# Patient Record
Sex: Female | Born: 1935 | Hispanic: No | Smoking: Never smoker
Health system: Southern US, Community
[De-identification: ages and names within clinical notes are randomized; demographics above are authoritative.]

## PROBLEM LIST (undated history)

## (undated) DIAGNOSIS — E785 Hyperlipidemia, unspecified: Secondary | ICD-10-CM

## (undated) DIAGNOSIS — E119 Type 2 diabetes mellitus without complications: Secondary | ICD-10-CM

## (undated) DIAGNOSIS — I63512 Cerebral infarction due to unspecified occlusion or stenosis of left middle cerebral artery: Secondary | ICD-10-CM

## (undated) DIAGNOSIS — I10 Essential (primary) hypertension: Secondary | ICD-10-CM

## (undated) DIAGNOSIS — M1990 Unspecified osteoarthritis, unspecified site: Secondary | ICD-10-CM

## (undated) HISTORY — PX: LIVER SURGERY: SHX698

## (undated) HISTORY — DX: Hyperlipidemia, unspecified: E78.5

## (undated) HISTORY — DX: Essential (primary) hypertension: I10

## (undated) HISTORY — DX: Type 2 diabetes mellitus without complications: E11.9

## (undated) HISTORY — DX: Unspecified osteoarthritis, unspecified site: M19.90

## (undated) HISTORY — DX: Cerebral infarction due to unspecified occlusion or stenosis of left middle cerebral artery: I63.512

---

## 2011-12-24 DIAGNOSIS — M159 Polyosteoarthritis, unspecified: Secondary | ICD-10-CM | POA: Diagnosis not present

## 2011-12-24 DIAGNOSIS — E119 Type 2 diabetes mellitus without complications: Secondary | ICD-10-CM | POA: Diagnosis not present

## 2012-01-30 DIAGNOSIS — H2510 Age-related nuclear cataract, unspecified eye: Secondary | ICD-10-CM | POA: Diagnosis not present

## 2012-01-30 DIAGNOSIS — E11329 Type 2 diabetes mellitus with mild nonproliferative diabetic retinopathy without macular edema: Secondary | ICD-10-CM | POA: Diagnosis not present

## 2012-01-30 DIAGNOSIS — E1139 Type 2 diabetes mellitus with other diabetic ophthalmic complication: Secondary | ICD-10-CM | POA: Diagnosis not present

## 2012-03-04 DIAGNOSIS — H40039 Anatomical narrow angle, unspecified eye: Secondary | ICD-10-CM | POA: Diagnosis not present

## 2012-03-04 DIAGNOSIS — H11159 Pinguecula, unspecified eye: Secondary | ICD-10-CM | POA: Diagnosis not present

## 2012-03-04 DIAGNOSIS — H25019 Cortical age-related cataract, unspecified eye: Secondary | ICD-10-CM | POA: Diagnosis not present

## 2012-03-04 DIAGNOSIS — H526 Other disorders of refraction: Secondary | ICD-10-CM | POA: Diagnosis not present

## 2012-03-04 DIAGNOSIS — H2510 Age-related nuclear cataract, unspecified eye: Secondary | ICD-10-CM | POA: Diagnosis not present

## 2012-03-13 DIAGNOSIS — I10 Essential (primary) hypertension: Secondary | ICD-10-CM | POA: Diagnosis not present

## 2012-03-13 DIAGNOSIS — I4510 Unspecified right bundle-branch block: Secondary | ICD-10-CM | POA: Diagnosis not present

## 2012-03-30 DIAGNOSIS — H2510 Age-related nuclear cataract, unspecified eye: Secondary | ICD-10-CM | POA: Diagnosis not present

## 2012-03-30 DIAGNOSIS — H25019 Cortical age-related cataract, unspecified eye: Secondary | ICD-10-CM | POA: Diagnosis not present

## 2012-03-30 DIAGNOSIS — H269 Unspecified cataract: Secondary | ICD-10-CM | POA: Diagnosis not present

## 2012-04-13 DIAGNOSIS — H52209 Unspecified astigmatism, unspecified eye: Secondary | ICD-10-CM | POA: Diagnosis not present

## 2012-04-13 DIAGNOSIS — H269 Unspecified cataract: Secondary | ICD-10-CM | POA: Diagnosis not present

## 2012-04-13 DIAGNOSIS — H25019 Cortical age-related cataract, unspecified eye: Secondary | ICD-10-CM | POA: Diagnosis not present

## 2012-04-13 DIAGNOSIS — H2510 Age-related nuclear cataract, unspecified eye: Secondary | ICD-10-CM | POA: Diagnosis not present

## 2012-04-13 DIAGNOSIS — K219 Gastro-esophageal reflux disease without esophagitis: Secondary | ICD-10-CM | POA: Diagnosis not present

## 2012-06-25 DIAGNOSIS — Z0000 Encounter for general adult medical examination without abnormal findings: Secondary | ICD-10-CM | POA: Diagnosis not present

## 2012-06-25 DIAGNOSIS — E119 Type 2 diabetes mellitus without complications: Secondary | ICD-10-CM | POA: Diagnosis not present

## 2012-06-25 DIAGNOSIS — M129 Arthropathy, unspecified: Secondary | ICD-10-CM | POA: Diagnosis not present

## 2012-08-10 DIAGNOSIS — E119 Type 2 diabetes mellitus without complications: Secondary | ICD-10-CM | POA: Diagnosis not present

## 2012-08-21 DIAGNOSIS — E119 Type 2 diabetes mellitus without complications: Secondary | ICD-10-CM | POA: Diagnosis not present

## 2012-08-21 DIAGNOSIS — R002 Palpitations: Secondary | ICD-10-CM | POA: Diagnosis not present

## 2012-08-25 DIAGNOSIS — R002 Palpitations: Secondary | ICD-10-CM | POA: Diagnosis not present

## 2012-08-27 DIAGNOSIS — R42 Dizziness and giddiness: Secondary | ICD-10-CM | POA: Diagnosis not present

## 2012-08-27 DIAGNOSIS — R002 Palpitations: Secondary | ICD-10-CM | POA: Diagnosis not present

## 2012-08-27 DIAGNOSIS — E119 Type 2 diabetes mellitus without complications: Secondary | ICD-10-CM | POA: Diagnosis not present

## 2012-08-27 DIAGNOSIS — I10 Essential (primary) hypertension: Secondary | ICD-10-CM | POA: Diagnosis not present

## 2012-08-27 DIAGNOSIS — R079 Chest pain, unspecified: Secondary | ICD-10-CM | POA: Diagnosis not present

## 2012-09-07 DIAGNOSIS — R0789 Other chest pain: Secondary | ICD-10-CM | POA: Diagnosis not present

## 2012-09-07 DIAGNOSIS — R002 Palpitations: Secondary | ICD-10-CM | POA: Diagnosis not present

## 2012-09-08 DIAGNOSIS — R0602 Shortness of breath: Secondary | ICD-10-CM | POA: Diagnosis not present

## 2012-09-08 DIAGNOSIS — R002 Palpitations: Secondary | ICD-10-CM | POA: Diagnosis not present

## 2012-09-08 DIAGNOSIS — R0789 Other chest pain: Secondary | ICD-10-CM | POA: Diagnosis not present

## 2012-09-11 ENCOUNTER — Ambulatory Visit: Payer: Self-pay | Admitting: Cardiovascular Disease

## 2012-09-26 DIAGNOSIS — Z23 Encounter for immunization: Secondary | ICD-10-CM | POA: Diagnosis not present

## 2012-09-29 DIAGNOSIS — R42 Dizziness and giddiness: Secondary | ICD-10-CM | POA: Diagnosis not present

## 2012-10-26 DIAGNOSIS — E119 Type 2 diabetes mellitus without complications: Secondary | ICD-10-CM | POA: Diagnosis not present

## 2012-12-25 DIAGNOSIS — E119 Type 2 diabetes mellitus without complications: Secondary | ICD-10-CM | POA: Diagnosis not present

## 2012-12-25 DIAGNOSIS — M79609 Pain in unspecified limb: Secondary | ICD-10-CM | POA: Diagnosis not present

## 2012-12-25 DIAGNOSIS — R634 Abnormal weight loss: Secondary | ICD-10-CM | POA: Diagnosis not present

## 2012-12-29 DIAGNOSIS — R634 Abnormal weight loss: Secondary | ICD-10-CM | POA: Diagnosis not present

## 2012-12-29 DIAGNOSIS — R718 Other abnormality of red blood cells: Secondary | ICD-10-CM | POA: Diagnosis not present

## 2012-12-29 DIAGNOSIS — R6889 Other general symptoms and signs: Secondary | ICD-10-CM | POA: Diagnosis not present

## 2013-01-21 DIAGNOSIS — M79609 Pain in unspecified limb: Secondary | ICD-10-CM | POA: Diagnosis not present

## 2013-01-21 DIAGNOSIS — E119 Type 2 diabetes mellitus without complications: Secondary | ICD-10-CM | POA: Diagnosis not present

## 2013-07-07 DIAGNOSIS — E785 Hyperlipidemia, unspecified: Secondary | ICD-10-CM | POA: Diagnosis not present

## 2013-07-07 DIAGNOSIS — E119 Type 2 diabetes mellitus without complications: Secondary | ICD-10-CM | POA: Diagnosis not present

## 2013-07-07 DIAGNOSIS — Z23 Encounter for immunization: Secondary | ICD-10-CM | POA: Diagnosis not present

## 2013-07-07 DIAGNOSIS — D649 Anemia, unspecified: Secondary | ICD-10-CM | POA: Diagnosis not present

## 2013-07-12 DIAGNOSIS — R634 Abnormal weight loss: Secondary | ICD-10-CM | POA: Diagnosis not present

## 2013-07-12 DIAGNOSIS — R718 Other abnormality of red blood cells: Secondary | ICD-10-CM | POA: Diagnosis not present

## 2013-07-12 DIAGNOSIS — D649 Anemia, unspecified: Secondary | ICD-10-CM | POA: Diagnosis not present

## 2013-07-12 DIAGNOSIS — E785 Hyperlipidemia, unspecified: Secondary | ICD-10-CM | POA: Diagnosis not present

## 2013-07-12 DIAGNOSIS — E119 Type 2 diabetes mellitus without complications: Secondary | ICD-10-CM | POA: Diagnosis not present

## 2013-07-12 DIAGNOSIS — R6889 Other general symptoms and signs: Secondary | ICD-10-CM | POA: Diagnosis not present

## 2013-09-09 DIAGNOSIS — M129 Arthropathy, unspecified: Secondary | ICD-10-CM | POA: Diagnosis not present

## 2013-09-09 DIAGNOSIS — E119 Type 2 diabetes mellitus without complications: Secondary | ICD-10-CM | POA: Diagnosis not present

## 2013-10-25 DIAGNOSIS — E119 Type 2 diabetes mellitus without complications: Secondary | ICD-10-CM | POA: Diagnosis not present

## 2014-03-08 DIAGNOSIS — M79609 Pain in unspecified limb: Secondary | ICD-10-CM | POA: Diagnosis not present

## 2014-03-18 DIAGNOSIS — B0089 Other herpesviral infection: Secondary | ICD-10-CM | POA: Diagnosis not present

## 2014-03-18 DIAGNOSIS — L02619 Cutaneous abscess of unspecified foot: Secondary | ICD-10-CM | POA: Diagnosis not present

## 2014-03-18 DIAGNOSIS — M25579 Pain in unspecified ankle and joints of unspecified foot: Secondary | ICD-10-CM | POA: Diagnosis not present

## 2014-03-18 DIAGNOSIS — L03119 Cellulitis of unspecified part of limb: Secondary | ICD-10-CM | POA: Diagnosis not present

## 2014-03-18 DIAGNOSIS — E119 Type 2 diabetes mellitus without complications: Secondary | ICD-10-CM | POA: Diagnosis not present

## 2014-03-18 DIAGNOSIS — M79609 Pain in unspecified limb: Secondary | ICD-10-CM | POA: Diagnosis not present

## 2014-05-10 DIAGNOSIS — Z9181 History of falling: Secondary | ICD-10-CM | POA: Diagnosis not present

## 2014-05-10 DIAGNOSIS — M129 Arthropathy, unspecified: Secondary | ICD-10-CM | POA: Diagnosis not present

## 2014-05-10 DIAGNOSIS — L97509 Non-pressure chronic ulcer of other part of unspecified foot with unspecified severity: Secondary | ICD-10-CM | POA: Diagnosis not present

## 2014-05-10 DIAGNOSIS — E119 Type 2 diabetes mellitus without complications: Secondary | ICD-10-CM | POA: Diagnosis not present

## 2014-05-10 LAB — HEMOGLOBIN A1C: Hgb A1c MFr Bld: 6.5 % — AB (ref 4.0–6.0)

## 2014-07-11 DIAGNOSIS — L02619 Cutaneous abscess of unspecified foot: Secondary | ICD-10-CM | POA: Diagnosis not present

## 2014-07-11 DIAGNOSIS — M25569 Pain in unspecified knee: Secondary | ICD-10-CM | POA: Diagnosis not present

## 2014-07-12 DIAGNOSIS — M2550 Pain in unspecified joint: Secondary | ICD-10-CM | POA: Diagnosis not present

## 2014-07-12 DIAGNOSIS — IMO0001 Reserved for inherently not codable concepts without codable children: Secondary | ICD-10-CM | POA: Diagnosis not present

## 2014-07-12 DIAGNOSIS — E559 Vitamin D deficiency, unspecified: Secondary | ICD-10-CM | POA: Diagnosis not present

## 2014-07-12 DIAGNOSIS — Z0000 Encounter for general adult medical examination without abnormal findings: Secondary | ICD-10-CM | POA: Diagnosis not present

## 2014-11-11 DIAGNOSIS — E118 Type 2 diabetes mellitus with unspecified complications: Secondary | ICD-10-CM | POA: Diagnosis not present

## 2014-11-11 DIAGNOSIS — I10 Essential (primary) hypertension: Secondary | ICD-10-CM | POA: Diagnosis not present

## 2015-04-15 DIAGNOSIS — M1990 Unspecified osteoarthritis, unspecified site: Secondary | ICD-10-CM | POA: Insufficient documentation

## 2015-04-15 DIAGNOSIS — R634 Abnormal weight loss: Secondary | ICD-10-CM | POA: Insufficient documentation

## 2015-04-15 DIAGNOSIS — E119 Type 2 diabetes mellitus without complications: Secondary | ICD-10-CM

## 2015-04-17 ENCOUNTER — Ambulatory Visit (INDEPENDENT_AMBULATORY_CARE_PROVIDER_SITE_OTHER): Payer: Medicare Other | Admitting: Family Medicine

## 2015-04-17 ENCOUNTER — Encounter: Payer: Self-pay | Admitting: Family Medicine

## 2015-04-17 ENCOUNTER — Ambulatory Visit: Payer: Self-pay | Admitting: Family Medicine

## 2015-04-17 VITALS — BP 155/60 | HR 60 | Temp 98.0°F | Resp 16 | Ht <= 58 in | Wt 109.6 lb

## 2015-04-17 DIAGNOSIS — M79609 Pain in unspecified limb: Secondary | ICD-10-CM | POA: Insufficient documentation

## 2015-04-17 DIAGNOSIS — E119 Type 2 diabetes mellitus without complications: Secondary | ICD-10-CM

## 2015-04-17 DIAGNOSIS — R718 Other abnormality of red blood cells: Secondary | ICD-10-CM | POA: Insufficient documentation

## 2015-04-17 DIAGNOSIS — L97509 Non-pressure chronic ulcer of other part of unspecified foot with unspecified severity: Secondary | ICD-10-CM | POA: Insufficient documentation

## 2015-04-17 DIAGNOSIS — I10 Essential (primary) hypertension: Secondary | ICD-10-CM

## 2015-04-17 LAB — CBC WITH DIFFERENTIAL/PLATELET
Basophils Absolute: 0 10*3/uL (ref 0.0–0.2)
Basos: 1 %
EOS (ABSOLUTE): 0 10*3/uL (ref 0.0–0.4)
Eos: 1 %
Hematocrit: 31.9 % — ABNORMAL LOW (ref 34.0–46.6)
Hemoglobin: 10.3 g/dL — ABNORMAL LOW (ref 11.1–15.9)
Immature Grans (Abs): 0 10*3/uL (ref 0.0–0.1)
Immature Granulocytes: 0 %
Lymphocytes Absolute: 1.7 10*3/uL (ref 0.7–3.1)
Lymphs: 43 %
MCH: 20.2 pg — ABNORMAL LOW (ref 26.6–33.0)
MCHC: 32.3 g/dL (ref 31.5–35.7)
MCV: 63 fL — ABNORMAL LOW (ref 79–97)
Monocytes Absolute: 0.3 10*3/uL (ref 0.1–0.9)
Monocytes: 6 %
Neutrophils Absolute: 2 10*3/uL (ref 1.4–7.0)
Neutrophils: 49 %
Platelets: 237 10*3/uL (ref 150–379)
RBC: 5.09 x10E6/uL (ref 3.77–5.28)
RDW: 17.2 % — ABNORMAL HIGH (ref 12.3–15.4)
WBC: 4.1 10*3/uL (ref 3.4–10.8)

## 2015-04-17 LAB — POCT GLYCOSYLATED HEMOGLOBIN (HGB A1C): Hemoglobin A1C: 6.1

## 2015-04-17 MED ORDER — LISINOPRIL 2.5 MG PO TABS: 2.5000 mg | ORAL_TABLET | Freq: Every day | ORAL | Status: DC

## 2015-04-17 NOTE — Progress Notes (Signed)
This encounter was created in error - please disregard.

## 2015-04-17 NOTE — Progress Notes (Signed)
Name: Gabriela Robles   MRN: [id number]    DOB: Jan 22, 1929   Date:04/17/2015       Progress Note  Subjective  Chief Complaint  Chief Complaint  Patient presents with  . Diabetes    Does not check at home.   . Leg Pain    Both knees    HPI  Here for f/u of T2DM.   She has no c/o.  She is [name].  Here with daughter.  Takes Fish Oil only.   Past Medical History  Diagnosis Date  . Arthritis   . Diabetes mellitus without complication     History reviewed. No pertinent past surgical history.  Family History  Problem Relation Age of Onset  . Family history unknown: Yes    History   Social History  . Marital Status: Unknown    Spouse Name: N/A  . Number of Children: N/A  . Years of Education: N/A   Occupational History  . Not on file.   Social History Main Topics  . Smoking status: Never Smoker   . Smokeless tobacco: Not on file  . Alcohol Use: No  . Drug Use: No  . Sexual Activity: Not on file   Other Topics Concern  . Not on file   Social History Narrative     Current outpatient prescriptions:  .  Omega-3 Fatty Acids (FISH OIL) 1000 MG CPDR, Take by mouth., Disp: , Rfl:  .  lisinopril (ZESTRIL) 2.5 MG tablet, Take 1 tablet (2.5 mg total) by mouth daily., Disp: 30 tablet, Rfl: 12 .  meloxicam (MOBIC) 7.5 MG tablet, Take by mouth., Disp: , Rfl:  .  MULTIPLE VITAMINS-MINERALS ER PO, Take by mouth., Disp: , Rfl:   No Known Allergies   Review of Systems  Constitutional: Negative.  Negative for fever, chills and malaise/fatigue.  HENT: Negative.   Eyes: Negative.  Negative for blurred vision and double vision.  Respiratory: Negative.  Negative for cough, shortness of breath and wheezing.   Cardiovascular: Negative.  Negative for chest pain, palpitations, orthopnea and leg swelling.  Gastrointestinal: Negative.  Negative for abdominal pain and blood in stool.  Genitourinary: Negative.  Negative for dysuria, urgency and frequency.  Musculoskeletal:  Negative.  Negative for myalgias and joint pain.  Skin: Negative.  Negative for rash.  Neurological: Negative for weakness and headaches.      Objective  Filed Vitals:   04/17/15 0928 04/17/15 0953  BP: 154/62 155/60  Pulse: 48 60  Temp: 98 F (36.7 C)   Resp: 16   Height: 4' 9.5" (1.461 m)   Weight: 109 lb 9.6 oz (49.714 kg)     Physical Exam  Constitutional: She is oriented to person, place, and time and well-developed, well-nourished, and in no distress. No distress.  HENT:  Head: Normocephalic and atraumatic.  Eyes: Conjunctivae and EOM are normal. Pupils are equal, round, and reactive to light. No scleral icterus.  Neck: Normal range of motion. Neck supple. No thyromegaly present.  Cardiovascular: Normal rate, regular rhythm, normal heart sounds and intact distal pulses.  Exam reveals no gallop and no friction rub.   No murmur heard. Pulmonary/Chest: Effort normal and breath sounds normal. No respiratory distress. She has no wheezes. She exhibits no tenderness.  Abdominal: Soft. Bowel sounds are normal. She exhibits no mass. There is no tenderness.  Musculoskeletal: She exhibits no edema.  Lymphadenopathy:    She has no cervical adenopathy.  Neurological: She is alert and oriented to person, place, and time.  Skin: Skin is warm and dry.  Vitals reviewed.        Assessment & Plan  Problem List Items Addressed This Visit      Cardiovascular and Mediastinum   Hypertension   Relevant Medications   lisinopril (ZESTRIL) 2.5 MG tablet   Other Relevant Orders   CBC with Differential     Endocrine   Diabetes - Primary   Relevant Medications   lisinopril (ZESTRIL) 2.5 MG tablet   Other Relevant Orders   POCT A1C (Completed)   Comprehensive Metabolic Panel (CMET)   Lipid Profile      Meds ordered this encounter  Medications         .       Marland Kitchen Omega-3 Fatty Acids (FISH OIL) 1000 MG CPDR    Sig: Take by mouth.  Marland Kitchen lisinopril (ZESTRIL) 2.5 MG tablet     Sig: Take 1 tablet (2.5 mg total) by mouth daily.    Dispense:  30 tablet    Refill:  12   1. Type 2 diabetes mellitus without complication  - POCT A1C - Comprehensive Metabolic Panel (CMET) - Lipid Profile  2. Essential hypertension  - lisinopril (ZESTRIL) 2.5 MG tablet; Take 1 tablet (2.5 mg total) by mouth daily.  Dispense: 30 tablet; Refill: 12 - CBC with Differential

## 2015-04-18 LAB — COMPREHENSIVE METABOLIC PANEL
ALT: 9 IU/L (ref 0–32)
AST: 15 IU/L (ref 0–40)
Albumin/Globulin Ratio: 1.6 (ref 1.1–2.5)
Albumin: 4 g/dL (ref 3.5–4.7)
Alkaline Phosphatase: 47 IU/L (ref 39–117)
BUN/Creatinine Ratio: 34 — ABNORMAL HIGH (ref 11–26)
BUN: 25 mg/dL (ref 8–27)
Bilirubin Total: 0.5 mg/dL (ref 0.0–1.2)
CO2: 25 mmol/L (ref 18–29)
Calcium: 9.2 mg/dL (ref 8.7–10.3)
Chloride: 103 mmol/L (ref 97–108)
Creatinine, Ser: 0.74 mg/dL (ref 0.57–1.00)
GFR calc Af Amer: 87 mL/min/{1.73_m2} (ref >59)
GFR calc non Af Amer: 75 mL/min/{1.73_m2} (ref >59)
Globulin, Total: 2.5 g/dL (ref 1.5–4.5)
Glucose: 96 mg/dL (ref 65–99)
Potassium: 4.3 mmol/L (ref 3.5–5.2)
Sodium: 143 mmol/L (ref 134–144)
Total Protein: 6.5 g/dL (ref 6.0–8.5)

## 2015-04-18 LAB — LIPID PANEL
Chol/HDL Ratio: 3.8 ratio units (ref 0.0–4.4)
Cholesterol, Total: 209 mg/dL — ABNORMAL HIGH (ref 100–199)
HDL: 55 mg/dL (ref >39)
LDL Calculated: 127 mg/dL — ABNORMAL HIGH (ref 0–99)
Triglycerides: 137 mg/dL (ref 0–149)
VLDL Cholesterol Cal: 27 mg/dL (ref 5–40)

## 2015-04-18 NOTE — Progress Notes (Signed)
Advised.JH  

## 2015-07-24 ENCOUNTER — Ambulatory Visit (INDEPENDENT_AMBULATORY_CARE_PROVIDER_SITE_OTHER): Payer: Medicare Other | Admitting: Family Medicine

## 2015-07-24 ENCOUNTER — Encounter: Payer: Self-pay | Admitting: Family Medicine

## 2015-07-24 VITALS — BP 145/65 | HR 51 | Resp 16 | Ht 60.0 in | Wt 112.0 lb

## 2015-07-24 DIAGNOSIS — D508 Other iron deficiency anemias: Secondary | ICD-10-CM | POA: Diagnosis not present

## 2015-07-24 DIAGNOSIS — D509 Iron deficiency anemia, unspecified: Secondary | ICD-10-CM | POA: Insufficient documentation

## 2015-07-24 DIAGNOSIS — E119 Type 2 diabetes mellitus without complications: Secondary | ICD-10-CM

## 2015-07-24 DIAGNOSIS — I10 Essential (primary) hypertension: Secondary | ICD-10-CM | POA: Diagnosis not present

## 2015-07-24 DIAGNOSIS — Z23 Encounter for immunization: Secondary | ICD-10-CM

## 2015-07-24 DIAGNOSIS — D649 Anemia, unspecified: Secondary | ICD-10-CM | POA: Insufficient documentation

## 2015-07-24 MED ORDER — FERROUS SULFATE 325 (65 FE) MG PO TABS: 325.0000 mg | ORAL_TABLET | Freq: Every day | ORAL | Status: DC

## 2015-07-24 MED ORDER — LISINOPRIL 2.5 MG PO TABS: 2.5000 mg | ORAL_TABLET | Freq: Every day | ORAL | Status: DC

## 2015-07-24 NOTE — Patient Instructions (Signed)
Take OTC Ferrous sulfate, 325 mg., 1 daily.  She has had flu shot today.

## 2015-07-24 NOTE — Progress Notes (Signed)
Name: Gabriela Robles   MRN: [id number]    DOB: 08/27/1929   Date:07/24/2015       Progress Note  Subjective  Chief Complaint  Chief Complaint  Patient presents with  . Hypertension  . Toe Pain    spot on 4th Metatarsal    HPI Here for f/u of HBP.  Has DM also, controlled with diet.  Has run out or her BP med x 2 months.  Was found to be anemic on labwork. Has a sore toe on R foot.  She is Gabriela Robles.  Here with son today as interpretor.  Declines outside interprator.  No problem-specific assessment & plan notes found for this encounter.   Past Medical History  Diagnosis Date  . Arthritis   . Diabetes mellitus without complication     Social History  Substance Use Topics  . Smoking status: Never Smoker   . Smokeless tobacco: Never Used  . Alcohol Use: No     Current outpatient prescriptions:  .  lisinopril (ZESTRIL) 2.5 MG tablet, Take 1 tablet (2.5 mg total) by mouth daily., Disp: 30 tablet, Rfl: 12 .  meloxicam (MOBIC) 7.5 MG tablet, Take by mouth., Disp: , Rfl:  .  MULTIPLE VITAMINS-MINERALS ER PO, Take by mouth., Disp: , Rfl:  .  Omega-3 Fatty Acids (FISH OIL) 1000 MG CPDR, Take by mouth., Disp: , Rfl:   No Known Allergies  Review of Systems  Constitutional: Negative for fever and chills.  Respiratory: Negative for cough, shortness of breath and wheezing.   Cardiovascular: Negative for chest pain, palpitations and leg swelling.  Gastrointestinal: Negative for heartburn, abdominal pain and blood in stool.  Genitourinary: Negative for dysuria, urgency and frequency.  Neurological: Negative for dizziness, sensory change and focal weakness.      Objective  Filed Vitals:   07/24/15 1056  BP: 150/71  Pulse: 51  Resp: 16  Height: 5' (1.524 m)  Weight: 112 lb (50.803 kg)     Physical Exam  Constitutional: She is well-developed, well-nourished, and in no distress. No distress.  HENT:  Head: Normocephalic and atraumatic.  Neck: Normal range of motion. Neck  supple. Carotid bruit is not present. No thyromegaly present.  Cardiovascular: Normal rate, regular rhythm, normal heart sounds and intact distal pulses.  Exam reveals no gallop and no friction rub.   No murmur heard. Pulmonary/Chest: Effort normal and breath sounds normal. No respiratory distress. She has no wheezes. She has no rales.  Musculoskeletal: She exhibits no edema.  Lymphadenopathy:    She has no cervical adenopathy.  Skin:  She has a corn on her R 4th toe.  No cellulitis.  Minimal tenderness.  Vitals reviewed.     No results found for this or any previous visit (from the past 2160 hour(s)).   Assessment & Plan  1. Essential hypertension  - lisinopril (ZESTRIL) 2.5 MG tablet; Take 1 tablet (2.5 mg total) by mouth daily.  Dispense: 30 tablet; Refill: 12  2. Need for influenza vaccination  - Flu vaccine HIGH DOSE PF (Fluzone High dose)  3. Other iron deficiency anemias -OTC Fe SO4, 325 mg., 1 daily  4. Type 2 diabetes mellitus without complication Cont. Diet control

## 2015-09-12 ENCOUNTER — Telehealth: Payer: Self-pay | Admitting: *Deleted

## 2015-09-12 DIAGNOSIS — R2990 Unspecified symptoms and signs involving the nervous system: Secondary | ICD-10-CM | POA: Diagnosis not present

## 2015-09-12 DIAGNOSIS — R531 Weakness: Secondary | ICD-10-CM | POA: Diagnosis not present

## 2015-09-12 DIAGNOSIS — E785 Hyperlipidemia, unspecified: Secondary | ICD-10-CM | POA: Diagnosis present

## 2015-09-12 DIAGNOSIS — R4701 Aphasia: Secondary | ICD-10-CM | POA: Diagnosis not present

## 2015-09-12 DIAGNOSIS — I69320 Aphasia following cerebral infarction: Secondary | ICD-10-CM | POA: Diagnosis not present

## 2015-09-12 DIAGNOSIS — K5900 Constipation, unspecified: Secondary | ICD-10-CM | POA: Diagnosis present

## 2015-09-12 DIAGNOSIS — D509 Iron deficiency anemia, unspecified: Secondary | ICD-10-CM | POA: Diagnosis not present

## 2015-09-12 DIAGNOSIS — R29705 NIHSS score 5: Secondary | ICD-10-CM | POA: Diagnosis present

## 2015-09-12 DIAGNOSIS — R4781 Slurred speech: Secondary | ICD-10-CM | POA: Diagnosis not present

## 2015-09-12 DIAGNOSIS — G8191 Hemiplegia, unspecified affecting right dominant side: Secondary | ICD-10-CM | POA: Diagnosis not present

## 2015-09-12 DIAGNOSIS — Z8673 Personal history of transient ischemic attack (TIA), and cerebral infarction without residual deficits: Secondary | ICD-10-CM | POA: Insufficient documentation

## 2015-09-12 DIAGNOSIS — R471 Dysarthria and anarthria: Secondary | ICD-10-CM | POA: Diagnosis present

## 2015-09-12 DIAGNOSIS — I63512 Cerebral infarction due to unspecified occlusion or stenosis of left middle cerebral artery: Secondary | ICD-10-CM | POA: Diagnosis not present

## 2015-09-12 DIAGNOSIS — I639 Cerebral infarction, unspecified: Secondary | ICD-10-CM | POA: Diagnosis not present

## 2015-09-12 DIAGNOSIS — I10 Essential (primary) hypertension: Secondary | ICD-10-CM | POA: Diagnosis not present

## 2015-09-12 DIAGNOSIS — I4510 Unspecified right bundle-branch block: Secondary | ICD-10-CM | POA: Diagnosis present

## 2015-09-12 DIAGNOSIS — M6281 Muscle weakness (generalized): Secondary | ICD-10-CM | POA: Diagnosis not present

## 2015-09-12 DIAGNOSIS — I63412 Cerebral infarction due to embolism of left middle cerebral artery: Secondary | ICD-10-CM | POA: Diagnosis not present

## 2015-09-12 DIAGNOSIS — E119 Type 2 diabetes mellitus without complications: Secondary | ICD-10-CM | POA: Diagnosis not present

## 2015-09-12 DIAGNOSIS — R29810 Facial weakness: Secondary | ICD-10-CM | POA: Diagnosis not present

## 2015-09-12 DIAGNOSIS — I517 Cardiomegaly: Secondary | ICD-10-CM | POA: Diagnosis not present

## 2015-09-12 DIAGNOSIS — E782 Mixed hyperlipidemia: Secondary | ICD-10-CM | POA: Diagnosis not present

## 2015-09-12 NOTE — Telephone Encounter (Signed)
Patient was brought in and EMS called. B186/[id number]  P 66 Temp 98.1 02-99 %

## 2015-09-14 DIAGNOSIS — IMO0002 Reserved for concepts with insufficient information to code with codable children: Secondary | ICD-10-CM | POA: Insufficient documentation

## 2015-09-14 DIAGNOSIS — E1140 Type 2 diabetes mellitus with diabetic neuropathy, unspecified: Secondary | ICD-10-CM | POA: Insufficient documentation

## 2015-09-14 DIAGNOSIS — E785 Hyperlipidemia, unspecified: Secondary | ICD-10-CM | POA: Insufficient documentation

## 2015-09-14 DIAGNOSIS — I10 Essential (primary) hypertension: Secondary | ICD-10-CM | POA: Insufficient documentation

## 2015-09-15 DIAGNOSIS — I639 Cerebral infarction, unspecified: Secondary | ICD-10-CM | POA: Diagnosis not present

## 2015-09-16 DIAGNOSIS — I69320 Aphasia following cerebral infarction: Secondary | ICD-10-CM | POA: Diagnosis not present

## 2015-09-16 DIAGNOSIS — I69351 Hemiplegia and hemiparesis following cerebral infarction affecting right dominant side: Secondary | ICD-10-CM | POA: Diagnosis not present

## 2015-09-19 ENCOUNTER — Ambulatory Visit (INDEPENDENT_AMBULATORY_CARE_PROVIDER_SITE_OTHER): Payer: Medicare Other | Admitting: Family Medicine

## 2015-09-19 ENCOUNTER — Encounter: Payer: Self-pay | Admitting: Family Medicine

## 2015-09-19 VITALS — BP 151/64 | HR 58 | Temp 97.4°F | Resp 16

## 2015-09-19 DIAGNOSIS — L02619 Cutaneous abscess of unspecified foot: Secondary | ICD-10-CM | POA: Insufficient documentation

## 2015-09-19 DIAGNOSIS — L03031 Cellulitis of right toe: Secondary | ICD-10-CM | POA: Diagnosis not present

## 2015-09-19 DIAGNOSIS — L0291 Cutaneous abscess, unspecified: Secondary | ICD-10-CM

## 2015-09-19 DIAGNOSIS — L02611 Cutaneous abscess of right foot: Secondary | ICD-10-CM | POA: Diagnosis not present

## 2015-09-19 DIAGNOSIS — I69351 Hemiplegia and hemiparesis following cerebral infarction affecting right dominant side: Secondary | ICD-10-CM | POA: Diagnosis not present

## 2015-09-19 DIAGNOSIS — L0390 Cellulitis, unspecified: Secondary | ICD-10-CM | POA: Diagnosis not present

## 2015-09-19 DIAGNOSIS — I69320 Aphasia following cerebral infarction: Secondary | ICD-10-CM | POA: Diagnosis not present

## 2015-09-19 DIAGNOSIS — L03039 Cellulitis of unspecified toe: Secondary | ICD-10-CM

## 2015-09-19 MED ORDER — DOXYCYCLINE HYCLATE 100 MG PO TABS: 100.0000 mg | ORAL_TABLET | Freq: Two times a day (BID) | ORAL | Status: DC

## 2015-09-19 NOTE — Progress Notes (Signed)
Name: Gabriela Robles   MRN: [id number]    DOB: 12/04/1928   Date:09/19/2015       Progress Note  Subjective  Chief Complaint  Chief Complaint  Patient presents with  . Foot Swelling    toe infection 4 th digit right foot.    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  Found at home to have a swollen,. Red R foot with infected R 4th toe.  Has had infection in this tow in past (>1 yr ago).  Is having Home health PT and OT and social work consults. No problem-specific assessment & plan notes found for this encounter.   Past Medical History  Diagnosis Date  . Arthritis   . Diabetes mellitus without complication [facility](HCC)     Social History  Substance Use Topics  . Smoking status: Never Smoker   . Smokeless tobacco: Never Used  . Alcohol Use: No     Current outpatient prescriptions:  .  aspirin 81 MG chewable tablet, Chew 81 mg by mouth daily., Disp: , Rfl:  .  atorvastatin (LIPITOR) 80 MG tablet, Take 80 mg by mouth at bedtime., Disp: , Rfl: 0 .  ferrous sulfate 325 (65 FE) MG tablet, Take 1 tablet (325 mg total) by mouth daily with breakfast., Disp: 30 tablet, Rfl: 12 .  lisinopril (ZESTRIL) 2.5 MG tablet, Take 1 tablet (2.5 mg total) by mouth daily., Disp: 30 tablet, Rfl: 12 .  meloxicam (MOBIC) 7.5 MG tablet, Take by mouth., Disp: , Rfl:  .  MULTIPLE VITAMINS-MINERALS ER PO, Take by mouth., Disp: , Rfl:  .  Omega-3 Fatty Acids (FISH OIL) 1000 MG CPDR, Take by mouth., Disp: , Rfl:  .  [facility] ASPIRIN ADULT LOW DOSE 81 MG chewable tablet, Chew 81 mg by mouth daily., Disp: , Rfl: 0  No Known Allergies  Review of Systems  Constitutional: Negative for fever and chills.  Respiratory: Negative for cough, shortness of breath and wheezing.   Cardiovascular: Negative for chest pain, palpitations and leg swelling.  Gastrointestinal: Negative for heartburn, nausea, abdominal pain and constipation.  Genitourinary: Negative for dysuria, urgency and  frequency.  Skin:       abscess and cellulitis R 4th toe.  Neurological: Negative for dizziness.      Objective  Filed Vitals:   09/19/15 1111  BP: 151/64  Pulse: 58  Temp: 97.4 F (36.3 C)  TempSrc: Oral  Resp: 16     Physical Exam  Constitutional: She is oriented to person, place, and time.  Thin, weak looking elderl;y female. In a wheelchair.  Neurological: She is alert and oriented to person, place, and time.  Skin:  Small abscess or R 4th toe with swelling and redness of toe and R lateral and superior foot.      No results found for this or any previous visit (from the past 2160 hour(s)).   Assessment & Plan  1. Pyogenic abscess  - doxycycline (VIBRA-TABS) 100 MG tablet; Take 1 tablet (100 mg total) by mouth 2 (two) times daily.  Dispense: 20 tablet; Refill: 0  2. Cellulitis and abscess of toe, right -soak foot/toe in warm water with Epsom salts 3-4 times a day .  - doxycycline (VIBRA-TABS) 100 MG tablet; Take 1 tablet (100 mg total) by mouth 2 (two) times daily.  Dispense: 20 tablet; Refill: 0

## 2015-09-19 NOTE — Patient Instructions (Signed)
Have PT monitor foot ant home.  Return if worsening

## 2015-09-20 ENCOUNTER — Other Ambulatory Visit: Payer: Self-pay | Admitting: Family Medicine

## 2015-09-20 ENCOUNTER — Telehealth: Payer: Self-pay | Admitting: Family Medicine

## 2015-09-20 DIAGNOSIS — I69351 Hemiplegia and hemiparesis following cerebral infarction affecting right dominant side: Secondary | ICD-10-CM | POA: Diagnosis not present

## 2015-09-20 DIAGNOSIS — I69354 Hemiplegia and hemiparesis following cerebral infarction affecting left non-dominant side: Secondary | ICD-10-CM

## 2015-09-20 DIAGNOSIS — I69320 Aphasia following cerebral infarction: Secondary | ICD-10-CM | POA: Diagnosis not present

## 2015-09-20 NOTE — Telephone Encounter (Signed)
Called Terrie and gave verbal for nurse also ordered hospital bed, walker with platform thru [facility] per Dr. Juanetta GoslingHawkins. He will sign off on 485 plan of care forms.

## 2015-09-20 NOTE — Telephone Encounter (Signed)
Agree-jh 

## 2015-09-20 NOTE — Telephone Encounter (Signed)
Aurther Lofterry, physical therapist from [facility] is recommending a rolling walker with platform for upper extremity and said if Dr. Juanetta GoslingHawkins agrees, he can fax a written order to [facility].  [facility] has not received a request from [facility]'s [facility] bed.  She asked if he would be willing to fax an order for the bed too.  She hasn't been able to contact the dr at [facility] about the 485 forms.  Would he consider filling out the forms?  She would like to have a nurse to come to home to help assist pt.  If he agrees to that, she would like to get a verbal for nurse to start next week.  Her call back number is [phone]

## 2015-09-22 DIAGNOSIS — I69351 Hemiplegia and hemiparesis following cerebral infarction affecting right dominant side: Secondary | ICD-10-CM | POA: Diagnosis not present

## 2015-09-22 DIAGNOSIS — I69320 Aphasia following cerebral infarction: Secondary | ICD-10-CM | POA: Diagnosis not present

## 2015-09-25 ENCOUNTER — Telehealth: Payer: Self-pay | Admitting: Family Medicine

## 2015-09-25 DIAGNOSIS — I69320 Aphasia following cerebral infarction: Secondary | ICD-10-CM | POA: Diagnosis not present

## 2015-09-25 DIAGNOSIS — I69351 Hemiplegia and hemiparesis following cerebral infarction affecting right dominant side: Secondary | ICD-10-CM | POA: Diagnosis not present

## 2015-09-25 NOTE — Telephone Encounter (Signed)
Donnita FallsKim Pollock, speech therapist with [facility] went for an evaluation with pt.  She needs a verbal for 4 visits.  Her call back number is [phone]

## 2015-09-25 NOTE — Telephone Encounter (Signed)
Ok for four visits.

## 2015-09-26 NOTE — Telephone Encounter (Signed)
Done.[place] 

## 2015-09-28 ENCOUNTER — Telehealth: Payer: Self-pay | Admitting: *Deleted

## 2015-09-28 NOTE — Telephone Encounter (Signed)
-----   Message from Theador HawthorneAnne Sweetser sent at 09/22/2015 12:01 PM EST ----- Regarding: Hospital Bed [facility] Hope you had a Happy Thanksgiving!! Can you get the MD to add the narrative part of the hospital bed to his office visit note.  Insurance now requires that it be separate form there order.  If you all would rather fill out a form and fax it in, let me know and I will fax to you.  Thanks [employer]

## 2015-09-28 NOTE — Telephone Encounter (Signed)
Done

## 2015-09-29 DIAGNOSIS — I69351 Hemiplegia and hemiparesis following cerebral infarction affecting right dominant side: Secondary | ICD-10-CM | POA: Diagnosis not present

## 2015-09-29 DIAGNOSIS — I69320 Aphasia following cerebral infarction: Secondary | ICD-10-CM | POA: Diagnosis not present

## 2015-10-02 ENCOUNTER — Encounter: Payer: Self-pay | Admitting: Family Medicine

## 2015-10-02 ENCOUNTER — Ambulatory Visit (INDEPENDENT_AMBULATORY_CARE_PROVIDER_SITE_OTHER): Payer: Medicare Other | Admitting: Family Medicine

## 2015-10-02 VITALS — BP 156/64 | HR 51 | Temp 97.7°F | Resp 16 | Ht 60.0 in | Wt 108.8 lb

## 2015-10-02 DIAGNOSIS — E785 Hyperlipidemia, unspecified: Secondary | ICD-10-CM | POA: Diagnosis not present

## 2015-10-02 DIAGNOSIS — I639 Cerebral infarction, unspecified: Secondary | ICD-10-CM | POA: Diagnosis not present

## 2015-10-02 DIAGNOSIS — E119 Type 2 diabetes mellitus without complications: Secondary | ICD-10-CM | POA: Diagnosis not present

## 2015-10-02 DIAGNOSIS — I10 Essential (primary) hypertension: Secondary | ICD-10-CM

## 2015-10-02 DIAGNOSIS — I69351 Hemiplegia and hemiparesis following cerebral infarction affecting right dominant side: Secondary | ICD-10-CM | POA: Diagnosis not present

## 2015-10-02 DIAGNOSIS — I69320 Aphasia following cerebral infarction: Secondary | ICD-10-CM | POA: Diagnosis not present

## 2015-10-02 LAB — POCT GLYCOSYLATED HEMOGLOBIN (HGB A1C): Hemoglobin A1C: 6.8

## 2015-10-02 MED ORDER — LISINOPRIL 5 MG PO TABS: 5.0000 mg | ORAL_TABLET | Freq: Every day | ORAL | Status: DC

## 2015-10-02 NOTE — Progress Notes (Signed)
Name: Gabriela Robles   MRN: [id number]    DOB: Nov 25, 1928   Date:10/02/2015       Progress Note  Subjective  Chief Complaint  Chief Complaint  Patient presents with  . Hospitalization Follow-up    stroke    HPI Here for f/u of hospitalization for L sided CVA with R hemiparesis (arm/hand/leg).  Gait is s;lowly improving.  Her R hand is still  Weak and with flexion contracture.  She is not using the specialized walker that was ordered.  Her daughter did not want her to become dependent on it.  Uses a cane.  Still c.o some pain in R 4th toe, but cellulitis has resolved. No problem-specific assessment & plan notes found for this encounter.   Past Medical History  Diagnosis Date  . Arthritis   . Diabetes mellitus without complication (HCC)     History reviewed. No pertinent past surgical history.  Family History  Problem Relation Age of Onset  . Family history unknown: Yes    Social History   Social History  . Marital Status: Unknown    Spouse Name: N/A  . Number of Children: N/A  . Years of Education: N/A   Occupational History  . Not on file.   Social History Main Topics  . Smoking status: Never Smoker   . Smokeless tobacco: Never Used  . Alcohol Use: No  . Drug Use: No  . Sexual Activity: Not on file   Other Topics Concern  . Not on file   Social History Narrative     Current outpatient prescriptions:  .  aspirin 81 MG chewable tablet, Chew 81 mg by mouth daily., Disp: , Rfl:  .  atorvastatin (LIPITOR) 80 MG tablet, Take 80 mg by mouth at bedtime., Disp: , Rfl: 0 .  [facility] ASPIRIN ADULT LOW DOSE 81 MG chewable tablet, Chew 81 mg by mouth daily., Disp: , Rfl: 0 .  doxycycline (VIBRA-TABS) 100 MG tablet, Take 1 tablet (100 mg total) by mouth 2 (two) times daily., Disp: 20 tablet, Rfl: 0 .  ferrous sulfate 325 (65 FE) MG tablet, Take 1 tablet (325 mg total) by mouth daily with breakfast., Disp: 30 tablet, Rfl: 12 .  meloxicam (MOBIC) 7.5 MG tablet, Take by  mouth., Disp: , Rfl:  .  MULTIPLE VITAMINS-MINERALS ER PO, Take by mouth., Disp: , Rfl:  .  Omega-3 Fatty Acids (FISH OIL) 1000 MG CPDR, Take by mouth., Disp: , Rfl:  .  lisinopril (PRINIVIL,ZESTRIL) 5 MG tablet, Take 1 tablet (5 mg total) by mouth daily., Disp: 90 tablet, Rfl: 3  No Known Allergies   Review of Systems  Constitutional: Negative for fever and chills.  Respiratory: Negative for cough, shortness of breath and wheezing.   Cardiovascular: Negative for chest pain, palpitations and leg swelling.  Gastrointestinal: Negative for heartburn, nausea, vomiting, abdominal pain and blood in stool.  Genitourinary: Negative for dysuria, urgency and frequency.  Musculoskeletal:       R 4th toe pain  Neurological: Positive for focal weakness (R hand/arm/leg) and weakness. Negative for dizziness.      Objective  Filed Vitals:   10/02/15 0900  BP: 156/64  Pulse: 51  Temp: 97.7 F (36.5 C)  TempSrc: Oral  Resp: 16  Height: 5' (1.524 m)  Weight: 108 lb 12.8 oz (49.351 kg)    Physical Exam  Constitutional: She is oriented to person, place, and time. No distress.  HENT:  Head: Normocephalic and atraumatic.  Neck: Normal range of motion.  Neck supple. Carotid bruit is not present. No thyromegaly present.  Cardiovascular: Normal rate, regular rhythm and normal heart sounds.  Exam reveals no gallop and no friction rub.   No murmur heard. Pulmonary/Chest: Effort normal and breath sounds normal. No respiratory distress. She has no wheezes. She has no rales.  Abdominal: Bowel sounds are normal. She exhibits no mass. There is no tenderness.  Musculoskeletal: She exhibits no edema.  Lymphadenopathy:    She has no cervical adenopathy.  Neurological: She is alert and oriented to person, place, and time.  Weakness of R hand and arm.  Shuffling gait R leg.  Skin:  Granuloma to R 4th toe.  No cellulitis.  Not hot.  Minimally tender.  Psychiatric: Affect normal.       No results  found for this or any previous visit (from the past 2160 hour(s)).   Assessment & Plan  Problem List Items Addressed This Visit      Cardiovascular and Mediastinum   Hypertension - Primary   Relevant Medications   lisinopril (PRINIVIL,ZESTRIL) 5 MG tablet   Ischemic stroke (HCC)   Relevant Medications   lisinopril (PRINIVIL,ZESTRIL) 5 MG tablet     Endocrine   Diabetes (HCC)   Relevant Medications   lisinopril (PRINIVIL,ZESTRIL) 5 MG tablet   Other Relevant Orders   POCT HgB A1C     Other   HLD (hyperlipidemia)   Relevant Medications   lisinopril (PRINIVIL,ZESTRIL) 5 MG tablet      Meds ordered this encounter  Medications  . lisinopril (PRINIVIL,ZESTRIL) 5 MG tablet    Sig: Take 1 tablet (5 mg total) by mouth daily.    Dispense:  90 tablet    Refill:  3   1. Essential hypertension Increase Lisinopril to 5 mg each day.  2. Ischemic stroke (HCC) Continue exercises  3. HLD (hyperlipidemia)   4. Type 2 diabetes mellitus without complication, without long-term current use of insulin (HCC)  - POCT HgB A1C-6.8

## 2015-10-02 NOTE — Patient Instructions (Signed)
Advised getting special walker that was ordered for stability.  Use corn plaster on toe granuloma.

## 2015-10-03 DIAGNOSIS — I69320 Aphasia following cerebral infarction: Secondary | ICD-10-CM | POA: Diagnosis not present

## 2015-10-03 DIAGNOSIS — I69351 Hemiplegia and hemiparesis following cerebral infarction affecting right dominant side: Secondary | ICD-10-CM | POA: Diagnosis not present

## 2015-10-05 DIAGNOSIS — I63512 Cerebral infarction due to unspecified occlusion or stenosis of left middle cerebral artery: Secondary | ICD-10-CM | POA: Diagnosis not present

## 2015-10-06 DIAGNOSIS — I69351 Hemiplegia and hemiparesis following cerebral infarction affecting right dominant side: Secondary | ICD-10-CM | POA: Diagnosis not present

## 2015-10-06 DIAGNOSIS — I69320 Aphasia following cerebral infarction: Secondary | ICD-10-CM | POA: Diagnosis not present

## 2015-10-09 DIAGNOSIS — I69320 Aphasia following cerebral infarction: Secondary | ICD-10-CM | POA: Diagnosis not present

## 2015-10-09 DIAGNOSIS — I69351 Hemiplegia and hemiparesis following cerebral infarction affecting right dominant side: Secondary | ICD-10-CM | POA: Diagnosis not present

## 2015-10-12 DIAGNOSIS — I69320 Aphasia following cerebral infarction: Secondary | ICD-10-CM | POA: Diagnosis not present

## 2015-10-12 DIAGNOSIS — I69351 Hemiplegia and hemiparesis following cerebral infarction affecting right dominant side: Secondary | ICD-10-CM | POA: Diagnosis not present

## 2015-10-16 DIAGNOSIS — I69351 Hemiplegia and hemiparesis following cerebral infarction affecting right dominant side: Secondary | ICD-10-CM | POA: Diagnosis not present

## 2015-10-16 DIAGNOSIS — I69320 Aphasia following cerebral infarction: Secondary | ICD-10-CM | POA: Diagnosis not present

## 2015-10-18 DIAGNOSIS — I69351 Hemiplegia and hemiparesis following cerebral infarction affecting right dominant side: Secondary | ICD-10-CM | POA: Diagnosis not present

## 2015-10-18 DIAGNOSIS — I69320 Aphasia following cerebral infarction: Secondary | ICD-10-CM | POA: Diagnosis not present

## 2015-10-19 DIAGNOSIS — I69351 Hemiplegia and hemiparesis following cerebral infarction affecting right dominant side: Secondary | ICD-10-CM | POA: Diagnosis not present

## 2015-10-19 DIAGNOSIS — I69320 Aphasia following cerebral infarction: Secondary | ICD-10-CM | POA: Diagnosis not present

## 2015-10-29 DIAGNOSIS — Z8673 Personal history of transient ischemic attack (TIA), and cerebral infarction without residual deficits: Secondary | ICD-10-CM

## 2015-10-29 HISTORY — DX: Personal history of transient ischemic attack (TIA), and cerebral infarction without residual deficits: Z86.73

## 2015-10-31 DIAGNOSIS — I6930 Unspecified sequelae of cerebral infarction: Secondary | ICD-10-CM | POA: Insufficient documentation

## 2015-11-06 ENCOUNTER — Ambulatory Visit: Payer: Self-pay | Admitting: Family Medicine

## 2015-11-06 ENCOUNTER — Other Ambulatory Visit: Payer: Self-pay | Admitting: Family Medicine

## 2015-11-09 ENCOUNTER — Telehealth: Payer: Self-pay | Admitting: Family Medicine

## 2015-11-09 NOTE — Telephone Encounter (Signed)
Pt needs a refill on [facility] [place]

## 2015-11-09 NOTE — Telephone Encounter (Signed)
Called [facility] and confirmed refills on ferrous sulfate. They will order and call patient when ready.

## 2015-11-20 ENCOUNTER — Ambulatory Visit (INDEPENDENT_AMBULATORY_CARE_PROVIDER_SITE_OTHER): Payer: Medicare Other | Admitting: Family Medicine

## 2015-11-20 VITALS — BP 125/74 | HR 66 | Temp 98.0°F | Resp 16 | Ht 60.0 in | Wt 111.6 lb

## 2015-11-20 DIAGNOSIS — R202 Paresthesia of skin: Secondary | ICD-10-CM | POA: Diagnosis not present

## 2015-11-20 DIAGNOSIS — E1142 Type 2 diabetes mellitus with diabetic polyneuropathy: Secondary | ICD-10-CM | POA: Diagnosis not present

## 2015-11-20 DIAGNOSIS — D509 Iron deficiency anemia, unspecified: Secondary | ICD-10-CM | POA: Diagnosis not present

## 2015-11-20 DIAGNOSIS — L03116 Cellulitis of left lower limb: Secondary | ICD-10-CM

## 2015-11-20 MED ORDER — CEPHALEXIN 500 MG PO CAPS: 500.0000 mg | ORAL_CAPSULE | Freq: Two times a day (BID) | ORAL | Status: AC

## 2015-11-20 MED ORDER — GABAPENTIN 100 MG PO CAPS: ORAL_CAPSULE | ORAL | Status: DC

## 2015-11-20 MED ORDER — ALPHA-LIPOIC ACID 600 MG PO CAPS: 1.0000 | ORAL_CAPSULE | Freq: Every day | ORAL | Status: DC

## 2015-11-20 NOTE — Assessment & Plan Note (Signed)
Check anemia profile today to determine if iron therapy is effective.

## 2015-11-20 NOTE — Patient Instructions (Signed)
I think she has a mild infection in her foot. We will treat that with antibiotics- twice daily for 7 days.   For the pain and burning- let's Alpha Lipoic Acid-  once daily.

## 2015-11-20 NOTE — Assessment & Plan Note (Signed)
Pain is likely due to diabetic peripheral neuropathy since symptoms began prior to infection. Trial of alpha lipoic acid to see if symptoms improve.  Consider low dose of gabapentin in the future.

## 2015-11-20 NOTE — Progress Notes (Signed)
Subjective:    Patient ID: Gabriela Robles, female    DOB: 07/13/1929, 82 y.o.   MRN: [id number]  HPI: Gabriela Robles is a 82 y.o. female presenting on 11/20/2015 for Foot Swelling   HPI  Pt presents for foot swelling in the L foot. Began on Saturday night. SHe was previously treated for foot infection in November.  Swelling has not decreased or improved since Saturday. Foot is red and swollen. No injury for trauma to the foot. No fevers at home. Foot is painful and burns up to the calf. Pt is able to ambulate without pain. Pain is described as a 8/10. Home treatment: Epsom salt soaks. Salt soaks relieve the pain.  Pt reports numbness and burning in the toes is present on a daily basis.   Pt does not speak english. Her son is translating for this visit. The patient and son were offered an interpreter through the [facility] language line and declined.   Past Medical History  Diagnosis Date  . Arthritis   . Diabetes mellitus without complication [facility])     Current Outpatient Prescriptions on File Prior to Visit  Medication Sig  . aspirin 81 MG chewable tablet Chew 81 mg by mouth daily.  Marland Kitchen atorvastatin (LIPITOR) 80 MG tablet Take 80 mg by mouth at bedtime.  . [facility] ASPIRIN ADULT LOW DOSE 81 MG chewable tablet Chew 81 mg by mouth daily.  Marland Kitchen doxycycline (VIBRA-TABS) 100 MG tablet TAKE 1 TABLET TWICE DAILY WITH FOOD  . ferrous sulfate 325 (65 FE) MG tablet Take 1 tablet (325 mg total) by mouth daily with breakfast.  . lisinopril (PRINIVIL,ZESTRIL) 5 MG tablet Take 1 tablet (5 mg total) by mouth daily.  . meloxicam (MOBIC) 7.5 MG tablet Take by mouth.  . MULTIPLE VITAMINS-MINERALS ER PO Take by mouth.  . Omega-3 Fatty Acids (FISH OIL) 1000 MG CPDR Take by mouth.   No current facility-administered medications on file prior to visit.    Review of Systems  Constitutional: Negative for fever and chills.  HENT: Negative.   Respiratory: Negative for cough, chest tightness and wheezing.    Cardiovascular: Negative for chest pain and leg swelling.  Gastrointestinal: Negative for nausea, vomiting, abdominal pain, diarrhea and constipation.  Endocrine: Negative.  Negative for cold intolerance, heat intolerance, polydipsia, polyphagia and polyuria.  Genitourinary: Negative for dysuria and difficulty urinating.  Musculoskeletal: Positive for joint swelling and arthralgias.  Neurological: Positive for weakness (2/2 stroke in 08/2015. ) and numbness (paresthesia L/R foot in the stocking pattern. ). Negative for dizziness and light-headedness.  Psychiatric/Behavioral: Negative.    Per HPI unless specifically indicated above     Objective:    BP 125/74 mmHg  Pulse 66  Temp(Src) 98 F (36.7 C) (Oral)  Resp 16  Ht 5' (1.524 m)  Wt 111 lb 9.6 oz (50.621 kg)  BMI 21.80 kg/m2  Wt Readings from Last 3 Encounters:  11/20/15 111 lb 9.6 oz (50.621 kg)  10/02/15 108 lb 12.8 oz (49.351 kg)  07/24/15 112 lb (50.803 kg)    Physical Exam  Constitutional: She is oriented to person, place, and time. She appears well-developed and well-nourished.  HENT:  Head: Normocephalic and atraumatic.  Neck: Neck supple.  Cardiovascular: Normal rate, regular rhythm and normal heart sounds.  Exam reveals no gallop and no friction rub.   No murmur heard. Pulmonary/Chest: Effort normal and breath sounds normal. She has no wheezes. She exhibits no tenderness.  Abdominal: Soft. Normal appearance and bowel sounds are normal.  She exhibits no distension and no mass. There is no tenderness. There is no rebound and no guarding.  Musculoskeletal: Normal range of motion. She exhibits no edema or tenderness.  Lymphadenopathy:    She has no cervical adenopathy.  Neurological: She is alert and oriented to person, place, and time. She displays no tremor. A sensory deficit (pt is unable to feel bilateral plantar surface of the feet. ) is present. She displays no seizure activity.  R sided weakness- RUE, RLE.  Shuffling gait.   Skin: Skin is warm and dry.   Diabetic Foot Exam - Simple   Simple Foot Form  Diabetic Foot exam was performed with the following findings:  Yes 11/20/2015 11:45 AM  Visual Inspection  No deformities, no ulcerations, no other skin breakdown bilaterally:  Yes  Sensation Testing  See comments:  Yes  Pulse Check  See comments:  Yes  Comments  Unsure if patient understood exam due to language barrier. +1 pulses. Redness and swelling L foot. Diminished sensation to monofilament.       Results for orders placed or performed in visit on 10/02/15  POCT HgB A1C  Result Value Ref Range   Hemoglobin A1C 6.8%       Assessment & Plan:   Problem List Items Addressed This Visit      Endocrine   Diabetic peripheral neuropathy (HCC)    Pain is likely due to diabetic peripheral neuropathy since symptoms began prior to infection. Trial of alpha lipoic acid to see if symptoms improve.  Consider low dose of gabapentin in the future.       Relevant Medications   Alpha-Lipoic Acid 600 MG CAPS     Other   Anemia    Check anemia profile today to determine if iron therapy is effective.       Relevant Orders   Anemia Profile A    Other Visit Diagnoses    Cellulitis of left foot    -  Primary    Treat with kelfex BID for 7 days. Encouraged elevation of the foot. Alarm symptoms reviewed.     Relevant Medications    cephALEXin (KEFLEX) 500 MG capsule    Other Relevant Orders    Basic Metabolic Panel (BMET)    Paresthesia of both feet        Check B12- but symptoms are likely due to her diabetes.     Relevant Orders    Vitamin B12       Meds ordered this encounter  Medications  . cephALEXin (KEFLEX) 500 MG capsule    Sig: Take 1 capsule (500 mg total) by mouth 2 (two) times daily.    Dispense:  14 capsule    Refill:  0    Order Specific Question:  Supervising Provider    Answer:  Janeann Forehand [[id number]]  . DISCONTD: gabapentin (NEURONTIN) 100 MG capsule     Sig: Take 1 capsule at bedtime, can increase to 1 capsule  AM and one at bedtime after 1 week. Then increase to three times daily after 2 weeks    Dispense:  90 capsule    Refill:  3    Order Specific Question:  Supervising Provider    Answer:  Janeann Forehand [[id number]]  . Alpha-Lipoic Acid 600 MG CAPS    Sig: Take 1 capsule (600 mg total) by mouth daily.    Dispense:  30 each    Refill:  11    Order Specific Question:  Supervising Provider    Answer:  Janeann Forehand [[id number]]      Follow up plan: Return in about 3 weeks (around 12/11/2015) for foot pain. Marland Kitchen

## 2015-11-21 DIAGNOSIS — L03116 Cellulitis of left lower limb: Secondary | ICD-10-CM | POA: Diagnosis not present

## 2015-11-21 DIAGNOSIS — D509 Iron deficiency anemia, unspecified: Secondary | ICD-10-CM | POA: Diagnosis not present

## 2015-11-21 DIAGNOSIS — R202 Paresthesia of skin: Secondary | ICD-10-CM | POA: Diagnosis not present

## 2015-11-22 LAB — ANEMIA PROFILE A
Basophils Absolute: 0 10*3/uL (ref 0.0–0.2)
Basos: 0 %
EOS (ABSOLUTE): 0.1 10*3/uL (ref 0.0–0.4)
Eos: 2 %
Hematocrit: 25.6 % — ABNORMAL LOW (ref 34.0–46.6)
Hemoglobin: 8.3 g/dL — ABNORMAL LOW (ref 11.1–15.9)
Immature Grans (Abs): 0 10*3/uL (ref 0.0–0.1)
Immature Granulocytes: 0 %
Iron Saturation: 39 % (ref 15–55)
Iron: 87 ug/dL (ref 27–139)
Lymphocytes Absolute: 1.5 10*3/uL (ref 0.7–3.1)
Lymphs: 32 %
MCH: 20 pg — ABNORMAL LOW (ref 26.6–33.0)
MCHC: 32.4 g/dL (ref 31.5–35.7)
MCV: 62 fL — ABNORMAL LOW (ref 79–97)
Monocytes Absolute: 0.2 10*3/uL (ref 0.1–0.9)
Monocytes: 5 %
Neutrophils Absolute: 2.9 10*3/uL (ref 1.4–7.0)
Neutrophils: 61 %
Platelets: 253 10*3/uL (ref 150–379)
RBC: 4.14 x10E6/uL (ref 3.77–5.28)
RDW: 16.5 % — ABNORMAL HIGH (ref 12.3–15.4)
Retic Ct Pct: 2.4 % (ref 0.6–2.6)
Total Iron Binding Capacity: 222 ug/dL — ABNORMAL LOW (ref 250–450)
UIBC: 135 ug/dL (ref 118–369)
WBC: 4.7 10*3/uL (ref 3.4–10.8)

## 2015-11-22 LAB — BASIC METABOLIC PANEL
BUN/Creatinine Ratio: 33 — ABNORMAL HIGH (ref 11–26)
BUN: 31 mg/dL — ABNORMAL HIGH (ref 8–27)
CO2: 22 mmol/L (ref 18–29)
Calcium: 9 mg/dL (ref 8.7–10.3)
Chloride: 100 mmol/L (ref 96–106)
Creatinine, Ser: 0.94 mg/dL (ref 0.57–1.00)
GFR calc Af Amer: 64 mL/min/{1.73_m2} (ref >59)
GFR calc non Af Amer: 56 mL/min/{1.73_m2} — ABNORMAL LOW (ref >59)
Glucose: 107 mg/dL — ABNORMAL HIGH (ref 65–99)
Potassium: 4 mmol/L (ref 3.5–5.2)
Sodium: 138 mmol/L (ref 134–144)

## 2015-11-22 LAB — VITAMIN B12: Vitamin B-12: 595 pg/mL (ref 211–946)

## 2015-11-23 ENCOUNTER — Ambulatory Visit: Payer: Medicare Other | Admitting: Family Medicine

## 2015-11-23 ENCOUNTER — Telehealth: Payer: Self-pay | Admitting: *Deleted

## 2015-11-23 NOTE — Telephone Encounter (Signed)
Called to discuss lab results.[place]

## 2015-12-12 DIAGNOSIS — I10 Essential (primary) hypertension: Secondary | ICD-10-CM | POA: Diagnosis not present

## 2015-12-12 DIAGNOSIS — E782 Mixed hyperlipidemia: Secondary | ICD-10-CM | POA: Diagnosis not present

## 2015-12-12 DIAGNOSIS — E119 Type 2 diabetes mellitus without complications: Secondary | ICD-10-CM | POA: Diagnosis not present

## 2015-12-12 DIAGNOSIS — Z8673 Personal history of transient ischemic attack (TIA), and cerebral infarction without residual deficits: Secondary | ICD-10-CM | POA: Diagnosis not present

## 2015-12-14 ENCOUNTER — Other Ambulatory Visit: Payer: Self-pay | Admitting: Family Medicine

## 2016-01-30 ENCOUNTER — Ambulatory Visit
Admission: RE | Admit: 2016-01-30 | Discharge: 2016-01-30 | Disposition: A | Discharge status: Home / Self Care | Payer: Medicare Other | Source: Ambulatory Visit | Attending: Family Medicine | Admitting: Family Medicine

## 2016-01-30 ENCOUNTER — Ambulatory Visit (INDEPENDENT_AMBULATORY_CARE_PROVIDER_SITE_OTHER): Payer: Medicare Other | Admitting: Family Medicine

## 2016-01-30 ENCOUNTER — Encounter: Payer: Self-pay | Admitting: Family Medicine

## 2016-01-30 VITALS — BP 112/49 | HR 57 | Temp 97.7°F | Resp 16 | Ht 60.0 in | Wt 108.6 lb

## 2016-01-30 DIAGNOSIS — M79671 Pain in right foot: Secondary | ICD-10-CM | POA: Diagnosis not present

## 2016-01-30 DIAGNOSIS — M7989 Other specified soft tissue disorders: Secondary | ICD-10-CM | POA: Insufficient documentation

## 2016-01-30 DIAGNOSIS — L03115 Cellulitis of right lower limb: Secondary | ICD-10-CM | POA: Diagnosis not present

## 2016-01-30 DIAGNOSIS — M1000 Idiopathic gout, unspecified site: Secondary | ICD-10-CM | POA: Diagnosis not present

## 2016-01-30 MED ORDER — PREDNISONE 10 MG PO TABS: ORAL_TABLET | ORAL | Status: DC

## 2016-01-30 MED ORDER — CEPHALEXIN 500 MG PO CAPS: 500.0000 mg | ORAL_CAPSULE | Freq: Four times a day (QID) | ORAL | Status: AC

## 2016-01-30 NOTE — Progress Notes (Signed)
Name: Gabriela Robles   MRN: [id number]    DOB: 1929-09-10   Date:01/30/2016       Progress Note  Subjective  Chief Complaint  Chief Complaint  Patient presents with  . Foot Swelling    Onset Friday evening, she hit her R foot with the door to the bathroom, then it began to swell. Her foot is warm to the touch, red, painful and swollen.     HPI Here for R foot swelling and pain.  She bumped her R 4th toe after the foot started bothering her.  It is red, swollen, warm and very painful.  First episode ever.  No problem-specific assessment & plan notes found for this encounter.   Past Medical History  Diagnosis Date  . Arthritis   . Diabetes mellitus without complication [facility](HCC)     Social History  Substance Use Topics  . Smoking status: Never Smoker   . Smokeless tobacco: Never Used  . Alcohol Use: No     Current outpatient prescriptions:  .  Alpha-Lipoic Acid 600 MG CAPS, Take 1 capsule (600 mg total) by mouth daily., Disp: 30 each, Rfl: 11 .  aspirin 81 MG chewable tablet, Chew 81 mg by mouth daily., Disp: , Rfl:  .  atorvastatin (LIPITOR) 80 MG tablet, TAKE 1 TABLET (80 MG TOTAL) BY MOUTH ONCE DAILY., Disp: 90 tablet, Rfl: 3 .  [facility]S ASPIRIN ADULT LOW DOSE 81 MG chewable tablet, Chew 81 mg by mouth daily., Disp: , Rfl: 0 .  doxycycline (VIBRA-TABS) 100 MG tablet, TAKE 1 TABLET TWICE DAILY WITH FOOD, Disp: 20 tablet, Rfl: 0 .  ferrous sulfate 325 (65 FE) MG tablet, Take 1 tablet (325 mg total) by mouth daily with breakfast., Disp: 30 tablet, Rfl: 12 .  lisinopril (PRINIVIL,ZESTRIL) 5 MG tablet, Take 1 tablet (5 mg total) by mouth daily., Disp: 90 tablet, Rfl: 3 .  meloxicam (MOBIC) 7.5 MG tablet, Take by mouth., Disp: , Rfl:  .  MULTIPLE VITAMINS-MINERALS ER PO, Take by mouth., Disp: , Rfl:  .  Omega-3 Fatty Acids (FISH OIL) 1000 MG CPDR, Take by mouth., Disp: , Rfl:   No Known Allergies  Review of Systems  Constitutional: Negative for fever, chills, weight loss and  malaise/fatigue.  HENT: Negative for hearing loss.   Eyes: Negative for blurred vision and double vision.  Respiratory: Negative for cough, shortness of breath and wheezing.   Cardiovascular: Negative for chest pain, palpitations and leg swelling.  Gastrointestinal: Negative for heartburn, abdominal pain and blood in stool.  Genitourinary: Negative for dysuria, urgency and frequency.  Musculoskeletal: Positive for joint pain.       R first MP joint red, swollen, hot and very painful to touch and pain with  ROM.  R lateral foot is red ans swollen  And warm and very tender to touch.  She has a healed callous on R 4th toe and that area is sl. Swollen.  Skin: Negative for rash.  Neurological: Negative for weakness and headaches.      Objective  Filed Vitals:   01/30/16 0948  BP: 112/49  Pulse: 57  Temp: 97.7 F (36.5 C)  TempSrc: Oral  Resp: 16  Height: 5' (1.524 m)  Weight: 108 lb 9.6 oz (49.261 kg)  SpO2: 100%     Physical Exam  Constitutional: She is oriented to person, place, and time. She appears distressed (difficulty wwalking due to pain in L foot.).  Musculoskeletal:  L first MP joint very red swollen, warm and  tender.  Pain with ROM of joint.  Tenderness and swelling and warmth of L lateral foot, esp at L 4th toe.    Xray fo foot with joint swelling of 1st and 4th toes and lat. Foot swelling.  No fractures or evidence of osteomyelitis.  Neurological: She is alert and oriented to person, place, and time.      Recent Results (from the past 2160 hour(s))  Anemia Profile A     Status: Abnormal   Collection Time: 11/21/15  9:25 AM  Result Value Ref Range   Total Iron Binding Capacity 222 (L) 250 - 450 ug/dL   UIBC 161 096 - 045 ug/dL   Iron 87 27 - 409 ug/dL   Iron Saturation 39 15 - 55 %   WBC 4.7 3.4 - 10.8 x10E3/uL   RBC 4.14 3.77 - 5.28 x10E6/uL   Hemoglobin 8.3 (L) 11.1 - 15.9 g/dL   Hematocrit 81.1 (L) 91.4 - 46.6 %   MCV 62 (L) 79 - 97 fL   MCH 20.0  (L) 26.6 - 33.0 pg   MCHC 32.4 31.5 - 35.7 g/dL   RDW 78.2 (H) 95.6 - 21.3 %   Platelets 253 150 - 379 x10E3/uL   Neutrophils 61 %   Lymphs 32 %   Monocytes 5 %   Eos 2 %   Basos 0 %   Neutrophils Absolute 2.9 1.4 - 7.0 x10E3/uL   Lymphocytes Absolute 1.5 0.7 - 3.1 x10E3/uL   Monocytes Absolute 0.2 0.1 - 0.9 x10E3/uL   EOS (ABSOLUTE) 0.1 0.0 - 0.4 x10E3/uL   Basophils Absolute 0.0 0.0 - 0.2 x10E3/uL   Immature Granulocytes 0 %   Immature Grans (Abs) 0.0 0.0 - 0.1 x10E3/uL   Retic Ct Pct 2.4 0.6 - 2.6 %  Vitamin B12     Status: None   Collection Time: 11/21/15  9:25 AM  Result Value Ref Range   Vitamin B-12 595 211 - 946 pg/mL  Basic Metabolic Panel (BMET)     Status: Abnormal   Collection Time: 11/21/15  9:25 AM  Result Value Ref Range   Glucose 107 (H) 65 - 99 mg/dL   BUN 31 (H) 8 - 27 mg/dL   Creatinine, Ser 0.86 0.57 - 1.00 mg/dL   GFR calc non Af Amer 56 (L) >59 mL/min/1.73   GFR calc Af Amer 64 >59 mL/min/1.73   BUN/Creatinine Ratio 33 (H) 11 - 26   Sodium 138 134 - 144 mmol/L   Potassium 4.0 3.5 - 5.2 mmol/L   Chloride 100 96 - 106 mmol/L   CO2 22 18 - 29 mmol/L   Calcium 9.0 8.7 - 10.3 mg/dL     Assessment & Plan  1. Right foot pain  - DG Foot Complete Right; Future RTC 2 days 2. Acute idiopathic gout, unspecified site  - predniSONE (DELTASONE) 10 MG tablet; Take 3 daily for 6 days.  Dispense: 18 tablet; Refill: 0 - Uric acid  3. Cellulitis of right lower extremity  - cephALEXin (KEFLEX) 500 MG capsule; Take 1 capsule (500 mg total) by mouth 4 (four) times daily.  Dispense: 40 capsule; Refill: 0 - CBC with Differential

## 2016-01-31 LAB — CBC WITH DIFFERENTIAL/PLATELET
Basophils Absolute: 0 10*3/uL (ref 0.0–0.2)
Basos: 0 %
EOS (ABSOLUTE): 0.1 10*3/uL (ref 0.0–0.4)
Eos: 1 %
Hematocrit: 27 % — ABNORMAL LOW (ref 34.0–46.6)
Hemoglobin: 8.4 g/dL — ABNORMAL LOW (ref 11.1–15.9)
Immature Grans (Abs): 0 10*3/uL (ref 0.0–0.1)
Immature Granulocytes: 0 %
Lymphocytes Absolute: 1.8 10*3/uL (ref 0.7–3.1)
Lymphs: 34 %
MCH: 20.5 pg — ABNORMAL LOW (ref 26.6–33.0)
MCHC: 31.1 g/dL — ABNORMAL LOW (ref 31.5–35.7)
MCV: 66 fL — ABNORMAL LOW (ref 79–97)
Monocytes Absolute: 0.4 10*3/uL (ref 0.1–0.9)
Monocytes: 7 %
Neutrophils Absolute: 3 10*3/uL (ref 1.4–7.0)
Neutrophils: 58 %
Platelets: 233 10*3/uL (ref 150–379)
RBC: 4.09 x10E6/uL (ref 3.77–5.28)
RDW: 17 % — ABNORMAL HIGH (ref 12.3–15.4)
WBC: 5.2 10*3/uL (ref 3.4–10.8)

## 2016-01-31 LAB — URIC ACID: Uric Acid: 7.9 mg/dL — ABNORMAL HIGH (ref 2.5–7.1)

## 2016-02-01 ENCOUNTER — Ambulatory Visit (INDEPENDENT_AMBULATORY_CARE_PROVIDER_SITE_OTHER): Payer: Medicare Other | Admitting: Family Medicine

## 2016-02-01 ENCOUNTER — Encounter: Payer: Self-pay | Admitting: Family Medicine

## 2016-02-01 VITALS — BP 125/53 | HR 60 | Temp 97.8°F | Resp 16 | Wt 111.0 lb

## 2016-02-01 DIAGNOSIS — M109 Gout, unspecified: Secondary | ICD-10-CM | POA: Insufficient documentation

## 2016-02-01 DIAGNOSIS — M1000 Idiopathic gout, unspecified site: Secondary | ICD-10-CM | POA: Diagnosis not present

## 2016-02-01 MED ORDER — NAPROXEN 375 MG PO TABS: 375.0000 mg | ORAL_TABLET | Freq: Two times a day (BID) | ORAL | Status: DC

## 2016-02-01 NOTE — Progress Notes (Signed)
Name: Gabriela Robles   MRN: [id number]    DOB: 14-Mar-1929   Date:02/01/2016       Progress Note  Subjective  Chief Complaint  Chief Complaint  Patient presents with  . Foot Swelling    Follow up    HPI Here for f/o of R foot p[ain.  It has gotten much better over past 2 days.  She is ambulating with a cane today without problems.  No problem-specific assessment & plan notes found for this encounter.   Past Medical History  Diagnosis Date  . Arthritis   . Diabetes mellitus without complication [facility](HCC)     Social History  Substance Use Topics  . Smoking status: Never Smoker   . Smokeless tobacco: Never Used  . Alcohol Use: No     Current outpatient prescriptions:  .  Alpha-Lipoic Acid 600 MG CAPS, Take 1 capsule (600 mg total) by mouth daily., Disp: 30 each, Rfl: 11 .  aspirin 81 MG chewable tablet, Chew 81 mg by mouth daily., Disp: , Rfl:  .  atorvastatin (LIPITOR) 80 MG tablet, TAKE 1 TABLET (80 MG TOTAL) BY MOUTH ONCE DAILY., Disp: 90 tablet, Rfl: 3 .  cephALEXin (KEFLEX) 500 MG capsule, Take 1 capsule (500 mg total) by mouth 4 (four) times daily., Disp: 40 capsule, Rfl: 0 .  [employer] ASPIRIN ADULT LOW DOSE 81 MG chewable tablet, Chew 81 mg by mouth daily., Disp: , Rfl: 0 .  doxycycline (VIBRA-TABS) 100 MG tablet, TAKE 1 TABLET TWICE DAILY WITH FOOD, Disp: 20 tablet, Rfl: 0 .  ferrous sulfate 325 (65 FE) MG tablet, Take 1 tablet (325 mg total) by mouth daily with breakfast., Disp: 30 tablet, Rfl: 12 .  lisinopril (PRINIVIL,ZESTRIL) 5 MG tablet, Take 1 tablet (5 mg total) by mouth daily., Disp: 90 tablet, Rfl: 3 .  meloxicam (MOBIC) 7.5 MG tablet, Take by mouth., Disp: , Rfl:  .  MULTIPLE VITAMINS-MINERALS ER PO, Take by mouth., Disp: , Rfl:  .  Omega-3 Fatty Acids (FISH OIL) 1000 MG CPDR, Take by mouth., Disp: , Rfl:  .  predniSONE (DELTASONE) 10 MG tablet, Take 3 daily for 6 days., Disp: 18 tablet, Rfl: 0  Not on File  Review of Systems  Constitutional: Negative for  fever, chills and malaise/fatigue.  Musculoskeletal: Positive for joint pain (R foot, getting much better.).  Neurological: Negative for weakness.      Objective  Filed Vitals:   02/01/16 0909  BP: 125/53  Pulse: 55  Temp: 97.8 F (36.6 C)  TempSrc: Oral  Resp: 16  Weight: 111 lb (50.349 kg)  SpO2: 100%     Physical Exam  Constitutional: She is oriented to person, place, and time and well-developed, well-nourished, and in no distress. No distress.  HENT:  Head: Normocephalic and atraumatic.  Cardiovascular: Normal rate, regular rhythm and normal heart sounds.   Musculoskeletal:  R first MP joint is a little ten der still, but much improved.  Minimal redness.  No heat.  Rest of R foot not swollen any longer and no tenderness or heat.  Uric acid level 7.9.  CBC wnl.  Neurological: She is alert and oriented to person, place, and time.  Vitals reviewed.     Recent Results (from the past 2160 hour(s))  Anemia Profile A     Status: Abnormal   Collection Time: 11/21/15  9:25 AM  Result Value Ref Range   Total Iron Binding Capacity 222 (L) 250 - 450 ug/dL   UIBC 409135 811118 -  369 ug/dL   Iron 87 27 - 657 ug/dL   Iron Saturation 39 15 - 55 %   WBC 4.7 3.4 - 10.8 x10E3/uL   RBC 4.14 3.77 - 5.28 x10E6/uL   Hemoglobin 8.3 (L) 11.1 - 15.9 g/dL   Hematocrit 84.6 (L) 96.2 - 46.6 %   MCV 62 (L) 79 - 97 fL   MCH 20.0 (L) 26.6 - 33.0 pg   MCHC 32.4 31.5 - 35.7 g/dL   RDW 95.2 (H) 84.1 - 32.4 %   Platelets 253 150 - 379 x10E3/uL   Neutrophils 61 %   Lymphs 32 %   Monocytes 5 %   Eos 2 %   Basos 0 %   Neutrophils Absolute 2.9 1.4 - 7.0 x10E3/uL   Lymphocytes Absolute 1.5 0.7 - 3.1 x10E3/uL   Monocytes Absolute 0.2 0.1 - 0.9 x10E3/uL   EOS (ABSOLUTE) 0.1 0.0 - 0.4 x10E3/uL   Basophils Absolute 0.0 0.0 - 0.2 x10E3/uL   Immature Granulocytes 0 %   Immature Grans (Abs) 0.0 0.0 - 0.1 x10E3/uL   Retic Ct Pct 2.4 0.6 - 2.6 %  Vitamin B12     Status: None   Collection Time: 11/21/15   9:25 AM  Result Value Ref Range   Vitamin B-12 595 211 - 946 pg/mL  Basic Metabolic Panel (BMET)     Status: Abnormal   Collection Time: 11/21/15  9:25 AM  Result Value Ref Range   Glucose 107 (H) 65 - 99 mg/dL   BUN 31 (H) 8 - 27 mg/dL   Creatinine, Ser 4.01 0.57 - 1.00 mg/dL   GFR calc non Af Amer 56 (L) >59 mL/min/1.73   GFR calc Af Amer 64 >59 mL/min/1.73   BUN/Creatinine Ratio 33 (H) 11 - 26   Sodium 138 134 - 144 mmol/L   Potassium 4.0 3.5 - 5.2 mmol/L   Chloride 100 96 - 106 mmol/L   CO2 22 18 - 29 mmol/L   Calcium 9.0 8.7 - 10.3 mg/dL  CBC with Differential     Status: Abnormal   Collection Time: 01/30/16  2:01 PM  Result Value Ref Range   WBC 5.2 3.4 - 10.8 x10E3/uL   RBC 4.09 3.77 - 5.28 x10E6/uL   Hemoglobin 8.4 (L) 11.1 - 15.9 g/dL   Hematocrit 02.7 (L) 25.3 - 46.6 %   MCV 66 (L) 79 - 97 fL   MCH 20.5 (L) 26.6 - 33.0 pg   MCHC 31.1 (L) 31.5 - 35.7 g/dL   RDW 66.4 (H) 40.3 - 47.4 %   Platelets 233 150 - 379 x10E3/uL   Neutrophils 58 %   Lymphs 34 %   Monocytes 7 %   Eos 1 %   Basos 0 %   Neutrophils Absolute 3.0 1.4 - 7.0 x10E3/uL   Lymphocytes Absolute 1.8 0.7 - 3.1 x10E3/uL   Monocytes Absolute 0.4 0.1 - 0.9 x10E3/uL   EOS (ABSOLUTE) 0.1 0.0 - 0.4 x10E3/uL   Basophils Absolute 0.0 0.0 - 0.2 x10E3/uL   Immature Granulocytes 0 %   Immature Grans (Abs) 0.0 0.0 - 0.1 x10E3/uL  Uric acid     Status: Abnormal   Collection Time: 01/30/16  2:01 PM  Result Value Ref Range   Uric Acid 7.9 (H) 2.5 - 7.1 mg/dL    Comment:            Therapeutic target for gout patients: <6.0     Assessment & Plan  1. Idiopathic gout, unspecified chronicity, unspecified site  -  naproxen (NAPROSYN) 375 MG tablet; Take 1 tablet (375 mg total) by mouth 2 (two) times daily with a meal. Take as needed for joint pain.  Dispense: 60 tablet; Refill: 3

## 2016-10-30 ENCOUNTER — Ambulatory Visit: Payer: Medicare Other | Admitting: Family Medicine

## 2016-10-31 ENCOUNTER — Encounter (INDEPENDENT_AMBULATORY_CARE_PROVIDER_SITE_OTHER): Payer: Self-pay

## 2016-10-31 ENCOUNTER — Encounter: Payer: Self-pay | Admitting: Family Medicine

## 2016-10-31 ENCOUNTER — Ambulatory Visit (INDEPENDENT_AMBULATORY_CARE_PROVIDER_SITE_OTHER): Payer: Medicare Other | Admitting: Family Medicine

## 2016-10-31 VITALS — BP 144/53 | HR 62 | Temp 97.6°F | Resp 16 | Ht 60.0 in | Wt 117.0 lb

## 2016-10-31 DIAGNOSIS — M1000 Idiopathic gout, unspecified site: Secondary | ICD-10-CM

## 2016-10-31 DIAGNOSIS — I10 Essential (primary) hypertension: Secondary | ICD-10-CM

## 2016-10-31 DIAGNOSIS — E119 Type 2 diabetes mellitus without complications: Secondary | ICD-10-CM | POA: Diagnosis not present

## 2016-10-31 DIAGNOSIS — E785 Hyperlipidemia, unspecified: Secondary | ICD-10-CM | POA: Diagnosis not present

## 2016-10-31 DIAGNOSIS — E1142 Type 2 diabetes mellitus with diabetic polyneuropathy: Secondary | ICD-10-CM | POA: Diagnosis not present

## 2016-10-31 MED ORDER — INDOMETHACIN 25 MG PO CAPS: 25.0000 mg | ORAL_CAPSULE | Freq: Three times a day (TID) | ORAL | 3 refills | Status: DC

## 2016-10-31 MED ORDER — LISINOPRIL 5 MG PO TABS: 5.0000 mg | ORAL_TABLET | Freq: Every day | ORAL | 3 refills | Status: DC

## 2016-10-31 MED ORDER — ATORVASTATIN CALCIUM 80 MG PO TABS: ORAL_TABLET | ORAL | 3 refills | Status: DC

## 2016-10-31 MED ORDER — ALPHA-LIPOIC ACID 600 MG PO CAPS: 1.0000 | ORAL_CAPSULE | Freq: Every day | ORAL | 11 refills | Status: DC

## 2016-10-31 NOTE — Progress Notes (Signed)
Name: Gabriela Robles   MRN: [id number]    DOB: 09-Apr-1929   Date:10/31/2016       Progress Note  Subjective  Chief Complaint  Chief Complaint  Patient presents with  . Hypertension  . Gout    HPI Here to refill HBP and Lipid meds.  She is out of them, but her daughter is not sure how  Long.  Also needs Alpha Lipoic acid for neuropathy.  Has acute gout flair x 2 days of R 3rd finger.  Painful and swollen.  Has not had f/u of BP or DM or lip[ids iin quite some time.  No problem-specific Assessment & Plan notes found for this encounter.   Past Medical History:  Diagnosis Date  . Arthritis   . Diabetes mellitus without complication (HCC)     History reviewed. No pertinent surgical history.  Family History  Problem Relation Age of Onset  . Family history unknown: Yes    Social History   Social History  . Marital status: Unknown    Spouse name: N/A  . Number of children: N/A  . Years of education: N/A   Occupational History  . Not on file.   Social History Main Topics  . Smoking status: Never Smoker  . Smokeless tobacco: Never Used  . Alcohol use No  . Drug use: No  . Sexual activity: Not on file   Other Topics Concern  . Not on file   Social History Narrative  . No narrative on file     Current Outpatient Prescriptions:  .  Alpha-Lipoic Acid 600 MG CAPS, Take 1 capsule (600 mg total) by mouth daily., Disp: 30 each, Rfl: 11 .  atorvastatin (LIPITOR) 80 MG tablet, TAKE 1 TABLET (80 MG TOTAL) BY MOUTH ONCE DAILY., Disp: 90 tablet, Rfl: 3 .  [employer] ASPIRIN ADULT LOW DOSE 81 MG chewable tablet, Chew 81 mg by mouth daily., Disp: , Rfl: 0 .  ferrous sulfate 325 (65 FE) MG tablet, Take 1 tablet (325 mg total) by mouth daily with breakfast., Disp: 30 tablet, Rfl: 12 .  lisinopril (PRINIVIL,ZESTRIL) 5 MG tablet, Take 1 tablet (5 mg total) by mouth daily., Disp: 90 tablet, Rfl: 3 .  MULTIPLE VITAMINS-MINERALS ER PO, Take by mouth., Disp: , Rfl:  .  Omega-3 Fatty  Acids (FISH OIL) 1000 MG CPDR, Take by mouth., Disp: , Rfl:  .  indomethacin (INDOCIN) 25 MG capsule, Take 1 capsule (25 mg total) by mouth 3 (three) times daily with meals. Take as needed for gout flair, Disp: 30 capsule, Rfl: 3  Not on File   Review of Systems  Constitutional: Negative for chills, fever, malaise/fatigue and weight loss.  HENT: Negative for hearing loss and tinnitus.   Eyes: Negative for blurred vision and double vision.  Respiratory: Negative for cough, shortness of breath and wheezing.   Cardiovascular: Negative for chest pain and leg swelling.  Gastrointestinal: Negative for abdominal pain, blood in stool and heartburn.  Genitourinary: Negative for dysuria, frequency and urgency.  Musculoskeletal: Positive for joint pain (R 3rd finger (PIP joint)).  Skin: Negative for rash.  Neurological: Negative for weakness.      Objective  Vitals:   10/31/16 1443  BP: (!) 144/53  Pulse: 62  Resp: 16  Temp: 97.6 F (36.4 C)  TempSrc: Oral  Weight: 117 lb (53.1 kg)  Height: 5' (1.524 m)    Physical Exam  Constitutional: She is oriented to person, place, and time. She appears distressed (In pain).  HENT:  Head: Normocephalic and atraumatic.  Cardiovascular: Normal rate, regular rhythm and normal heart sounds.   Pulmonary/Chest: Effort normal and breath sounds normal.  Musculoskeletal:  R 3rd finger PIP joint red, swollen, hot and exquisitely tender to touch and movement.  Neurological: She is alert and oriented to person, place, and time.       No results found for this or any previous visit (from the past 2160 hour(s)).   Assessment & Plan  Problem List Items Addressed This Visit      Cardiovascular and Mediastinum   Essential (primary) hypertension   Relevant Medications   atorvastatin (LIPITOR) 80 MG tablet   lisinopril (PRINIVIL,ZESTRIL) 5 MG tablet     Endocrine   Controlled type 2 diabetes mellitus without complication (HCC)   Relevant  Medications   atorvastatin (LIPITOR) 80 MG tablet   lisinopril (PRINIVIL,ZESTRIL) 5 MG tablet   Diabetic peripheral neuropathy (HCC)   Relevant Medications   Alpha-Lipoic Acid 600 MG CAPS   atorvastatin (LIPITOR) 80 MG tablet   lisinopril (PRINIVIL,ZESTRIL) 5 MG tablet     Other   HLD (hyperlipidemia)   Relevant Medications   atorvastatin (LIPITOR) 80 MG tablet   lisinopril (PRINIVIL,ZESTRIL) 5 MG tablet   Gout - Primary   Relevant Medications   indomethacin (INDOCIN) 25 MG capsule      Meds ordered this encounter  Medications  . Alpha-Lipoic Acid 600 MG CAPS    Sig: Take 1 capsule (600 mg total) by mouth daily.    Dispense:  30 each    Refill:  11  . atorvastatin (LIPITOR) 80 MG tablet    Sig: TAKE 1 TABLET (80 MG TOTAL) BY MOUTH ONCE DAILY.    Dispense:  90 tablet    Refill:  3  . lisinopril (PRINIVIL,ZESTRIL) 5 MG tablet    Sig: Take 1 tablet (5 mg total) by mouth daily.    Dispense:  90 tablet    Refill:  3  . indomethacin (INDOCIN) 25 MG capsule    Sig: Take 1 capsule (25 mg total) by mouth 3 (three) times daily with meals. Take as needed for gout flair    Dispense:  30 capsule    Refill:  3   1. Diabetic peripheral neuropathy (HCC)  - Alpha-Lipoic Acid 600 MG CAPS; Take 1 capsule (600 mg total) by mouth daily.  Dispense: 30 each; Refill: 11  2. Acute idiopathic gout, unspecified site  - indomethacin (INDOCIN) 25 MG capsule; Take 1 capsule (25 mg total) by mouth 3 (three) times daily with meals. Take as needed for gout flair  Dispense: 30 capsule; Refill: 3  3. Essential (primary) hypertension  - lisinopril (PRINIVIL,ZESTRIL) 5 MG tablet; Take 1 tablet (5 mg total) by mouth daily.  Dispense: 90 tablet; Refill: 3  4. Controlled type 2 diabetes mellitus without complication, without long-term current use of insulin (HCC)   5. Hyperlipidemia, unspecified hyperlipidemia type  - atorvastatin (LIPITOR) 80 MG tablet; TAKE 1 TABLET (80 MG TOTAL) BY MOUTH ONCE  DAILY.  Dispense: 90 tablet; Refill: 3

## 2017-04-11 ENCOUNTER — Encounter: Payer: Self-pay | Admitting: Family Medicine

## 2017-04-11 ENCOUNTER — Ambulatory Visit (INDEPENDENT_AMBULATORY_CARE_PROVIDER_SITE_OTHER): Payer: Medicare Other | Admitting: Family Medicine

## 2017-04-11 VITALS — BP 102/42 | HR 60 | Temp 97.7°F | Resp 16 | Ht 60.0 in | Wt 116.0 lb

## 2017-04-11 DIAGNOSIS — E1140 Type 2 diabetes mellitus with diabetic neuropathy, unspecified: Secondary | ICD-10-CM

## 2017-04-11 DIAGNOSIS — R5383 Other fatigue: Secondary | ICD-10-CM

## 2017-04-11 DIAGNOSIS — I10 Essential (primary) hypertension: Secondary | ICD-10-CM | POA: Diagnosis not present

## 2017-04-11 DIAGNOSIS — E559 Vitamin D deficiency, unspecified: Secondary | ICD-10-CM

## 2017-04-11 DIAGNOSIS — E782 Mixed hyperlipidemia: Secondary | ICD-10-CM | POA: Diagnosis not present

## 2017-04-11 DIAGNOSIS — D508 Other iron deficiency anemias: Secondary | ICD-10-CM

## 2017-04-11 LAB — POCT GLYCOSYLATED HEMOGLOBIN (HGB A1C): Hemoglobin A1C: 7.2 — AB (ref ?–5.7)

## 2017-04-11 NOTE — Patient Instructions (Signed)
Thank you for coming to the clinic today.  1.  STOP Lisinopril 5mg  daily (Blood Pressure med)  2. AFTER Blood tests on Monday - then go ahead and start Iron Pill (over the counter) - 325mg  ferrous sulfate ONCE daily WITH FOOD.  You will be due for FASTING BLOOD WORK (no food or drink after midnight before, only water or coffee without cream/sugar on the morning of)  - Please go ahead and schedule a "Lab Only" visit in the morning at the [facility]  Please schedule a Follow-up Appointment to: Return in about 4 weeks (around 05/09/2017) for blood pressure, diabetes.  If you have any other questions or concerns, please feel free to call the clinic or send a message through MyChart. You may also schedule an earlier appointment if necessary.  Additionally, you may be receiving a survey about your experience at our clinic within a few days to 1 week by e-mail or mail. We value your feedback.  Saralyn PilarAlexander Karamalegos, DO [facility]

## 2017-04-11 NOTE — Progress Notes (Signed)
Subjective:    Patient ID: Gabriela Robles, female    DOB: 22-Sep-1929, 83 y.o.   MRN: [id number]  Gabriela Robles is a 83 y.o. female presenting on 04/11/2017 for Hypertension  Family (Daughter) and friend Gabriela Robles) present here for interpretation. They have signed waiver, as no exact language/dialect could be provided to obtain in person interpreter or language line.  HPI   CHRONIC HTN: Reports concern today with low BP. Now not checking BP at home, in past has had more normal to mild elevated BP. They have a BP cuff at home but daughter does not know how to "count to operate it for her mom", and she wants to bring it in to check BP next time Current Meds - Lisinopril 5mg  daily in AM   Reports good compliance, took meds today. Tolerating well, w/o complaints. Denies CP, dyspnea, HA, edema, dizziness / lightheadedness  CHRONIC DM, Type 2 - Diet Controlled. Reports no concerns CBGs: No longer checking CBG. Meds: None Currently on ACEi Lifestyle: - Diet (eats regular meals x 3 daily, tries to stay hydrated)  - Exercise (stays active in garden) Denies hypoglycemia, polyuria, visual changes, numbness or tingling.  HYPERLIPIDEMIA: - Reports no concerns. Last lipid panel 03/2015, abnormal with elevated LDL.  - Currently no longer taking Atorvastatin 80mg , has been off of this, unsure duration - On ASA 81mg , history of CVA  Anemia, iron deficiency - Last lab 01/2016 with low Hgb 8.4, has had prior trend of low Hgb. previously on ferrous sulfate, none this year  FATIGUE / TIRED Reports symptoms started for past 2-3 weeks, now report concern with "getting tired more", seems to be more fatigue symptoms, difficulty in describing symptoms. Unsure exact cause, but they think related to her BP - Denies any chest pain or shortness of breath, cough, fever/chills, poor appetite, hypersomnia or poor sleep  Social History  Substance Use Topics  . Smoking status: Never Smoker  . Smokeless  tobacco: Never Used  . Alcohol use No    Review of Systems Per HPI unless specifically indicated above     Objective:    BP (!) 102/42 (BP Location: Left Arm, Cuff Size: Normal)   Pulse 60   Temp 97.7 F (36.5 C) (Oral)   Resp 16   Ht 5' (1.524 m)   Wt 116 lb (52.6 kg)   BMI 22.65 kg/m   Wt Readings from Last 3 Encounters:  04/11/17 116 lb (52.6 kg)  10/31/16 117 lb (53.1 kg)  02/01/16 111 lb (50.3 kg)    Physical Exam  Constitutional: She is oriented to person, place, and time. She appears well-developed and well-nourished. No distress.  Currently well, thin appearing 83 year old female, comfortable, cooperative  HENT:  Head: Normocephalic and atraumatic.  Mouth/Throat: Oropharynx is clear and moist.  Eyes: Conjunctivae and EOM are normal. Pupils are equal, round, and reactive to light. Right eye exhibits no discharge. Left eye exhibits no discharge.  No significant pale conjunctiva  Neck: Normal range of motion. Neck supple. No thyromegaly present.  Cardiovascular: Normal rate, regular rhythm, normal heart sounds and intact distal pulses.   No murmur heard. Pulmonary/Chest: Effort normal and breath sounds normal. No respiratory distress. She has no rales.  Abdominal: Soft. Bowel sounds are normal. She exhibits no distension. There is no tenderness.  Musculoskeletal: Normal range of motion. She exhibits no edema or tenderness.  Lymphadenopathy:    She has no cervical adenopathy.  Neurological: She is alert and oriented to person, place,  and time.  Skin: Skin is warm and dry. No rash noted. She is not diaphoretic. No erythema.  Psychiatric: She has a normal mood and affect. Her behavior is normal.  Nursing note and vitals reviewed.  Results for orders placed or performed in visit on 04/11/17  POCT HgB A1C  Result Value Ref Range   Hemoglobin A1C 7.2 (A) 5.7    Recent Labs  04/11/17 1600  HGBA1C 7.2*        Assessment & Plan:   Problem List Items Addressed  This Visit    HLD (hyperlipidemia)    Prior elevated lipids last in 2016 Off Atorvastatin 80, unsure duration, prior history of stroke Suspect statin may have caused intolerance Remain off now with symptoms of fatigue, consider restart lower intensity statin in future      Relevant Orders   TSH   Lipid panel   Essential (primary) hypertension    Previously controlled BP, on minimal lisinopril 5mg  daily Now recently with some fatigue, measuring low BP in office today, without symptoms of hypotension Clinically reassuring, repeat manual BP check improved still low DBP HOLD lisinopril 5mg  daily for now - check labs Follow-up within 4 weeks, check BP home log while off med      Relevant Orders   COMPLETE METABOLIC PANEL WITH GFR   Controlled diabetes mellitus with diabetic neuropathy (HCC) - Primary    Well-controlled DM with A1c 7.2 (mostly stable prior 6.8 to 7) No hypoglycemia by report, on diet control Complications - peripheral neuropathy  Plan:  1. Remain off diabetic medications - risk of hypoglycemia 2. Encourage improved lifestyle - but advised more important to eat regular meals and stay well hydrated, stay active 3. Was on ACEi - hold for now with possible low BP 4. Follow-up 3-6 mo      Relevant Orders   POCT HgB A1C (Completed)   COMPLETE METABOLIC PANEL WITH GFR   Anemia    Suspected most likely etiology underlying current symptoms of fatigue, reported 2-3 weeks but suspect may be more chronic Last CBC and iron studies >81 year old with significant low Hgb and iron Had been on ferrous sulfate 325mg  daily - unsure when stopped this Check labs early next week (no lab available in office Friday afternoon) with CBC, iron panel, ferritin, TIBC - consider add retic based on results Advised to re-start OTC ferrous sulfate 325mg  daily with food starting Monday after lab draw      Relevant Orders   CBC with Differential/Platelet   Ferritin   Iron and TIBC    Reticulocytes    Other Visit Diagnoses    Vitamin D deficiency       Relevant Orders   VITAMIN D 25 Hydroxy (Vit-D Deficiency, Fractures)   Fatigue, unspecified type       Relevant Orders   CBC with Differential/Platelet   VITAMIN D 25 Hydroxy (Vit-D Deficiency, Fractures)   TSH      No orders of the defined types were placed in this encounter.     Follow up plan: Return in about 4 weeks (around 05/09/2017) for blood pressure, diabetes.  Saralyn PilarAlexander Karamalegos, DO [facility] 04/12/2017, 10:43 AM

## 2017-04-12 NOTE — Assessment & Plan Note (Signed)
Suspected most likely etiology underlying current symptoms of fatigue, reported 2-3 weeks but suspect may be more chronic Last CBC and iron studies >81 year old with significant low Hgb and iron Had been on ferrous sulfate 325mg  daily - unsure when stopped this Check labs early next week (no lab available in office Friday afternoon) with CBC, iron panel, ferritin, TIBC - consider add retic based on results Advised to re-start OTC ferrous sulfate 325mg  daily with food starting Monday after lab draw

## 2017-04-12 NOTE — Assessment & Plan Note (Signed)
Prior elevated lipids last in 2016 Off Atorvastatin 80, unsure duration, prior history of stroke Suspect statin may have caused intolerance Remain off now with symptoms of fatigue, consider restart lower intensity statin in future

## 2017-04-12 NOTE — Assessment & Plan Note (Signed)
Previously controlled BP, on minimal lisinopril $RemoveBefor [id number]$5mgow BP in office today, without symptoms of hypotension Clinically reassuring, repeat manual BP check improved still low DBP HOLD lisinopril 5mg  daily for now - check labs Follow-up within 4 weeks, check BP home log while off med

## 2017-04-12 NOTE — Assessment & Plan Note (Signed)
Well-controlled DM with A1c 7.2 (mostly stable prior 6.8 to 7) No hypoglycemia by report, on diet control Complications - peripheral neuropathy  Plan:  1. Remain off diabetic medications - risk of hypoglycemia 2. Encourage improved lifestyle - but advised more important to eat regular meals and stay well hydrated, stay active 3. Was on ACEi - hold for now with possible low BP 4. Follow-up 3-6 mo

## 2017-04-14 ENCOUNTER — Other Ambulatory Visit: Payer: Medicare Other

## 2017-04-15 ENCOUNTER — Other Ambulatory Visit: Payer: Medicare Other

## 2017-04-15 DIAGNOSIS — R5383 Other fatigue: Secondary | ICD-10-CM

## 2017-04-15 DIAGNOSIS — E782 Mixed hyperlipidemia: Secondary | ICD-10-CM

## 2017-04-15 DIAGNOSIS — E559 Vitamin D deficiency, unspecified: Secondary | ICD-10-CM | POA: Diagnosis not present

## 2017-04-15 DIAGNOSIS — E1140 Type 2 diabetes mellitus with diabetic neuropathy, unspecified: Secondary | ICD-10-CM | POA: Diagnosis not present

## 2017-04-15 DIAGNOSIS — I10 Essential (primary) hypertension: Secondary | ICD-10-CM | POA: Diagnosis not present

## 2017-04-15 DIAGNOSIS — D508 Other iron deficiency anemias: Secondary | ICD-10-CM

## 2017-04-15 LAB — TSH: TSH: 1.35 mIU/L

## 2017-04-16 LAB — COMPLETE METABOLIC PANEL WITH GFR
ALT: 14 U/L (ref 6–29)
AST: 19 U/L (ref 10–35)
Albumin: 3.9 g/dL (ref 3.6–5.1)
Alkaline Phosphatase: 73 U/L (ref 33–130)
BUN: 28 mg/dL — ABNORMAL HIGH (ref 7–25)
CO2: 21 mmol/L (ref 20–31)
Calcium: 8.8 mg/dL (ref 8.6–10.4)
Chloride: 110 mmol/L (ref 98–110)
Creat: 0.87 mg/dL (ref 0.60–0.88)
GFR, Est African American: 70 mL/min (ref >=60)
GFR, Est Non African American: 61 mL/min (ref >=60)
Glucose, Bld: 98 mg/dL (ref 65–99)
Potassium: 3.9 mmol/L (ref 3.5–5.3)
Sodium: 142 mmol/L (ref 135–146)
Total Bilirubin: 0.4 mg/dL (ref 0.2–1.2)
Total Protein: 6.5 g/dL (ref 6.1–8.1)

## 2017-04-16 LAB — CBC WITH DIFFERENTIAL/PLATELET
Basophils Absolute: 0 cells/uL (ref 0–200)
Basophils Relative: 0 %
Eosinophils Absolute: 90 cells/uL (ref 15–500)
Eosinophils Relative: 2 %
HCT: 31.7 % — ABNORMAL LOW (ref 35.0–45.0)
Hemoglobin: 9.4 g/dL — ABNORMAL LOW (ref 11.7–15.5)
Lymphocytes Relative: 34 %
Lymphs Abs: 1530 cells/uL (ref 850–3900)
MCH: 20 pg — ABNORMAL LOW (ref 27.0–33.0)
MCHC: 29.7 g/dL — ABNORMAL LOW (ref 32.0–36.0)
MCV: 67.3 fL — ABNORMAL LOW (ref 80.0–100.0)
MPV: 9.4 fL (ref 7.5–12.5)
Monocytes Absolute: 315 cells/uL (ref 200–950)
Monocytes Relative: 7 %
Neutro Abs: 2565 cells/uL (ref 1500–7800)
Neutrophils Relative %: 57 %
Platelets: 253 10*3/uL (ref 140–400)
RBC: 4.71 MIL/uL (ref 3.80–5.10)
RDW: 17.7 % — ABNORMAL HIGH (ref 11.0–15.0)
WBC: 4.5 10*3/uL (ref 3.8–10.8)

## 2017-04-16 LAB — RETICULOCYTES
ABS Retic: 103620 cells/uL — ABNORMAL HIGH (ref 20000–80000)
RBC.: 4.71 MIL/uL (ref 3.80–5.10)
Retic Ct Pct: 2.2 %

## 2017-04-16 LAB — LIPID PANEL
Cholesterol: 141 mg/dL (ref <200)
HDL: 49 mg/dL — ABNORMAL LOW (ref >50)
LDL Cholesterol: 76 mg/dL (ref <100)
Total CHOL/HDL Ratio: 2.9 Ratio (ref <5.0)
Triglycerides: 79 mg/dL (ref <150)
VLDL: 16 mg/dL (ref <30)

## 2017-04-16 LAB — VITAMIN D 25 HYDROXY (VIT D DEFICIENCY, FRACTURES): Vit D, 25-Hydroxy: 31 ng/mL (ref 30–100)

## 2017-04-16 LAB — IRON AND TIBC
%SAT: 39 % (ref 11–50)
Iron: 99 ug/dL (ref 45–160)
TIBC: 257 ug/dL (ref 250–450)
UIBC: 158 ug/dL

## 2017-04-16 LAB — FERRITIN: Ferritin: 238 ng/mL (ref 20–288)

## 2017-05-09 ENCOUNTER — Ambulatory Visit (INDEPENDENT_AMBULATORY_CARE_PROVIDER_SITE_OTHER): Payer: Medicare Other | Admitting: Family Medicine

## 2017-05-09 ENCOUNTER — Encounter: Payer: Self-pay | Admitting: Family Medicine

## 2017-05-09 VITALS — BP 134/72 | HR 62 | Temp 98.4°F | Resp 16 | Ht 60.0 in | Wt 114.4 lb

## 2017-05-09 DIAGNOSIS — E1140 Type 2 diabetes mellitus with diabetic neuropathy, unspecified: Secondary | ICD-10-CM

## 2017-05-09 DIAGNOSIS — E1142 Type 2 diabetes mellitus with diabetic polyneuropathy: Secondary | ICD-10-CM | POA: Diagnosis not present

## 2017-05-09 DIAGNOSIS — I10 Essential (primary) hypertension: Secondary | ICD-10-CM

## 2017-05-09 DIAGNOSIS — D508 Other iron deficiency anemias: Secondary | ICD-10-CM

## 2017-05-09 MED ORDER — LISINOPRIL 5 MG PO TABS: 5.0000 mg | ORAL_TABLET | Freq: Every day | ORAL | 3 refills | Status: DC

## 2017-05-09 NOTE — Assessment & Plan Note (Signed)
Stable without significant concern Mild DM neuropathy, not limiting function DM foot exam done

## 2017-05-09 NOTE — Addendum Note (Signed)
Addended by: Smitty CordsKARAMALEGOS, ALEXANDER J on: 05/09/2017 02:46 PM   Modules accepted: Orders

## 2017-05-09 NOTE — Assessment & Plan Note (Signed)
Stable, without concerns. Not due for A1c today Complicated by peripheral neuropathy DM foot done today Resumed ACEi Lisinopril Order future A1c 4 months

## 2017-05-09 NOTE — Progress Notes (Addendum)
Subjective:    Patient ID: Gabriela Robles, female    DOB: 04-24-1929, 83 y.o.   MRN: [id number]  Gabriela Robles is a 83 y.o. female presenting on 05/09/2017 for Hypertension  Family (Daughter) present here for interpretation, history provided by patient. They have signed waiver, as no exact language/dialect could be provided to obtain in person interpreter or language line.  HPI   FOLLOW-UP CHRONIC HTN: - Last visit with me 04/11/17, for same problem, at that time had low BP with concerns. treated with holding her Lisinopril at that time, see prior notes for background information. - Interval update with improvement after holding Lisinopril for 1 week, and now has resumed it for several weeks. - Today patient reports doing well. BP is normal in office. Still unable to check BP regularly at home, due to daughter not knowing how. Current Meds - Lisinopril 5m daily in AM   Reports good compliance, took meds today. Tolerating well, w/o complaints. - Continue ASA 870mfor risk reduction with prior CVA Denies CP, dyspnea, HA, edema, dizziness / lightheadedness  Anemia, iron deficiency / Fatigue: - Last visit with me 04/11/17, for same problem, with presumed factor for her fatigue after she had stopped her iron supplement months to year ago, checked labs with confirm anemia, but improved at 9.4 Hgb, microcytic MCV, she was resumed on iron supplement and increased up to twice daily, see prior notes for background information. - Interval update with tolerating iron supplement well, no new concerns. - Today patient reports doing well. Still feels tired often with frequent "too much activity" she is active in garden and outdoors, will get "sore muscles" and feel tired by end of day. But overall does not feel weak anymore, seems improved back on iron - Tolerating iron twice daily without GI intolerance - Seems to get appropriate sleep - Weight down 2 lbs in 1 month - Denies any episodes of  bleeding, sleepiness, syncope, chest pain, or dyspnea, poor appetite, constipation, abdominal pain  CHRONIC DM, Type 2 - Diet Controlled / DM Neuropathy Reports no concerns CBGs: No longer checking CBG. Meds: None Currently on ACEi Admits occasional foot pains Denies hypoglycemia, polyuria, visual changes, numbness  Social History  Substance Use Topics  . Smoking status: Never Smoker  . Smokeless tobacco: Never Used  . Alcohol use No    Review of Systems Per HPI unless specifically indicated above     Objective:    BP 134/72   Pulse 62   Temp 98.4 F (36.9 C) (Oral)   Resp 16   Ht 5' (1.524 m)   Wt 114 lb 6.4 oz (51.9 kg)   SpO2 100%   BMI 22.34 kg/m   Wt Readings from Last 3 Encounters:  05/09/17 114 lb 6.4 oz (51.9 kg)  04/11/17 116 lb (52.6 kg)  10/31/16 117 lb (53.1 kg)    Physical Exam  Constitutional: She is oriented to person, place, and time. She appears well-developed and well-nourished. No distress.  Currently well, thin appearing 89ear old female, comfortable, cooperative  HENT:  Head: Normocephalic and atraumatic.  Mouth/Throat: Oropharynx is clear and moist.  Eyes: Pupils are equal, round, and reactive to light. Conjunctivae are normal. Right eye exhibits no discharge. Left eye exhibits no discharge.  No significant pale conjunctiva  Neck: Normal range of motion.  Cardiovascular: Normal rate, regular rhythm, normal heart sounds and intact distal pulses.   No murmur heard. Pulmonary/Chest: Effort normal and breath sounds normal. No respiratory distress. She  has no wheezes. She has no rales.  Good air movement  Abdominal: Soft. Bowel sounds are normal. She exhibits no distension and no mass. There is no tenderness.  Musculoskeletal: Normal range of motion. She exhibits no edema or tenderness.  Neurological: She is alert and oriented to person, place, and time.  Skin: Skin is warm and dry. No rash noted. She is not diaphoretic. No erythema.    Psychiatric: She has a normal mood and affect. Her behavior is normal.  Nursing note and vitals reviewed.  Diabetic Foot Exam - Simple   Simple Foot Form Diabetic Foot exam was performed with the following findings:  Yes 05/09/2017  2:12 PM  Visual Inspection No deformities, no ulcerations, no other skin breakdown bilaterally:  Yes Sensation Testing See comments:  Yes Pulse Check Posterior Tibialis and Dorsalis pulse intact bilaterally:  Yes Comments Mild reduced sensation various locations to monofilament, uncertain if more language barrier or significant sensation reduced      Results for orders placed or performed in visit on 04/15/17  COMPLETE METABOLIC PANEL WITH GFR  Result Value Ref Range   Sodium 142 135 - 146 mmol/L   Potassium 3.9 3.5 - 5.3 mmol/L   Chloride 110 98 - 110 mmol/L   CO2 21 20 - 31 mmol/L   Glucose, Bld 98 65 - 99 mg/dL   BUN 28 (H) 7 - 25 mg/dL   Creat 0.87 0.60 - 0.88 mg/dL   Total Bilirubin 0.4 0.2 - 1.2 mg/dL   Alkaline Phosphatase 73 33 - 130 U/L   AST 19 10 - 35 U/L   ALT 14 6 - 29 U/L   Total Protein 6.5 6.1 - 8.1 g/dL   Albumin 3.9 3.6 - 5.1 g/dL   Calcium 8.8 8.6 - 10.4 mg/dL   GFR, Est African American 70 >=60 mL/min   GFR, Est Non African American 61 >=60 mL/min  CBC with Differential/Platelet  Result Value Ref Range   WBC 4.5 3.8 - 10.8 K/uL   RBC 4.71 3.80 - 5.10 MIL/uL   Hemoglobin 9.4 (L) 11.7 - 15.5 g/dL   HCT 31.7 (L) 35.0 - 45.0 %   MCV 67.3 (L) 80.0 - 100.0 fL   MCH 20.0 (L) 27.0 - 33.0 pg   MCHC 29.7 (L) 32.0 - 36.0 g/dL   RDW 17.7 (H) 11.0 - 15.0 %   Platelets 253 140 - 400 K/uL   MPV 9.4 7.5 - 12.5 fL   Neutro Abs 2,565 1,500 - 7,800 cells/uL   Lymphs Abs 1,530 850 - 3,900 cells/uL   Monocytes Absolute 315 200 - 950 cells/uL   Eosinophils Absolute 90 15 - 500 cells/uL   Basophils Absolute 0 0 - 200 cells/uL   Neutrophils Relative % 57 %   Lymphocytes Relative 34 %   Monocytes Relative 7 %   Eosinophils Relative 2 %    Basophils Relative 0 %   Smear Review Criteria for review not met   Ferritin  Result Value Ref Range   Ferritin 238 20 - 288 ng/mL  Iron and TIBC  Result Value Ref Range   Iron 99 45 - 160 ug/dL   UIBC 158 ug/dL   TIBC 257 250 - 450 ug/dL   %SAT 39 11 - 50 %  VITAMIN D 25 Hydroxy (Vit-D Deficiency, Fractures)  Result Value Ref Range   Vit D, 25-Hydroxy 31 30 - 100 ng/mL  TSH  Result Value Ref Range   TSH 1.35 mIU/L  Reticulocytes  Result  Value Ref Range   Retic Ct Pct 2.2 %   RBC. 4.71 3.80 - 5.10 MIL/uL   ABS Retic 103,620 (H) 20,000 - 80,000 cells/uL  Lipid panel  Result Value Ref Range   Cholesterol 141 <200 mg/dL   Triglycerides 79 <150 mg/dL   HDL 49 (L) >50 mg/dL   Total CHOL/HDL Ratio 2.9 <5.0 Ratio   VLDL 16 <30 mg/dL   LDL Cholesterol 76 <100 mg/dL    Recent Labs  04/11/17 1600  HGBA1C 7.2*        Assessment & Plan:   Problem List Items Addressed This Visit    Essential (primary) hypertension - Primary    Well-controlled HTN, improved BP now, with normal range, much improved DBP from previous visit 1 month ago. - Home BP readings, none available, does not seem family able to check this  No known complications / History of CVA on ASA   Plan:  1. Continue current BP regimen - resumed Lisinopril 62m daily few weeks ago, refilled today to continue for now 2. Encourage improved lifestyle - improve hydration, low sodium diet, cautious with too much regular exercise 3. Follow-up 4 months      Relevant Medications   lisinopril (PRINIVIL,ZESTRIL) 5 MG tablet   Diabetic peripheral neuropathy (HCC)    Stable without significant concern Mild DM neuropathy, not limiting function DM foot exam done      Relevant Medications   lisinopril (PRINIVIL,ZESTRIL) 5 MG tablet   Controlled diabetes mellitus with diabetic neuropathy (HCC)    Stable, without concerns. Not due for [id number] Complicated by peripheral neuropathy DM foot done today Resumed ACEi  Lisinopril Order future A1c 4 months      Relevant Medications   lisinopril (PRINIVIL,ZESTRIL) 5 MG tablet   Other Relevant Orders   Hemoglobin A1c   Anemia    Stable chronic anemia, likely cause of her fatigue with iron deficiency with low MCV, improved clinically back on iron supplement No clear etiology of anemia. Likely multifactorial, some dietary / nutritional as well. Reviewed last CBC 03/2017 with low but improved Hgb 9.4  Plan: 1. Continue ferrous sulfate OTC iron BID as tolerated - did not decide to inc to TID for now to avoid GI intolerance 2. Improve nutrition, may add protein supplement ensure/boost 3. Follow-up 4 months - re-check CBC, Hgb trend      Relevant Orders   CBC with Differential/Platelet      Meds ordered this encounter  Medications  . lisinopril (PRINIVIL,ZESTRIL) 5 MG tablet    Sig: Take 1 tablet (5 mg total) by mouth daily.    Dispense:  90 tablet    Refill:  3      Follow up plan: Return in about 4 months (around 09/09/2017) for HTN, Anemia, diabetes.  Future labs for anemia and A1c ordered.  [facility]obie [facility] 05/09/2017, 2:45 PM

## 2017-05-09 NOTE — Patient Instructions (Addendum)
Thank you for coming to the clinic today.  1. Try to limit excessive working outside and in garden  Working too hard can cause her to be tired and sore  Recommend to start taking (Acetaminophen) Tylenol Extra Strength 500mg  tabs - take 1 to 2 tabs per dose (max 1000mg ) every 6 to 8 hours for THREE TIMES A DAY max 24 hour daily dose is 6 tablets or 3000mg .  Continue with iron supplement TWICE daily  Continue with Blood Pressure pill once daily in morning.  May try nutrition supplement - Ensure or Boost protein supplement once daily to help maintain weight.  You will be due for Non fasting BLOOD WORK  - Please go ahead and schedule a "Lab Only" visit in the morning at the clinic for lab draw in 4 months  - Make sure Lab Only appointment is at least 1-2 weeks before your next appointment, so that results will be available   Please schedule a Follow-up Appointment to: Return in about 4 months (around 09/09/2017) for HTN, Anemia.  If you have any other questions or concerns, please feel free to call the clinic or send a message through MyChart. You may also schedule an earlier appointment if necessary.  Additionally, you may be receiving a survey about your experience at our clinic within a few days to 1 week by e-mail or mail. We value your feedback.  Saralyn PilarAlexander Jaben Benegas, DO [facility] [place]

## 2017-05-09 NOTE — Assessment & Plan Note (Signed)
Well-controlled HTN, improved BP now, with normal range, much improved DBP from previous visit 1 month ago. - Home BP readings, none available, does not seem family able to check this  No known complications / History of CVA on ASA   Plan:  1. Continue current BP regimen - resumed Lisinopril 5mg  daily few weeks ago, refilled today to continue for now 2. Encourage improved lifestyle - improve hydration, low sodium diet, cautious with too much regular exercise 3. Follow-up 4 months

## 2017-05-09 NOTE — Assessment & Plan Note (Signed)
Stable chronic anemia, likely cause of her fatigue with iron deficiency with low MCV, improved clinically back on iron supplement No clear etiology of anemia. Likely multifactorial, some dietary / nutritional as well. Reviewed last CBC 03/2017 with low but improved Hgb 9.4  Plan: 1. Continue ferrous sulfate OTC iron BID as tolerated - did not decide to inc to TID for now to avoid GI intolerance 2. Improve nutrition, may add protein supplement ensure/boost 3. Follow-up 4 months - re-check CBC, Hgb trend

## 2017-08-25 DIAGNOSIS — Z23 Encounter for immunization: Secondary | ICD-10-CM | POA: Diagnosis not present

## 2017-09-10 ENCOUNTER — Other Ambulatory Visit: Payer: Medicare Other

## 2017-09-10 DIAGNOSIS — E1140 Type 2 diabetes mellitus with diabetic neuropathy, unspecified: Secondary | ICD-10-CM

## 2017-09-10 DIAGNOSIS — D508 Other iron deficiency anemias: Secondary | ICD-10-CM

## 2017-09-11 LAB — CBC WITH DIFFERENTIAL/PLATELET
Basophils Absolute: 21 cells/uL (ref 0–200)
Basophils Relative: 0.5 %
Eosinophils Absolute: 201 cells/uL (ref 15–500)
Eosinophils Relative: 4.9 %
HCT: 30.5 % — ABNORMAL LOW (ref 35.0–45.0)
Hemoglobin: 9.3 g/dL — ABNORMAL LOW (ref 11.7–15.5)
Lymphs Abs: 1702 cells/uL (ref 850–3900)
MCH: 20.7 pg — ABNORMAL LOW (ref 27.0–33.0)
MCHC: 30.5 g/dL — ABNORMAL LOW (ref 32.0–36.0)
MCV: 67.8 fL — ABNORMAL LOW (ref 80.0–100.0)
MPV: 11.5 fL (ref 7.5–12.5)
Monocytes Relative: 8 %
Neutro Abs: 1849 cells/uL (ref 1500–7800)
Neutrophils Relative %: 45.1 %
Platelets: 225 10*3/uL (ref 140–400)
RBC: 4.5 10*6/uL (ref 3.80–5.10)
RDW: 16.3 % — ABNORMAL HIGH (ref 11.0–15.0)
Total Lymphocyte: 41.5 %
WBC mixed population: 328 cells/uL (ref 200–950)
WBC: 4.1 10*3/uL (ref 3.8–10.8)

## 2017-09-11 LAB — HEMOGLOBIN A1C
Hgb A1c MFr Bld: 6.5 % of total Hgb — ABNORMAL HIGH (ref <5.7)
Mean Plasma Glucose: 140 (calc)
eAG (mmol/L): 7.7 (calc)

## 2017-09-16 ENCOUNTER — Ambulatory Visit: Payer: Medicare Other

## 2017-09-16 ENCOUNTER — Ambulatory Visit: Payer: Medicare Other | Admitting: Family Medicine

## 2017-09-30 ENCOUNTER — Ambulatory Visit (INDEPENDENT_AMBULATORY_CARE_PROVIDER_SITE_OTHER): Payer: Medicare Other | Admitting: Family Medicine

## 2017-09-30 ENCOUNTER — Other Ambulatory Visit: Payer: Self-pay | Admitting: Family Medicine

## 2017-09-30 ENCOUNTER — Encounter: Payer: Self-pay | Admitting: Family Medicine

## 2017-09-30 ENCOUNTER — Ambulatory Visit (INDEPENDENT_AMBULATORY_CARE_PROVIDER_SITE_OTHER): Payer: Medicare Other

## 2017-09-30 VITALS — BP 158/82 | HR 66 | Temp 98.4°F | Resp 16 | Ht 60.0 in | Wt 116.8 lb

## 2017-09-30 VITALS — BP 130/60 | HR 66 | Temp 98.4°F | Resp 16 | Ht 60.0 in | Wt 116.8 lb

## 2017-09-30 DIAGNOSIS — I10 Essential (primary) hypertension: Secondary | ICD-10-CM | POA: Diagnosis not present

## 2017-09-30 DIAGNOSIS — E1140 Type 2 diabetes mellitus with diabetic neuropathy, unspecified: Secondary | ICD-10-CM | POA: Diagnosis not present

## 2017-09-30 DIAGNOSIS — Z0000 Encounter for general adult medical examination without abnormal findings: Secondary | ICD-10-CM

## 2017-09-30 DIAGNOSIS — D508 Other iron deficiency anemias: Secondary | ICD-10-CM

## 2017-09-30 DIAGNOSIS — M1A09X0 Idiopathic chronic gout, multiple sites, without tophus (tophi): Secondary | ICD-10-CM

## 2017-09-30 DIAGNOSIS — E1142 Type 2 diabetes mellitus with diabetic polyneuropathy: Secondary | ICD-10-CM

## 2017-09-30 DIAGNOSIS — E782 Mixed hyperlipidemia: Secondary | ICD-10-CM

## 2017-09-30 MED ORDER — FERROUS SULFATE 325 (65 FE) MG PO TABS: 325.0000 mg | ORAL_TABLET | Freq: Two times a day (BID) | ORAL | 12 refills | Status: DC

## 2017-09-30 NOTE — Progress Notes (Signed)
Subjective:   Gabriela Robles is a 83 y.o. female who presents for Medicare Annual (Subsequent) preventive examination.   [name] interpreted today's visit, waiver was signed today by both parties.   Review of Systems:   Cardiac Risk Factors include: hypertension;diabetes mellitus;advanced age (>3755men, 24>65 women);dyslipidemia     Objective:     Vitals: BP (!) 158/82 (BP Location: Left Arm, Patient Position: Sitting)   Pulse 66   Temp 98.4 F (36.9 C) (Oral)   Resp 16   Ht 5' (1.524 m)   Wt 116 lb 12.8 oz (53 kg)   BMI 22.81 kg/m   Body mass index is 22.81 kg/m.  Advanced Directives 09/30/2017  Does Patient Have a Medical Advance Directive? No  Would patient like information on creating a medical advance directive? Yes (MAU/Ambulatory/Procedural Areas - Information given)    Tobacco Social History   Tobacco Use  Smoking Status Never Smoker  Smokeless Tobacco Never Used     Counseling given: Not Answered   Clinical Intake:  Pre-visit preparation completed: Yes  Pain : No/denies pain     Nutritional Status: BMI of 19-24  Normal Diabetes: Yes CBG done?: No Did pt. bring in CBG monitor from home?: No  Activities of Daily Living: Independent Ambulation: Independent with device- listed below Home Assistive Devices/Equipment: Dentures (specify type), Cane Medication Administration: Independent Home Management: Independent  Barriers to Care Management & Learning: Literacy level  Unable to ask?: Yes  How often do you need to have someone help you when you read instructions, pamphlets, or other written materials from your doctor or pharmacy?: 5 - Always What is the last grade level you completed in school?: didnt go to school   Interpreter Needed?: No(patients grandson interprets - paperwork signed)  Information entered by :: Zayana Salvador,LPN   Past Medical History:  Diagnosis Date  . Arthritis   . Diabetes mellitus without complication (HCC)     History reviewed. No pertinent surgical history. Family History  Family history unknown: Yes   Social History   Socioeconomic History  . Marital status: Widowed    Spouse name: None  . Number of children: None  . Years of education: None  . Highest education level: None  Social Needs  . Financial resource strain: Not hard at all  . Food insecurity - worry: Never true  . Food insecurity - inability: Never true  . Transportation needs - medical: No  . Transportation needs - non-medical: No  Occupational History  . None  Tobacco Use  . Smoking status: Never Smoker  . Smokeless tobacco: Never Used  Substance and Sexual Activity  . Alcohol use: No    Alcohol/week: 0.0 oz  . Drug use: No  . Sexual activity: None  Other Topics Concern  . None  Social History Narrative  . None    Outpatient Encounter Medications as of 09/30/2017  Medication Sig  . Alpha-Lipoic Acid 600 MG CAPS Take 1 capsule (600 mg total) by mouth daily.  . [facility] ASPIRIN ADULT LOW DOSE 81 MG chewable tablet Chew 81 mg by mouth daily.  . ferrous sulfate 325 (65 FE) MG tablet Take 1 tablet (325 mg total) by mouth daily with breakfast.  . indomethacin (INDOCIN) 25 MG capsule Take 1 capsule (25 mg total) by mouth 3 (three) times daily with meals. Take as needed for gout flair  . lisinopril (PRINIVIL,ZESTRIL) 5 MG tablet Take 1 tablet (5 mg total) by mouth daily.  . MULTIPLE VITAMINS-MINERALS ER PO Take  by mouth.  . Omega-3 Fatty Acids (FISH OIL) 1000 MG CPDR Take by mouth.   No facility-administered encounter medications on file as of 09/30/2017.     Activities of Daily Living In your present state of health, do you have any difficulty performing the following activities: 09/30/2017 10/31/2016  Hearing? Y N  Comment occasioanlly  -  Vision? N N  Difficulty concentrating or making decisions? N N  Walking or climbing stairs? N N  Dressing or bathing? N N  Doing errands, shopping? N N  Preparing Food and  eating ? N -  Using the Toilet? N -  In the past six months, have you accidently leaked urine? N -  Do you have problems with loss of bowel control? N -  Managing your Medications? N -  Managing your Finances? N -  Housekeeping or managing your Housekeeping? N -  Some recent data might be hidden    Timed Get Up and Go performed: completed in 20 seconds without use of assistive device, she does use cane normally.   Patient Care Team: Smitty CordsKaramalegos, Alexander J, DO as PCP - General (Family Medicine) Janeann ForehandHawkins, James H Jr., MD (Family Medicine)    Assessment:     Exercise Activities and Dietary recommendations Current Exercise Habits: Home exercise routine, Type of exercise: walking, Time (Minutes): 20, Frequency (Times/Week): 3, Weekly Exercise (Minutes/Week): 60, Intensity: Mild, Exercise limited by: None identified  Goals    . DIET - INCREASE WATER INTAKE     Recommend drinking at least 6-8 glasses of water a day       Fall Risk Fall Risk  09/30/2017 10/31/2016 01/30/2016 07/24/2015 04/17/2015  Falls in the past year? No No No No No   Is the patient's home free of loose throw rugs in walkways, pet beds, electrical cords, etc?   yes      Grab bars in the bathroom? no      Handrails on the stairs?   yes      Adequate lighting?   yes  Depression Screen PHQ 2/9 Scores 09/30/2017 10/31/2016 01/30/2016 07/24/2015  PHQ - 2 Score 0 0 0 0     Cognitive Function  unable to perform today      Immunization History  Administered Date(s) Administered  . Influenza, High Dose Seasonal PF 07/24/2015  . Influenza-Unspecified 08/22/2017   Screening Tests Health Maintenance  Topic Date Due  . DEXA SCAN  10/31/2017 (Originally 07/07/1996)  . OPHTHALMOLOGY EXAM  08/28/2018 (Originally 07/07/1939)  . TETANUS/TDAP  09/30/2018 (Originally 07/07/1950)  . PNA vac Low Risk Adult (1 of 2 - PCV13) 09/30/2018 (Originally 07/07/1996)  . HEMOGLOBIN A1C  03/10/2018  . FOOT EXAM  05/09/2018  . INFLUENZA VACCINE   Completed   Cancer Screenings: Lung: Low Dose CT Chest recommended if Age 86-80 years, 30 pack-year currently smoking OR have quit w/in 15years. Patient does not qualify. Breast: Up to date on Mammogram? No (Declined, no longer required) Up to date of Bone Density/Dexa? No (declined) Colorectal: no longer required, declined  Additional Screenings:  Hepatitis B/HIV/Syphillis: not indicated Hepatitis C Screening: not indicated     Plan:    I have personally reviewed and addressed the Medicare Annual Wellness questionnaire and have noted the following in the patient's chart:  A. Medical and social history B. Use of alcohol, tobacco or illicit drugs  C. Current medications and supplements D. Functional ability and status E.  Nutritional status F.  Physical activity G. Advance directives H. List of  other physicians I.  Hospitalizations, surgeries, and ER visits in previous 12 months J.  Vitals K. Screenings such as hearing and vision if needed, cognitive and depression L. Referrals and appointments   In addition, I have reviewed and discussed with patient certain preventive protocols, quality metrics, and best practice recommendations. A written personalized care plan for preventive services as well as general preventive health recommendations were provided to patient.   Signed,  Marin Roberts, LPN Nurse Health Advisor   Nurse Notes: none

## 2017-09-30 NOTE — Patient Instructions (Addendum)
Thank you for coming to the clinic today.  1. Keep up the great work overall!  Blood sugar is well controlled A1c 6.5, normal, continue current diet and exercise  2. Anemia or iron deficiency is the same or unchanged, I think the iron pills are helping keep stable, but this problem is more complex and does not seem to easily resolve with just iron treatment - If not improved in future we can consider referral to a blood specialist for 2nd opinion  3. Check medications once more - see if you have Indocin (indomethacin) this is for GOUT FLARE ONLY - otherwise we do not need to take this one.  DUE for FASTING BLOOD WORK (no food or drink after midnight before the lab appointment, only water or coffee without cream/sugar on the morning of)  SCHEDULE "Lab Only" visit in the morning at the clinic for lab draw in 6 MONTHS   - Make sure Lab Only appointment is at about 1 week before your next appointment, so that results will be available  For Lab Results, once available within 2-3 days of blood draw, you can can log in to MyChart online to view your results and a brief explanation. Also, we can discuss results at next follow-up visit.  Please schedule a Follow-up Appointment to: Return in about 6 months (around 03/31/2018) for Annual Physical.  If you have any other questions or concerns, please feel free to call the clinic or send a message through MyChart. You may also schedule an earlier appointment if necessary.  Additionally, you may be receiving a survey about your experience at our clinic within a few days to 1 week by e-mail or mail. We value your feedback.  Saralyn PilarAlexander Malaney Mcbean, DO [facility]

## 2017-09-30 NOTE — Progress Notes (Signed)
Subjective:    Patient ID: Gabriela DownerHaeo Robles, female    DOB: 23-Feb-1929, 83 y.o.   MRN: [id number]  Gabriela DownerHaeo Rodkey is a 83 y.o. female presenting on 09/30/2017 for Hypertension and Anemia  Family friend Jonny Ruiz(John) present here for interpretation. They have signed waiver, as no exact language/dialect could be provided to obtain in person interpreter or language line.  HPI   FOLLOW-UP CHRONIC HTN: - Today patient reports doing well. No BP checks outside office. Current Meds - Lisinopril 5mg  daily in AM   Reports good compliance, took meds today. Tolerating well, w/o complaints. - Continue ASA 81mg  for risk reduction with prior CVA Denies CP, dyspnea, HA, edema, dizziness / lightheadedness  Anemia, iron deficiency / Fatigue: - Last visit with me 6/15 and 7/13, for same problem, has had prior stable low Hgb, microcytic MCV, and interval iron anemia studies showed normal iron stores and ferritin, she was resumed on iron supplement and increased up to twice daily, see prior notes for background information. - Interval update with tolerating iron supplement well, no new concerns. - Today patient reports doing well. Seems clinically improved, without any significant fatigue, sleepiness or tired, remains active daily by report - Tolerating iron twice daily without GI intolerance - Regular exercises daily, active walks around house and outdoors in yard, very functional - Denies any episodes of bleeding, sleepiness, syncope, chest pain, or dyspnea, poor appetite, constipation, abdominal pain  CHRONIC DM, Type 2 - Diet Controlled / DM Neuropathy Reports no concerns. Pleased with normal A1c, reviewed prior readings CBGs: No longer checking CBG. Meds: None Currently on ACEi Diet is improved and stable, does eat some sweets Denies hypoglycemia, polyuria, visual changes, numbness  Health Maintenance: UTD Flu vaccine Declined TDAP and PNA vaccine in past  [facility] 09/30/2017  10/31/2016 01/30/2016  Decreased Interest 0 0 0  Down, Depressed, Hopeless 0 0 0  PHQ - 2 Score 0 0 0    Social History   Tobacco Use  . Smoking status: Never Smoker  . Smokeless tobacco: Never Used  Substance Use Topics  . Alcohol use: No    Alcohol/week: 0.0 oz  . Drug use: No    Review of Systems Per HPI unless specifically indicated above     Objective:    BP 130/60 (BP Location: Left Arm, Cuff Size: Normal)   Pulse 66   Temp 98.4 F (36.9 C) (Oral)   Resp 16   Ht 5' (1.524 m)   Wt 116 lb 12.8 oz (53 kg)   BMI 22.81 kg/m   Wt Readings from Last 3 Encounters:  09/30/17 116 lb 12.8 oz (53 kg)  09/30/17 116 lb 12.8 oz (53 kg)  05/09/17 114 lb 6.4 oz (51.9 kg)    Physical Exam  Constitutional: She is oriented to person, place, and time. She appears well-developed and well-nourished. No distress.  Currently well, thin appearing 83 year old female, comfortable, cooperative, has cane  HENT:  Head: Normocephalic and atraumatic.  Mouth/Throat: Oropharynx is clear and moist.  Eyes: Conjunctivae are normal. Pupils are equal, round, and reactive to light. Right eye exhibits no discharge. Left eye exhibits no discharge.  No significant pale conjunctiva  Neck: Normal range of motion.  Cardiovascular: Normal rate, regular rhythm, normal heart sounds and intact distal pulses.  No murmur heard. Pulmonary/Chest: Effort normal and breath sounds normal. No respiratory distress. She has no wheezes. She has no rales.  Good air movement  Abdominal: Soft. Bowel sounds are normal.  She exhibits no distension and no mass. There is no tenderness.  Musculoskeletal: Normal range of motion. She exhibits no edema or tenderness.  Neurological: She is alert and oriented to person, place, and time.  Skin: Skin is warm and dry. No rash noted. She is not diaphoretic. No erythema.  Psychiatric: She has a normal mood and affect. Her behavior is normal.  Does not speak English and overall limited  verbal responses, despite interpreter  Nursing note and vitals reviewed.  Results for orders placed or performed in visit on 09/10/17  CBC with Differential/Platelet  Result Value Ref Range   WBC 4.1 3.8 - 10.8 Thousand/uL   RBC 4.50 3.80 - 5.10 Million/uL   Hemoglobin 9.3 (L) 11.7 - 15.5 g/dL   HCT 16.1 (L) 09.6 - 04.5 %   MCV 67.8 (L) 80.0 - 100.0 fL   MCH 20.7 (L) 27.0 - 33.0 pg   MCHC 30.5 (L) 32.0 - 36.0 g/dL   RDW 40.9 (H) 81.1 - 91.4 %   Platelets 225 140 - 400 Thousand/uL   MPV 11.5 7.5 - 12.5 fL   Neutro Abs 1,849 1,500 - 7,800 cells/uL   Lymphs Abs 1,702 850 - 3,900 cells/uL   WBC mixed population 328 200 - 950 cells/uL   Eosinophils Absolute 201 15 - 500 cells/uL   Basophils Absolute 21 0 - 200 cells/uL   Neutrophils Relative % 45.1 %   Total Lymphocyte 41.5 %   Monocytes Relative 8.0 %   Eosinophils Relative 4.9 %   Basophils Relative 0.5 %   Smear Review    Hemoglobin A1c  Result Value Ref Range   Hgb A1c MFr Bld 6.5 (H) <5.7 % of total Hgb   Mean Plasma Glucose 140 (calc)   eAG (mmol/L) 7.7 (calc)   Recent Labs    04/11/17 1600 09/10/17 0816  HGBA1C 7.2* 6.5*       Assessment & Plan:   Problem List Items Addressed This Visit    Anemia    Stable chronic anemia, with iron deficiency with low MCV but suspect some mixed component with normal iron stores, ferritin possibly some level of chronic disease No clear etiology of anemia. Likely multifactorial, some dietary / nutritional as well.  Plan: 1. Continue ferrous sulfate OTC iron BID as tolerated 2. Improve nutrition, may add protein supplement ensure/boost 3. Follow-up 6 months repeat labs - future consider refer Heme if unresolved      Relevant Medications   ferrous sulfate 325 (65 FE) MG tablet   Controlled diabetes mellitus with diabetic neuropathy (HCC)    Well-controlled DM with A1c 6.5 improved from 7.2 Complications - peripheral neuropathy  Plan:  1. Not on any medication 2. Encourage  improved lifestyle - low carb, low sugar diet, reduce portion size, continue improving regular exercise 3. Continue ASA, ACEi 5. Follow-up q 6 mo A1c      Essential (primary) hypertension - Primary    Well-controlled HTN, improved BP now, with normal range, no further low BP - Home BP readings, none available still No known complications / History of CVA on ASA   Plan:  1. Continue current BP regimen - Lisinopril 5mg  daily 2. Encourage improved lifestyle - improve hydration, low sodium diet exercise 3. Follow-up 6 months         Meds ordered this encounter  Medications  . ferrous sulfate 325 (65 FE) MG tablet    Sig: Take 1 tablet (325 mg total) by mouth 2 (two) times daily with  a meal.    Dispense:  30 tablet    Refill:  12    OTC    Follow up plan: Return in about 6 months (around 03/31/2018) for Annual Physical.  Future lab orders placed for 03/2018  Saralyn PilarAlexander Jyl [place], DO [facility] 10/01/2017, 12:48 AM

## 2017-09-30 NOTE — Patient Instructions (Signed)
Gabriela Robles , Thank you for taking time to come for your Medicare Wellness Visit. I appreciate your ongoing commitment to your health goals. Please review the following plan we discussed and let me know if I can assist you in the future.   Screening recommendations/referrals: Colonoscopy: no longer required Mammogram: no longer required Bone Density: declined Recommended yearly ophthalmology/optometry visit for glaucoma screening and checkup Recommended yearly dental visit for hygiene and checkup  Vaccinations: Influenza vaccine: up to date  Pneumococcal vaccine: declined at today's visit Tdap vaccine: due, check with your insurance company for coverage Shingles vaccine: due, check with your insurance company for coverage  Advanced directives: Advance directive discussed with you today. I have provided a copy for you to complete at home and have notarized. Once this is complete please bring a copy in to our office so we can scan it into your chart.  Conditions/risks identified: Recommend drinking at least 6-8 glasses of water a day   Next appointment: Follow up in one year for your annual wellness exam.   Preventive Care 65 Years and Older, Female Preventive care refers to lifestyle choices and visits with your health care provider that can promote health and wellness. What does preventive care include?  A yearly physical exam. This is also called an annual well check.  Dental exams once or twice a year.  Routine eye exams. Ask your health care provider how often you should have your eyes checked.  Personal lifestyle choices, including:  Daily care of your teeth and gums.  Regular physical activity.  Eating a healthy diet.  Avoiding tobacco and drug use.  Limiting alcohol use.  Practicing safe sex.  Taking low-dose aspirin every day.  Taking vitamin and mineral supplements as recommended by your health care provider. What happens during an annual well  check? The services and screenings done by your health care provider during your annual well check will depend on your age, overall health, lifestyle risk factors, and family history of disease. Counseling  Your health care provider may ask you questions about your:  Alcohol use.  Tobacco use.  Drug use.  Emotional well-being.  Home and relationship well-being.  Sexual activity.  Eating habits.  History of falls.  Memory and ability to understand (cognition).  Work and work Astronomerenvironment.  Reproductive health. Screening  You may have the following tests or measurements:  Height, weight, and BMI.  Blood pressure.  Lipid and cholesterol levels. These may be checked every 5 years, or more frequently if you are over 81 years old.  Skin check.  Lung cancer screening. You may have this screening every year starting at age 81 if you have a 30-pack-year history of smoking and currently smoke or have quit within the past 15 years.  Fecal occult blood test (FOBT) of the stool. You may have this test every year starting at age 81.  Flexible sigmoidoscopy or colonoscopy. You may have a sigmoidoscopy every 5 years or a colonoscopy every 10 years starting at age 750.  Hepatitis C blood test.  Hepatitis B blood test.  Sexually transmitted disease (STD) testing.  Diabetes screening. This is done by checking your blood sugar (glucose) after you have not eaten for a while (fasting). You may have this done every 1-3 years.  Bone density scan. This is done to screen for osteoporosis. You may have this done starting at age 81.  Mammogram. This may be done every 1-2 years. Talk to your health care provider about how often  you should have regular mammograms. Talk with your health care provider about your test results, treatment options, and if necessary, the need for more tests. Vaccines  Your health care provider may recommend certain vaccines, such as:  Influenza vaccine. This is  recommended every year.  Tetanus, diphtheria, and acellular pertussis (Tdap, Td) vaccine. You may need a Td booster every 10 years.  Zoster vaccine. You may need this after age 61.  Pneumococcal 13-valent conjugate (PCV13) vaccine. One dose is recommended after age 62.  Pneumococcal polysaccharide (PPSV23) vaccine. One dose is recommended after age 23. Talk to your health care provider about which screenings and vaccines you need and how often you need them. This information is not intended to replace advice given to you by your health care provider. Make sure you discuss any questions you have with your health care provider. Document Released: 11/10/2015 Document Revised: 07/03/2016 Document Reviewed: 08/15/2015 Elsevier Interactive Patient Education  2017 [place] Prevention in the Home Falls can cause injuries. They can happen to people of all ages. There are many things you can do to make your home safe and to help prevent falls. What can I do on the outside of my home?  Regularly fix the edges of walkways and driveways and fix any cracks.  Remove anything that might make you trip as you walk through a door, such as a raised step or threshold.  Trim any bushes or trees on the path to your home.  Use bright outdoor lighting.  Clear any walking paths of anything that might make someone trip, such as rocks or tools.  Regularly check to see if handrails are loose or broken. Make sure that both sides of any steps have handrails.  Any raised decks and porches should have guardrails on the edges.  Have any leaves, snow, or ice cleared regularly.  Use sand or salt on walking paths during winter.  Clean up any spills in your garage right away. This includes oil or grease spills. What can I do in the bathroom?  Use night lights.  Install grab bars by the toilet and in the tub and shower. Do not use towel bars as grab bars.  Use non-skid mats or decals in the tub or  shower.  If you need to sit down in the shower, use a plastic, non-slip stool.  Keep the floor dry. Clean up any water that spills on the floor as soon as it happens.  Remove soap buildup in the tub or shower regularly.  Attach bath mats securely with double-sided non-slip rug tape.  Do not have throw rugs and other things on the floor that can make you trip. What can I do in the bedroom?  Use night lights.  Make sure that you have a light by your bed that is easy to reach.  Do not use any sheets or blankets that are too big for your bed. They should not hang down onto the floor.  Have a firm chair that has side arms. You can use this for support while you get dressed.  Do not have throw rugs and other things on the floor that can make you trip. What can I do in the kitchen?  Clean up any spills right away.  Avoid walking on wet floors.  Keep items that you use a lot in easy-to-reach places.  If you need to reach something above you, use a strong step stool that has a grab bar.  Keep electrical cords  out of the way.  Do not use floor polish or wax that makes floors slippery. If you must use wax, use non-skid floor wax.  Do not have throw rugs and other things on the floor that can make you trip. What can I do with my stairs?  Do not leave any items on the stairs.  Make sure that there are handrails on both sides of the stairs and use them. Fix handrails that are broken or loose. Make sure that handrails are as long as the stairways.  Check any carpeting to make sure that it is firmly attached to the stairs. Fix any carpet that is loose or worn.  Avoid having throw rugs at the top or bottom of the stairs. If you do have throw rugs, attach them to the floor with carpet tape.  Make sure that you have a light switch at the top of the stairs and the bottom of the stairs. If you do not have them, ask someone to add them for you. What else can I do to help prevent  falls?  Wear shoes that:  Do not have high heels.  Have rubber bottoms.  Are comfortable and fit you well.  Are closed at the toe. Do not wear sandals.  If you use a stepladder:  Make sure that it is fully opened. Do not climb a closed stepladder.  Make sure that both sides of the stepladder are locked into place.  Ask someone to hold it for you, if possible.  Clearly mark and make sure that you can see:  Any grab bars or handrails.  First and last steps.  Where the edge of each step is.  Use tools that help you move around (mobility aids) if they are needed. These include:  Canes.  Walkers.  Scooters.  Crutches.  Turn on the lights when you go into a dark area. Replace any light bulbs as soon as they burn out.  Set up your furniture so you have a clear path. Avoid moving your furniture around.  If any of your floors are uneven, fix them.  If there are any pets around you, be aware of where they are.  Review your medicines with your doctor. Some medicines can make you feel dizzy. This can increase your chance of falling. Ask your doctor what other things that you can do to help prevent falls. This information is not intended to replace advice given to you by your health care provider. Make sure you discuss any questions you have with your health care provider. Document Released: 08/10/2009 Document Revised: 03/21/2016 Document Reviewed: 11/18/2014 Elsevier Interactive Patient Education  2017 [employer].

## 2017-10-01 NOTE — Assessment & Plan Note (Signed)
Stable chronic anemia, with iron deficiency with low MCV but suspect some mixed component with normal iron stores, ferritin possibly some level of chronic disease No clear etiology of anemia. Likely multifactorial, some dietary / nutritional as well.  Plan: 1. Continue ferrous sulfate OTC iron BID as tolerated 2. Improve nutrition, may add protein supplement ensure/boost 3. Follow-up 6 months repeat labs - future consider refer Heme if unresolved

## 2017-10-01 NOTE — Assessment & Plan Note (Signed)
Well-controlled DM with A1c 6.5 improved from 7.2 Complications - peripheral neuropathy  Plan:  1. Not on any medication 2. Encourage improved lifestyle - low carb, low sugar diet, reduce portion size, continue improving regular exercise 3. Continue ASA, ACEi 5. Follow-up q 6 mo A1c

## 2017-10-01 NOTE — Assessment & Plan Note (Signed)
Well-controlled HTN, improved BP now, with normal range, no further low BP - Home BP readings, none available still No known complications / History of CVA on ASA   Plan:  1. Continue current BP regimen - Lisinopril 5mg  daily 2. Encourage improved lifestyle - improve hydration, low sodium diet exercise 3. Follow-up 6 months

## 2018-04-02 ENCOUNTER — Other Ambulatory Visit: Payer: Medicare Other

## 2018-04-02 DIAGNOSIS — I10 Essential (primary) hypertension: Secondary | ICD-10-CM

## 2018-04-02 DIAGNOSIS — E1140 Type 2 diabetes mellitus with diabetic neuropathy, unspecified: Secondary | ICD-10-CM

## 2018-04-02 DIAGNOSIS — E782 Mixed hyperlipidemia: Secondary | ICD-10-CM

## 2018-04-02 DIAGNOSIS — M1A09X0 Idiopathic chronic gout, multiple sites, without tophus (tophi): Secondary | ICD-10-CM | POA: Diagnosis not present

## 2018-04-02 DIAGNOSIS — D508 Other iron deficiency anemias: Secondary | ICD-10-CM | POA: Diagnosis not present

## 2018-04-03 ENCOUNTER — Encounter: Payer: Self-pay | Admitting: Family Medicine

## 2018-04-03 ENCOUNTER — Ambulatory Visit (INDEPENDENT_AMBULATORY_CARE_PROVIDER_SITE_OTHER): Payer: Medicare Other | Admitting: Family Medicine

## 2018-04-03 VITALS — BP 155/60 | HR 57 | Temp 98.1°F | Resp 16 | Ht 60.0 in | Wt 113.8 lb

## 2018-04-03 DIAGNOSIS — D508 Other iron deficiency anemias: Secondary | ICD-10-CM | POA: Diagnosis not present

## 2018-04-03 DIAGNOSIS — I10 Essential (primary) hypertension: Secondary | ICD-10-CM | POA: Diagnosis not present

## 2018-04-03 DIAGNOSIS — M1A09X0 Idiopathic chronic gout, multiple sites, without tophus (tophi): Secondary | ICD-10-CM

## 2018-04-03 DIAGNOSIS — E782 Mixed hyperlipidemia: Secondary | ICD-10-CM

## 2018-04-03 DIAGNOSIS — T466X5A Adverse effect of antihyperlipidemic and antiarteriosclerotic drugs, initial encounter: Secondary | ICD-10-CM | POA: Insufficient documentation

## 2018-04-03 DIAGNOSIS — I6930 Unspecified sequelae of cerebral infarction: Secondary | ICD-10-CM | POA: Diagnosis not present

## 2018-04-03 DIAGNOSIS — R29898 Other symptoms and signs involving the musculoskeletal system: Secondary | ICD-10-CM | POA: Insufficient documentation

## 2018-04-03 DIAGNOSIS — E1142 Type 2 diabetes mellitus with diabetic polyneuropathy: Secondary | ICD-10-CM

## 2018-04-03 DIAGNOSIS — E1140 Type 2 diabetes mellitus with diabetic neuropathy, unspecified: Secondary | ICD-10-CM

## 2018-04-03 DIAGNOSIS — G720 Drug-induced myopathy: Secondary | ICD-10-CM | POA: Insufficient documentation

## 2018-04-03 DIAGNOSIS — M791 Myalgia, unspecified site: Secondary | ICD-10-CM | POA: Diagnosis not present

## 2018-04-03 LAB — CBC WITH DIFFERENTIAL/PLATELET
Basophils Absolute: 10 cells/uL (ref 0–200)
Basophils Relative: 0.2 %
Eosinophils Absolute: 187 cells/uL (ref 15–500)
Eosinophils Relative: 3.9 %
HCT: 34.3 % — ABNORMAL LOW (ref 35.0–45.0)
Hemoglobin: 10.2 g/dL — ABNORMAL LOW (ref 11.7–15.5)
Lymphs Abs: 1699 cells/uL (ref 850–3900)
MCH: 20 pg — ABNORMAL LOW (ref 27.0–33.0)
MCHC: 29.7 g/dL — ABNORMAL LOW (ref 32.0–36.0)
MCV: 67.1 fL — ABNORMAL LOW (ref 80.0–100.0)
MPV: 10.7 fL (ref 7.5–12.5)
Monocytes Relative: 8.1 %
Neutro Abs: 2515 cells/uL (ref 1500–7800)
Neutrophils Relative %: 52.4 %
Platelets: 240 10*3/uL (ref 140–400)
RBC: 5.11 10*6/uL — ABNORMAL HIGH (ref 3.80–5.10)
RDW: 16.3 % — ABNORMAL HIGH (ref 11.0–15.0)
Total Lymphocyte: 35.4 %
WBC mixed population: 389 cells/uL (ref 200–950)
WBC: 4.8 10*3/uL (ref 3.8–10.8)

## 2018-04-03 LAB — LIPID PANEL
Cholesterol: 118 mg/dL (ref <200)
HDL: 50 mg/dL — ABNORMAL LOW (ref >50)
LDL Cholesterol (Calc): 54 mg/dL (calc)
Non-HDL Cholesterol (Calc): 68 mg/dL (calc) (ref <130)
Total CHOL/HDL Ratio: 2.4 (calc) (ref <5.0)
Triglycerides: 60 mg/dL (ref <150)

## 2018-04-03 LAB — COMPLETE METABOLIC PANEL WITH GFR
AG Ratio: 1.5 (calc) (ref 1.0–2.5)
ALT: 14 U/L (ref 6–29)
AST: 15 U/L (ref 10–35)
Albumin: 4 g/dL (ref 3.6–5.1)
Alkaline phosphatase (APISO): 75 U/L (ref 33–130)
BUN: 21 mg/dL (ref 7–25)
CO2: 29 mmol/L (ref 20–32)
Calcium: 8.9 mg/dL (ref 8.6–10.4)
Chloride: 104 mmol/L (ref 98–110)
Creat: 0.82 mg/dL (ref 0.60–0.88)
GFR, Est African American: 75 mL/min/{1.73_m2} (ref >=60)
GFR, Est Non African American: 65 mL/min/{1.73_m2} (ref >=60)
Globulin: 2.7 g/dL (calc) (ref 1.9–3.7)
Glucose, Bld: 126 mg/dL — ABNORMAL HIGH (ref 65–99)
Potassium: 3.7 mmol/L (ref 3.5–5.3)
Sodium: 141 mmol/L (ref 135–146)
Total Bilirubin: 0.7 mg/dL (ref 0.2–1.2)
Total Protein: 6.7 g/dL (ref 6.1–8.1)

## 2018-04-03 LAB — HEMOGLOBIN A1C
Hgb A1c MFr Bld: 7.5 % of total Hgb — ABNORMAL HIGH (ref <5.7)
Mean Plasma Glucose: 169 (calc)
eAG (mmol/L): 9.3 (calc)

## 2018-04-03 LAB — URIC ACID: Uric Acid, Serum: 7 mg/dL (ref 2.5–7.0)

## 2018-04-03 MED ORDER — INDOMETHACIN 25 MG PO CAPS: 25.0000 mg | ORAL_CAPSULE | Freq: Three times a day (TID) | ORAL | 3 refills | Status: DC

## 2018-04-03 MED ORDER — LISINOPRIL 10 MG PO TABS: 10.0000 mg | ORAL_TABLET | Freq: Every day | ORAL | 3 refills | Status: DC

## 2018-04-03 NOTE — Assessment & Plan Note (Signed)
Stable chronic problem s/p CVA Affecting her function but seems to be still active at home, often works in garden

## 2018-04-03 NOTE — Assessment & Plan Note (Signed)
Still controlled DM at goal for her age, < 8, now it is 7.5 with some increase due to diet. Complications - peripheral neuropathy No hypoglycemia  Plan:  1. Not on any medication 2. Encourage improved lifestyle - low carb, low sugar diet, reduce portion size, continue improving regular exercise 3. Continue ASA, ACEi 5. Follow-up q 6 mo A1c

## 2018-04-03 NOTE — Assessment & Plan Note (Signed)
Controlled cholesterol off statin and some limited lifestyle Last lipid panel 02/2018 Calculated ASCVD 10 yr risk score elevated in DM and age  Plan: 1. Continue ASA 81mg  for secondary ASCVD risk reduction 2. Encourage improved lifestyle - low carb/cholesterol, reduce portion size, continue improving  3. Follow-up yearly lipid

## 2018-04-03 NOTE — Assessment & Plan Note (Signed)
History of prior intolerance to statin Indicated to take but unable to tolerate, defer restart now

## 2018-04-03 NOTE — Assessment & Plan Note (Signed)
Improved chronic anemia, with iron deficiency with low MCV but suspect some mixed component with normal iron stores, ferritin possibly some level of chronic disease Mostly presumed IDA with good response to ferrous sulfate Likely multifactorial, some dietary / nutritional as well.  Plan: 1. Continue ferrous sulfate OTC iron BID as tolerated 2. Improve nutrition, keep add protein supplement ensure/boost 3. Follow-up 6 months clinically then labs yearly

## 2018-04-03 NOTE — Patient Instructions (Addendum)
Thank you for coming to the office today.  Stop taking Lisinopril 5mg  daily  NEW pill start taking higher dose - Lisinopril 10mg  daily - sent to pharmacy  Continue iron pills - improved Anemia / Iron levels on blood test  Blood sugar mildly elevated  Keep up good work overall, stay active  REfilled gout medicine  Please schedule a Follow-up Appointment to: Return in about 6 months (around 10/03/2018) for DM A1c, Neuropathy, Anemia Iron, Gout.  If you have any other questions or concerns, please feel free to call the office or send a message through MyChart. You may also schedule an earlier appointment if necessary.  Additionally, you may be receiving a survey about your experience at our office within a few days to 1 week by e-mail or mail. We value your feedback.  Saralyn PilarAlexander Karamalegos, DO [facility]

## 2018-04-03 NOTE — Assessment & Plan Note (Signed)
Today mild elevated BP, previously has had lower readings - Home BP readings, none available still No known complications / History of CVA on ASA    Plan:  1. Continue current BP regimen - Lisinopril 5mg  daily 2. Encourage improved lifestyle - improve hydration, low sodium diet exercise 3. Follow-up 6 months

## 2018-04-03 NOTE — Assessment & Plan Note (Signed)
Relatively stable chronic problem with some mild flares Refilled Indomethicin for PRN NSAID as she states prefers to use this since it works, not interested in switching to alternative medication at this time - Last Uric acid 7.0

## 2018-04-03 NOTE — Progress Notes (Signed)
Subjective:    Patient ID: Gabriela Robles, female    DOB: Nov 06, 1928, 84 y.o.   MRN: [id number]  Gabriela Robles is a 84 y.o. female presenting on 04/03/2018 for Diabetes and Anemia  Patient's daughter, Alvino Blood, present here for interpretation. They have signed waiver, as no exact language/dialect could be provided to obtain in person interpreter or language line.  HPI   FOLLOW-UPCHRONIC HTN: - Today patient reportsdoing well. No BP checks outside office still. Current Meds - Lisinopril 5mg  daily in AM  Reports good compliance, took meds today. Tolerating well, w/o complaints. - Continue ASA 81mg  for risk reduction with prior CVA  Anemia, iron deficiency/ Fatigue: - Last visit with me 09/2017, for same problem,has had prior stable low Hgb, microcytic MCV, and interval iron anemia studies showed normal iron stores and ferritin, she was resumed on iron supplement and increased up to twice daily, see prior notes for background information. - Interval update with stilltolerating iron supplement well, no new concerns. Recent lab showed improved Hemoglobin up to 10.2, previously 9.3, otherwise still concern with anemia markers on lab. - Today patient reportsdoing well. Seems clinically improved still, not endorsing fatigue or other concerns - Still taking Iron pill OTC twice daily, tolerating - Regular exercises daily, active walks around house and outdoors in garden, very functional - Denies any episodes of bleeding, sleepiness, syncope, chest pain, or dyspnea, poor appetite, constipation, abdominal pain  CHRONIC DM, Type 2 - Diet Controlled/ DM Neuropathy Reports no concerns. Recently lab show A1c increased to 7.5, from prior 6.5 CBGs: No longer checking CBG. Meds: None Currently on ACEi Diet is balanced, often eats a lot of fruit. Some sweets still Denies hypoglycemia  Residual R upper extremity weakness, from CVA Reports chronic weakness of R grip and upper  extremity, without any recent changes  Gout, chronic Over past 6 months, she has had some flare up still at least 1 every 2-3 months, usually Right knee and or right great toe and 4th toe. Improved with NSAID PRN with good results, request refill. Last lab uric acid 7.5.  Health Maintenance: Declined vaccines  Depression screen [facility] 2/9 04/03/2018 09/30/2017 10/31/2016  Decreased Interest 0 0 0  Down, Depressed, Hopeless 0 0 0  PHQ - 2 Score 0 0 0    Past Medical History:  Diagnosis Date  . Arthritis   . Diabetes mellitus without complication (HCC)    History reviewed. No pertinent surgical history. Social History   Socioeconomic History  . Marital status: Widowed    Spouse name: Not on file  . Number of children: Not on file  . Years of education: Not on file  . Highest education level: Not on file  Occupational History  . Not on file  Social Needs  . Financial resource strain: Not hard at all  . Food insecurity:    Worry: Never true    Inability: Never true  . Transportation needs:    Medical: No    Non-medical: No  Tobacco Use  . Smoking status: Never Smoker  . Smokeless tobacco: Never Used  Substance and Sexual Activity  . Alcohol use: No    Alcohol/week: 0.0 oz  . Drug use: No  . Sexual activity: Not on file  Lifestyle  . Physical activity:    Days per week: 3 days    Minutes per session: 20 min  . Stress: Not at all  Relationships  . Social connections:    Talks on phone: More than three  times a week    Gets together: More than three times a week    Attends religious service: More than 4 times per year    Active member of club or organization: No    Attends meetings of clubs or organizations: Never    Relationship status: Widowed  . Intimate partner violence:    Fear of current or ex partner: No    Emotionally abused: No    Physically abused: No    Forced sexual activity: No  Other Topics Concern  . Not on file  Social History Narrative  . Not on  file   Family History  Family history unknown: Yes   Current Outpatient Medications on File Prior to Visit  Medication Sig  . Alpha-Lipoic Acid 600 MG CAPS Take 1 capsule (600 mg total) by mouth daily.  . [employer] ASPIRIN ADULT LOW DOSE 81 MG chewable tablet Chew 81 mg by mouth daily.  . ferrous sulfate 325 (65 FE) MG tablet Take 1 tablet (325 mg total) by mouth 2 (two) times daily with a meal.  . MULTIPLE VITAMINS-MINERALS ER PO Take by mouth.  . Omega-3 Fatty Acids (FISH OIL) 1000 MG CPDR Take by mouth.   No current facility-administered medications on file prior to visit.     Review of Systems  Constitutional: Negative for activity change, appetite change, chills, diaphoresis, fatigue and fever.  HENT: Negative for congestion, hearing loss and sinus pressure.   Eyes: Negative for visual disturbance.  Respiratory: Negative for apnea, cough, choking, chest tightness, shortness of breath and wheezing.   Cardiovascular: Negative for chest pain, palpitations and leg swelling.  Gastrointestinal: Negative for abdominal pain, anal bleeding, blood in stool, constipation, diarrhea, nausea and vomiting.  Endocrine: Negative for cold intolerance.  Genitourinary: Negative for difficulty urinating, dysuria, frequency and hematuria.  Musculoskeletal: Positive for arthralgias (Right knee pain and R foot pain). Negative for back pain and neck pain.  Skin: Negative for rash.  Allergic/Immunologic: Negative for environmental allergies.  Neurological: Positive for weakness (R hand, chronic post stroke) and numbness (R foot). Negative for dizziness, light-headedness and headaches.  Hematological: Negative for adenopathy.  Psychiatric/Behavioral: Negative for behavioral problems, dysphoric mood and sleep disturbance. The patient is not nervous/anxious.    Per HPI unless specifically indicated above     Objective:    BP (!) 155/60 (BP Location: Left Arm, Cuff Size: Normal)   Pulse (!) 57   Temp 98.1 F  (36.7 C) (Oral)   Resp 16   Ht 5' (1.524 m)   Wt 113 lb 12.8 oz (51.6 kg)   BMI 22.23 kg/m   Wt Readings from Last 3 Encounters:  04/03/18 113 lb 12.8 oz (51.6 kg)  09/30/17 116 lb 12.8 oz (53 kg)  09/30/17 116 lb 12.8 oz (53 kg)    Physical Exam  Constitutional: She is oriented to person, place, and time. She appears well-developed and well-nourished. No distress.  Well-appearing, comfortable, cooperative, thin appearing elderly 84 year old female  HENT:  Head: Normocephalic and atraumatic.  Mouth/Throat: Oropharynx is clear and moist.  Eyes: Pupils are equal, round, and reactive to light. Conjunctivae and EOM are normal. Right eye exhibits no discharge. Left eye exhibits no discharge.  Neck: Normal range of motion. Neck supple. No thyromegaly present.  Cardiovascular: Normal rate, regular rhythm, normal heart sounds and intact distal pulses.  No murmur heard. Pulmonary/Chest: Effort normal and breath sounds normal. No respiratory distress. She has no wheezes. She has no rales.  Abdominal: Soft.  Bowel sounds are normal. She exhibits no distension and no mass. There is no tenderness.  Musculoskeletal: She exhibits no edema or tenderness.  Upper / Lower Extremities:  Right upper extremity weak grip about 1 to 2 out of 5 strength, uses her left hand to assist with this grip and, has limited mobility of R arm against resistance or lifting above shoulder, otherwise can move at her side  Lymphadenopathy:    She has no cervical adenopathy.  Neurological: She is alert and oriented to person, place, and time.  Distal sensation intact to light touch all extremities  Skin: Skin is warm and dry. No rash noted. She is not diaphoretic. No erythema.  Psychiatric: She has a normal mood and affect. Her behavior is normal.  Well groomed, good eye contact, limited speech, does not speak English, does not seem to participate as much verbally during exam even through interpreter but demeanor is very  pleasant  Nursing note and vitals reviewed.    Diabetic Foot Exam - Simple   Simple Foot Form Diabetic Foot exam was performed with the following findings:  Yes 04/03/2018 10:15 AM  Visual Inspection See comments:  Yes Sensation Testing See comments:  Yes Pulse Check Posterior Tibialis and Dorsalis pulse intact bilaterally:  Yes Comments Right foot reduced sensation to monofilament generalized. Left is mostly preserved but difficult to determine exact locations. Bilateral with some great toenail thickening. Also with some early callus formation forefoot.     Results for orders placed or performed in visit on 04/02/18  Uric acid  Result Value Ref Range   Uric Acid, Serum 7.0 2.5 - 7.0 mg/dL  CBC with Differential/Platelet  Result Value Ref Range   WBC 4.8 3.8 - 10.8 Thousand/uL   RBC 5.11 (H) 3.80 - 5.10 Million/uL   Hemoglobin 10.2 (L) 11.7 - 15.5 g/dL   HCT 40.9 (L) 81.1 - 91.4 %   MCV 67.1 (L) 80.0 - 100.0 fL   MCH 20.0 (L) 27.0 - 33.0 pg   MCHC 29.7 (L) 32.0 - 36.0 g/dL   RDW 78.2 (H) 95.6 - 21.3 %   Platelets 240 140 - 400 Thousand/uL   MPV 10.7 7.5 - 12.5 fL   Neutro Abs 2,515 1,500 - 7,800 cells/uL   Lymphs Abs 1,699 850 - 3,900 cells/uL   WBC mixed population 389 200 - 950 cells/uL   Eosinophils Absolute 187 15 - 500 cells/uL   Basophils Absolute 10 0 - 200 cells/uL   Neutrophils Relative % 52.4 %   Total Lymphocyte 35.4 %   Monocytes Relative 8.1 %   Eosinophils Relative 3.9 %   Basophils Relative 0.2 %   Smear Review    Hemoglobin A1c  Result Value Ref Range   Hgb A1c MFr Bld 7.5 (H) <5.7 % of total Hgb   Mean Plasma Glucose 169 (calc)   eAG (mmol/L) 9.3 (calc)  Lipid panel  Result Value Ref Range   Cholesterol 118 <200 mg/dL   HDL 50 (L) >08 mg/dL   Triglycerides 60 <657 mg/dL   LDL Cholesterol (Calc) 54 mg/dL (calc)   Total CHOL/HDL Ratio 2.4 <5.0 (calc)   Non-HDL Cholesterol (Calc) 68 <846 mg/dL (calc)  COMPLETE METABOLIC PANEL WITH GFR  Result  Value Ref Range   Glucose, Bld 126 (H) 65 - 99 mg/dL   BUN 21 7 - 25 mg/dL   Creat 9.62 9.52 - 8.41 mg/dL   GFR, Est Non African American 65 > OR = 60 mL/min/1.53m2   GFR,  Est African American 75 > OR = 60 mL/min/1.8473m2   BUN/Creatinine Ratio NOT APPLICABLE 6 - 22 (calc)   Sodium 141 135 - 146 mmol/L   Potassium 3.7 3.5 - 5.3 mmol/L   Chloride 104 98 - 110 mmol/L   CO2 29 20 - 32 mmol/L   Calcium 8.9 8.6 - 10.4 mg/dL   Total Protein 6.7 6.1 - 8.1 g/dL   Albumin 4.0 3.6 - 5.1 g/dL   Globulin 2.7 1.9 - 3.7 g/dL (calc)   AG Ratio 1.5 1.0 - 2.5 (calc)   Total Bilirubin 0.7 0.2 - 1.2 mg/dL   Alkaline phosphatase (APISO) 75 33 - 130 U/L   AST 15 10 - 35 U/L   ALT 14 6 - 29 U/L      Assessment & Plan:   Problem List Items Addressed This Visit    Anemia    Improved chronic anemia, with iron deficiency with low MCV but suspect some mixed component with normal iron stores, ferritin possibly some level of chronic disease Mostly presumed IDA with good response to ferrous sulfate Likely multifactorial, some dietary / nutritional as well.  Plan: 1. Continue ferrous sulfate OTC iron BID as tolerated 2. Improve nutrition, keep add protein supplement ensure/boost 3. Follow-up 6 months clinically then labs yearly      Controlled diabetes mellitus with diabetic neuropathy (HCC)    Still controlled DM at goal for her age, < 8, now it is 7.5 with some increase due to diet. Complications - peripheral neuropathy No hypoglycemia  Plan:  1. Not on any medication 2. Encourage improved lifestyle - low carb, low sugar diet, reduce portion size, continue improving regular exercise 3. Continue ASA, ACEi 5. Follow-up q 6 mo A1c      Relevant Medications   lisinopril (PRINIVIL,ZESTRIL) 10 MG tablet   Diabetic peripheral neuropathy (HCC)    Persistent chronic problem DM control slightly worse up to A1c 7.5 with some hyperglycemia DM foot exam done, similar to prior      Relevant Medications    lisinopril (PRINIVIL,ZESTRIL) 10 MG tablet   Essential (primary) hypertension - Primary    Today mild elevated BP, previously has had lower readings - Home BP readings, none available still No known complications / History of CVA on ASA    Plan:  1. Continue current BP regimen - Lisinopril 5mg  daily 2. Encourage improved lifestyle - improve hydration, low sodium diet exercise 3. Follow-up 6 months      Relevant Medications   lisinopril (PRINIVIL,ZESTRIL) 10 MG tablet   Gout    Relatively stable chronic problem with some mild flares Refilled Indomethicin for PRN NSAID as she states prefers to use this since it works, not interested in switching to alternative medication at this time - Last Uric acid 7.0      Relevant Medications   indomethacin (INDOCIN) 25 MG capsule   History of cerebrovascular accident (CVA) with residual deficit    Stable without recurrence of further deficits Chronic weakness RUE primarily grip hand      HLD (hyperlipidemia)    Controlled cholesterol off statin and some limited lifestyle Last lipid panel 02/2018 Calculated ASCVD 10 yr risk score elevated in DM and age  Plan: 1. Continue ASA 81mg  for secondary ASCVD risk reduction 2. Encourage improved lifestyle - low carb/cholesterol, reduce portion size, continue improving  3. Follow-up yearly lipid       Relevant Medications   lisinopril (PRINIVIL,ZESTRIL) 10 MG tablet   Myalgia due to statin  History of prior intolerance to statin Indicated to take but unable to tolerate, defer restart now      Weakness of right upper extremity    Stable chronic problem s/p CVA Affecting her function but seems to be still active at home, often works in garden         Meds ordered this encounter  Medications  . lisinopril (PRINIVIL,ZESTRIL) 10 MG tablet    Sig: Take 1 tablet (10 mg total) by mouth daily.    Dispense:  90 tablet    Refill:  3    Dose increase 5 to 10mg   . indomethacin (INDOCIN) 25 MG  capsule    Sig: Take 1 capsule (25 mg total) by mouth 3 (three) times daily with meals. Take as needed for gout flair    Dispense:  30 capsule    Refill:  3    Follow up plan: Return in about 6 months (around 10/03/2018) for DM A1c, Neuropathy, Anemia Iron, Gout.  Saralyn Pilar, DO [facility] 04/03/2018, 9:53 PM

## 2018-04-03 NOTE — Assessment & Plan Note (Signed)
Persistent chronic problem DM control slightly worse up to A1c 7.5 with some hyperglycemia DM foot exam done, similar to prior

## 2018-04-03 NOTE — Assessment & Plan Note (Signed)
Stable without recurrence of further deficits Chronic weakness RUE primarily grip hand

## 2018-04-08 ENCOUNTER — Encounter: Payer: Medicare Other | Admitting: Family Medicine

## 2018-08-18 DIAGNOSIS — Z23 Encounter for immunization: Secondary | ICD-10-CM | POA: Diagnosis not present

## 2018-08-31 ENCOUNTER — Ambulatory Visit (INDEPENDENT_AMBULATORY_CARE_PROVIDER_SITE_OTHER): Payer: Medicare Other | Admitting: Family Medicine

## 2018-08-31 ENCOUNTER — Encounter: Payer: Self-pay | Admitting: Family Medicine

## 2018-08-31 VITALS — BP 175/52 | HR 57 | Temp 98.3°F | Resp 16 | Ht 60.0 in | Wt 112.6 lb

## 2018-08-31 DIAGNOSIS — M109 Gout, unspecified: Secondary | ICD-10-CM

## 2018-08-31 DIAGNOSIS — M1A09X0 Idiopathic chronic gout, multiple sites, without tophus (tophi): Secondary | ICD-10-CM

## 2018-08-31 MED ORDER — INDOMETHACIN 25 MG PO CAPS: 25.0000 mg | ORAL_CAPSULE | Freq: Three times a day (TID) | ORAL | 2 refills | Status: DC

## 2018-08-31 NOTE — Patient Instructions (Addendum)
Thank you for coming to the office today.  Start Indomethicin (Indocin) 25mg  capsule - take one with food 3 times a day for next 3 to 7 days  If improved after 3 days and symptoms 100% gone - can stop.  If symptoms NOT improved at all or WORSE - then call our office and we can phone in PREDNISONE steroid pill to take instead of this medicine. You would STOP taking Indocin for now, and START Prednisone.  Please schedule a Follow-up Appointment to: Return in about 4 weeks (around 09/28/2018), or if symptoms worsen or fail to improve, for Follow-up as scheduled 1 month for routine follow-up 10/05/18.  If you have any other questions or concerns, please feel free to call the office or send a message through MyChart. You may also schedule an earlier appointment if necessary.  Additionally, you may be receiving a survey about your experience at our office within a few days to 1 week by e-mail or mail. We value your feedback.  Saralyn Pilar, DO [facility], [place]

## 2018-08-31 NOTE — Progress Notes (Signed)
Subjective:    Patient ID: Gabriela Robles, female    DOB: [birth year], 84 y.o.   MRN: [id number]  Gabriela Robles is a 84 y.o. female presenting on 08/31/2018 for Gout (right side as per daughterpain radiate from foot to Right arm)  Patient presents for a same day appointment. history provided by family today.  HPI   Acute Gout Flare, multiple locations Reported onset over past week on 10/31 for 2-3 days with R ankle and foot swelling, redness, warm, and pain similar to previous gout flares and then improved and symptoms transitioned to R wrist and R fingers - seems that R ankle and foot improved but still tender, now has pain and swelling of R wrist and fingers. She has taken Tylenol PRN with only temporary relief, but did not have the Indomethicin 25mg  capsules anymore, this was rx back in 03/2018 but it was not filled by her report they did not have this medicine, it has worked before for gout for her. She has history of diabetes, has not been on prednisone before for this issue or other med such as colchicine that they can recall. - last uric acid 7.0 (03/2018) - Denies any fevers chills sweats, ulceration, injury trauma, fall   Depression screen [facility] 2/9 04/03/2018 09/30/2017 10/31/2016  Decreased Interest 0 0 0  Down, Depressed, Hopeless 0 0 0  PHQ - 2 Score 0 0 0    Social History   Tobacco Use  . Smoking status: Never Smoker  . Smokeless tobacco: Never Used  Substance Use Topics  . Alcohol use: No    Alcohol/week: 0.0 standard drinks  . Drug use: No    Review of Systems Per HPI unless specifically indicated above     Objective:    BP (!) 175/52   Pulse (!) 57   Temp 98.3 F (36.8 C) (Oral)   Resp 16   Ht 5' (1.524 m)   Wt 112 lb 9.6 oz (51.1 kg)   BMI 21.99 kg/m   Wt Readings from Last 3 Encounters:  08/31/18 112 lb 9.6 oz (51.1 kg)  04/03/18 113 lb 12.8 oz (51.6 kg)  09/30/17 116 lb 12.8 oz (53 kg)    Physical Exam  Constitutional: She is oriented to  person, place, and time. She appears well-developed and well-nourished. No distress.  Well-appearing, uncomfortable due to pain, cooperative  HENT:  Head: Normocephalic and atraumatic.  Mouth/Throat: Oropharynx is clear and moist.  Eyes: Conjunctivae are normal. Right eye exhibits no discharge. Left eye exhibits no discharge.  Cardiovascular: Normal rate.  Pulmonary/Chest: Effort normal.  Musculoskeletal: She exhibits edema.  Right ankle / foot: - Improved appearance now no edema or erythema, still mild tender to palpation over lateral ankle and mid foot - mobility of foot in preserved, full range of motion, able to weight bear, has cane for assistance  Right wrist / fingers - Middle and index finger and MCP region with some edema without erythema, some warmth, tender to touch - Tender over dorsal wrist and forearm, no erythema present at that location - No ecchymosis  Neurological: She is alert and oriented to person, place, and time.  Skin: Skin is warm and dry. No rash noted. She is not diaphoretic. No erythema.  Psychiatric: She has a normal mood and affect. Her behavior is normal.  Well groomed, good eye contact, normal speech and thoughts  Nursing note and vitals reviewed.    Results for orders placed or performed in visit on 04/02/18  Uric  acid  Result Value Ref Range   Uric Acid, Serum 7.0 2.5 - 7.0 mg/dL  CBC with Differential/Platelet  Result Value Ref Range   WBC 4.8 3.8 - 10.8 Thousand/uL   RBC 5.11 (H) 3.80 - 5.10 Million/uL   Hemoglobin 10.2 (L) 11.7 - 15.5 g/dL   HCT 16.1 (L) 09.6 - 04.5 %   MCV 67.1 (L) 80.0 - 100.0 fL   MCH 20.0 (L) 27.0 - 33.0 pg   MCHC 29.7 (L) 32.0 - 36.0 g/dL   RDW 40.9 (H) 81.1 - 91.4 %   Platelets 240 140 - 400 Thousand/uL   MPV 10.7 7.5 - 12.5 fL   Neutro Abs 2,515 1,500 - 7,800 cells/uL   Lymphs Abs 1,699 850 - 3,900 cells/uL   WBC mixed population 389 200 - 950 cells/uL   Eosinophils Absolute 187 15 - 500 cells/uL   Basophils  Absolute 10 0 - 200 cells/uL   Neutrophils Relative % 52.4 %   Total Lymphocyte 35.4 %   Monocytes Relative 8.1 %   Eosinophils Relative 3.9 %   Basophils Relative 0.2 %   Smear Review    Hemoglobin A1c  Result Value Ref Range   Hgb A1c MFr Bld 7.5 (H) <5.7 % of total Hgb   Mean Plasma Glucose 169 (calc)   eAG (mmol/L) 9.3 (calc)  Lipid panel  Result Value Ref Range   Cholesterol 118 <200 mg/dL   HDL 50 (L) >78 mg/dL   Triglycerides 60 <295 mg/dL   LDL Cholesterol (Calc) 54 mg/dL (calc)   Total CHOL/HDL Ratio 2.4 <5.0 (calc)   Non-HDL Cholesterol (Calc) 68 <621 mg/dL (calc)  COMPLETE METABOLIC PANEL WITH GFR  Result Value Ref Range   Glucose, Bld 126 (H) 65 - 99 mg/dL   BUN 21 7 - 25 mg/dL   Creat 3.08 6.57 - 8.46 mg/dL   GFR, Est Non African American 65 > OR = 60 mL/min/1.68m2   GFR, Est African American 75 > OR = 60 mL/min/1.4m2   BUN/Creatinine Ratio NOT APPLICABLE 6 - 22 (calc)   Sodium 141 135 - 146 mmol/L   Potassium 3.7 3.5 - 5.3 mmol/L   Chloride 104 98 - 110 mmol/L   CO2 29 20 - 32 mmol/L   Calcium 8.9 8.6 - 10.4 mg/dL   Total Protein 6.7 6.1 - 8.1 g/dL   Albumin 4.0 3.6 - 5.1 g/dL   Globulin 2.7 1.9 - 3.7 g/dL (calc)   AG Ratio 1.5 1.0 - 2.5 (calc)   Total Bilirubin 0.7 0.2 - 1.2 mg/dL   Alkaline phosphatase (APISO) 75 33 - 130 U/L   AST 15 10 - 35 U/L   ALT 14 6 - 29 U/L      Assessment & Plan:   Problem List Items Addressed This Visit    Gout - Primary   Relevant Medications   indomethacin (INDOCIN) 25 MG capsule      Meds ordered this encounter  Medications  . indomethacin (INDOCIN) 25 MG capsule    Sig: Take 1 capsule (25 mg total) by mouth 3 (three) times daily with meals. Take as needed for gout flair up to 1 week    Dispense:  30 capsule    Refill:  2   Clinically consistent with acute gout flare of R wrist / fingers (previous flare within past week of R ankle/foot - now improved), uncertain trigger, known recurrent chronic gout flares in  past, none recently. - Last uric acid level 7.0 (03/2018)  Plan: 1. RE ORDER existing Indomethicin (Indocin) 25mg  TID PRN - for 3-7 days for now for acute flare - Discussed alternative options may consider Prednisone, however caution with type 2 diabetes, and duration of symptoms already >3-5 years, may benefit most from NSAID for pain relief at this point - Notify office within 48 hours if not improved can try to phone in prednisone as an alternative - Avoid excessive ambulation, relative rest, ice if helps, can take Tylenol PRN Avoid food triggers (red meat, alcohol) Follow-up within 4 weeks once acute flare resolved, discuss uric acid lower therapy and prevention  Follow up plan: Return in about 4 weeks (around 09/28/2018), or if symptoms worsen or fail to improve, for Follow-up as scheduled 1 month for routine follow-up 10/05/18.  Saralyn Pilar, DO [facility] 08/31/2018, 2:55 PM

## 2018-10-05 ENCOUNTER — Ambulatory Visit: Payer: Medicare Other | Admitting: Family Medicine

## 2018-10-06 ENCOUNTER — Other Ambulatory Visit: Payer: Self-pay | Admitting: Family Medicine

## 2018-10-06 ENCOUNTER — Ambulatory Visit (INDEPENDENT_AMBULATORY_CARE_PROVIDER_SITE_OTHER): Payer: Medicare Other | Admitting: Family Medicine

## 2018-10-06 ENCOUNTER — Ambulatory Visit (INDEPENDENT_AMBULATORY_CARE_PROVIDER_SITE_OTHER): Payer: Medicare Other

## 2018-10-06 ENCOUNTER — Encounter: Payer: Self-pay | Admitting: Family Medicine

## 2018-10-06 VITALS — BP 126/46 | HR 56 | Temp 97.9°F | Ht 60.0 in | Wt 113.0 lb

## 2018-10-06 VITALS — BP 126/46 | HR 56 | Temp 97.9°F | Resp 16 | Ht 60.0 in | Wt 113.0 lb

## 2018-10-06 DIAGNOSIS — I10 Essential (primary) hypertension: Secondary | ICD-10-CM

## 2018-10-06 DIAGNOSIS — E1142 Type 2 diabetes mellitus with diabetic polyneuropathy: Secondary | ICD-10-CM

## 2018-10-06 DIAGNOSIS — Z0000 Encounter for general adult medical examination without abnormal findings: Secondary | ICD-10-CM

## 2018-10-06 DIAGNOSIS — M1A09X0 Idiopathic chronic gout, multiple sites, without tophus (tophi): Secondary | ICD-10-CM

## 2018-10-06 DIAGNOSIS — Z13820 Encounter for screening for osteoporosis: Secondary | ICD-10-CM | POA: Diagnosis not present

## 2018-10-06 DIAGNOSIS — E782 Mixed hyperlipidemia: Secondary | ICD-10-CM

## 2018-10-06 DIAGNOSIS — E2839 Other primary ovarian failure: Secondary | ICD-10-CM

## 2018-10-06 DIAGNOSIS — Z23 Encounter for immunization: Secondary | ICD-10-CM

## 2018-10-06 DIAGNOSIS — D508 Other iron deficiency anemias: Secondary | ICD-10-CM

## 2018-10-06 DIAGNOSIS — E1140 Type 2 diabetes mellitus with diabetic neuropathy, unspecified: Secondary | ICD-10-CM

## 2018-10-06 DIAGNOSIS — R718 Other abnormality of red blood cells: Secondary | ICD-10-CM

## 2018-10-06 DIAGNOSIS — I6930 Unspecified sequelae of cerebral infarction: Secondary | ICD-10-CM

## 2018-10-06 LAB — POCT GLYCOSYLATED HEMOGLOBIN (HGB A1C): Hemoglobin A1C: 7.1 % — AB (ref 4.0–5.6)

## 2018-10-06 NOTE — Patient Instructions (Signed)
Gabriela Robles , Thank you for taking time to come for your Medicare Wellness Visit. I appreciate your ongoing commitment to your health goals. Please review the following plan we discussed and let me know if I can assist you in the future.   Screening recommendations/referrals: Colonoscopy: No longer required.  Mammogram: No longer required.  Bone Density: Ordered today. Pt made aware office will call to set up apt.  Recommended yearly ophthalmology/optometry visit for glaucoma screening and checkup Recommended yearly dental visit for hygiene and checkup  Vaccinations: Influenza vaccine: Up to date Pneumococcal vaccine: Prevnar 13 administered today. Tdap vaccine: Pt declines today.  Shingles vaccine: Pt declines today.     Advanced directives: Advance directive discussed with you today. Even though you declined this today please call our office should you change your mind and we can give you the proper paperwork for you to fill out.  Conditions/risks identified: Continue trying to increase water intake to 6-8 glasses a day.   Next appointment: None, pt declined scheduling future apts at this time.    Preventive Care 7765 Years and Older, Female Preventive care refers to lifestyle choices and visits with your health care provider that can promote health and wellness. What does preventive care include?  A yearly physical exam. This is also called an annual well check.  Dental exams once or twice a year.  Routine eye exams. Ask your health care provider how often you should have your eyes checked.  Personal lifestyle choices, including:  Daily care of your teeth and gums.  Regular physical activity.  Eating a healthy diet.  Avoiding tobacco and drug use.  Limiting alcohol use.  Practicing safe sex.  Taking low-dose aspirin every day.  Taking vitamin and mineral supplements as recommended by your health care provider. What happens during an annual well check? The  services and screenings done by your health care provider during your annual well check will depend on your age, overall health, lifestyle risk factors, and family history of disease. Counseling  Your health care provider may ask you questions about your:  Alcohol use.  Tobacco use.  Drug use.  Emotional well-being.  Home and relationship well-being.  Sexual activity.  Eating habits.  History of falls.  Memory and ability to understand (cognition).  Work and work Astronomerenvironment.  Reproductive health. Screening  You may have the following tests or measurements:  Height, weight, and BMI.  Blood pressure.  Lipid and cholesterol levels. These may be checked every 5 years, or more frequently if you are over 876 years old.  Skin check.  Lung cancer screening. You may have this screening every year starting at age 82 if you have a 30-pack-year history of smoking and currently smoke or have quit within the past 15 years.  Fecal occult blood test (FOBT) of the stool. You may have this test every year starting at age 82.  Flexible sigmoidoscopy or colonoscopy. You may have a sigmoidoscopy every 5 years or a colonoscopy every 10 years starting at age 82.  Hepatitis C blood test.  Hepatitis B blood test.  Sexually transmitted disease (STD) testing.  Diabetes screening. This is done by checking your blood sugar (glucose) after you have not eaten for a while (fasting). You may have this done every 1-3 years.  Bone density scan. This is done to screen for osteoporosis. You may have this done starting at age 82.  Mammogram. This may be done every 1-2 years. Talk to your health care provider about  how often you should have regular mammograms. Talk with your health care provider about your test results, treatment options, and if necessary, the need for more tests. Vaccines  Your health care provider may recommend certain vaccines, such as:  Influenza vaccine. This is recommended  every year.  Tetanus, diphtheria, and acellular pertussis (Tdap, Td) vaccine. You may need a Td booster every 10 years.  Zoster vaccine. You may need this after age 43.  Pneumococcal 13-valent conjugate (PCV13) vaccine. One dose is recommended after age 39.  Pneumococcal polysaccharide (PPSV23) vaccine. One dose is recommended after age 69. Talk to your health care provider about which screenings and vaccines you need and how often you need them. This information is not intended to replace advice given to you by your health care provider. Make sure you discuss any questions you have with your health care provider. Document Released: 11/10/2015 Document Revised: 07/03/2016 Document Reviewed: 08/15/2015 Elsevier Interactive Patient Education  2017 [place] Prevention in the Home Falls can cause injuries. They can happen to people of all ages. There are many things you can do to make your home safe and to help prevent falls. What can I do on the outside of my home?  Regularly fix the edges of walkways and driveways and fix any cracks.  Remove anything that might make you trip as you walk through a door, such as a raised step or threshold.  Trim any bushes or trees on the path to your home.  Use bright outdoor lighting.  Clear any walking paths of anything that might make someone trip, such as rocks or tools.  Regularly check to see if handrails are loose or broken. Make sure that both sides of any steps have handrails.  Any raised decks and porches should have guardrails on the edges.  Have any leaves, snow, or ice cleared regularly.  Use sand or salt on walking paths during winter.  Clean up any spills in your garage right away. This includes oil or grease spills. What can I do in the bathroom?  Use night lights.  Install grab bars by the toilet and in the tub and shower. Do not use towel bars as grab bars.  Use non-skid mats or decals in the tub or shower.  If  you need to sit down in the shower, use a plastic, non-slip stool.  Keep the floor dry. Clean up any water that spills on the floor as soon as it happens.  Remove soap buildup in the tub or shower regularly.  Attach bath mats securely with double-sided non-slip rug tape.  Do not have throw rugs and other things on the floor that can make you trip. What can I do in the bedroom?  Use night lights.  Make sure that you have a light by your bed that is easy to reach.  Do not use any sheets or blankets that are too big for your bed. They should not hang down onto the floor.  Have a firm chair that has side arms. You can use this for support while you get dressed.  Do not have throw rugs and other things on the floor that can make you trip. What can I do in the kitchen?  Clean up any spills right away.  Avoid walking on wet floors.  Keep items that you use a lot in easy-to-reach places.  If you need to reach something above you, use a strong step stool that has a grab bar.  Keep  electrical cords out of the way.  Do not use floor polish or wax that makes floors slippery. If you must use wax, use non-skid floor wax.  Do not have throw rugs and other things on the floor that can make you trip. What can I do with my stairs?  Do not leave any items on the stairs.  Make sure that there are handrails on both sides of the stairs and use them. Fix handrails that are broken or loose. Make sure that handrails are as long as the stairways.  Check any carpeting to make sure that it is firmly attached to the stairs. Fix any carpet that is loose or worn.  Avoid having throw rugs at the top or bottom of the stairs. If you do have throw rugs, attach them to the floor with carpet tape.  Make sure that you have a light switch at the top of the stairs and the bottom of the stairs. If you do not have them, ask someone to add them for you. What else can I do to help prevent falls?  Wear shoes  that:  Do not have high heels.  Have rubber bottoms.  Are comfortable and fit you well.  Are closed at the toe. Do not wear sandals.  If you use a stepladder:  Make sure that it is fully opened. Do not climb a closed stepladder.  Make sure that both sides of the stepladder are locked into place.  Ask someone to hold it for you, if possible.  Clearly mark and make sure that you can see:  Any grab bars or handrails.  First and last steps.  Where the edge of each step is.  Use tools that help you move around (mobility aids) if they are needed. These include:  Canes.  Walkers.  Scooters.  Crutches.  Turn on the lights when you go into a dark area. Replace any light bulbs as soon as they burn out.  Set up your furniture so you have a clear path. Avoid moving your furniture around.  If any of your floors are uneven, fix them.  If there are any pets around you, be aware of where they are.  Review your medicines with your doctor. Some medicines can make you feel dizzy. This can increase your chance of falling. Ask your doctor what other things that you can do to help prevent falls. This information is not intended to replace advice given to you by your health care provider. Make sure you discuss any questions you have with your health care provider. Document Released: 08/10/2009 Document Revised: 03/21/2016 Document Reviewed: 11/18/2014 Elsevier Interactive Patient Education  2017 [employer].

## 2018-10-06 NOTE — Progress Notes (Signed)
Subjective:   Gabriela Robles is a 84 y.o. female who presents for Medicare Annual (Subsequent) preventive examination.  Review of Systems:  N/A  Cardiac Risk Factors include: advanced age (>66men, >80 women);diabetes mellitus;dyslipidemia;hypertension     Objective:     Vitals: BP (!) 126/46 (BP Location: Right Arm)   Pulse (!) 56   Temp 97.9 F (36.6 C) (Oral)   Ht 5' (1.524 m)   Wt 113 lb (51.3 kg)   BMI 22.07 kg/m   Body mass index is 22.07 kg/m.  Advanced Directives 10/06/2018 09/30/2017  Does Patient Have a Medical Advance Directive? No No  Would patient like information on creating a medical advance directive? No - Patient declined Yes (MAU/Ambulatory/Procedural Areas - Information given)    Tobacco Social History   Tobacco Use  Smoking Status Never Smoker  Smokeless Tobacco Never Used     Counseling given: Not Answered   Clinical Intake:  Pre-visit preparation completed: Yes  Pain : No/denies pain Pain Score: 0-No pain    Diabetes:  Is the patient diabetic?  Yes type 2 If diabetic, was a CBG obtained today?  No  Did the patient bring in their glucometer from home?  No  How often do you monitor your CBG's? Does not.   Financial Strains and Diabetes Management:  Are you having any financial strains with the device, your supplies or your medication? No .  Does the patient want to be seen by Chronic Care Management for management of their diabetes?  No  Would the patient like to be referred to a Nutritionist or for Diabetic Management?  No   Diabetic Exams:  Diabetic Eye Exam: Completed 10/25/13. Overdue for diabetic eye exam. Pt has been advised about the importance in completing this exam. Pt to scheduled exam within the next year.  Diabetic Foot Exam: Completed 04/03/18.    Nutritional Status: BMI of 19-24  Normal Nutritional Risks: None   How often do you need to have someone help you when you read instructions, pamphlets, or other  written materials from your doctor or pharmacy?: 1 - Never  Interpreter Needed?: No  Information entered by :: [facility], LPN  Past Medical History:  Diagnosis Date  . Arthritis    No past surgical history on file. Family History  Problem Relation Age of Onset  . Cancer Daughter        bone cancer   Social History   Socioeconomic History  . Marital status: Widowed    Spouse name: Not on file  . Number of children: 10  . Years of education: Not on file  . Highest education level: Not on file  Occupational History  . Occupation: retired  Engineer, production  . Financial resource strain: Not hard at all  . Food insecurity:    Worry: Never true    Inability: Never true  . Transportation needs:    Medical: No    Non-medical: No  Tobacco Use  . Smoking status: Never Smoker  . Smokeless tobacco: Never Used  Substance and Sexual Activity  . Alcohol use: No    Alcohol/week: 0.0 standard drinks  . Drug use: No  . Sexual activity: Not on file  Lifestyle  . Physical activity:    Days per week: 0 days    Minutes per session: 0 min  . Stress: Not at all  Relationships  . Social connections:    Talks on phone: More than three times a week    Gets together: More  than three times a week    Attends religious service: More than 4 times per year    Active member of club or organization: No    Attends meetings of clubs or organizations: Never    Relationship status: Widowed  Other Topics Concern  . Not on file  Social History Narrative   5 children living and 5 children deceased.     Outpatient Encounter Medications as of 10/06/2018  Medication Sig  . Alpha-Lipoic Acid 600 MG CAPS Take 1 capsule (600 mg total) by mouth daily.  . [facility] ASPIRIN ADULT LOW DOSE 81 MG chewable tablet Chew 81 mg by mouth daily.  . ferrous sulfate 325 (65 FE) MG tablet Take 1 tablet (325 mg total) by mouth 2 (two) times daily with a meal.  . indomethacin (INDOCIN) 25 MG capsule Take 1 capsule (25 mg  total) by mouth 3 (three) times daily with meals. Take as needed for gout flair up to 1 week  . lisinopril (PRINIVIL,ZESTRIL) 10 MG tablet Take 1 tablet (10 mg total) by mouth daily.  . MULTIPLE VITAMINS-MINERALS ER PO Take by mouth.  . Omega-3 Fatty Acids (FISH OIL) 1000 MG CPDR Take by mouth.   No facility-administered encounter medications on file as of 10/06/2018.     Activities of Daily Living In your present state of health, do you have any difficulty performing the following activities: 10/06/2018 10/06/2018  Hearing? Y Y  Comment Does not wear hearing aids.  -  Vision? N Y  Comment Wears eye glasses.  -  Difficulty concentrating or making decisions? N N  Walking or climbing stairs? N Y  Comment - climbing stair  Dressing or bathing? N N  Doing errands, shopping? Y N  Comment Does not drive. -  Preparing Food and eating ? N -  Using the Toilet? N -  In the past six months, have you accidently leaked urine? Y -  Comment Occasionally with pressure, wears protection daily.  -  Do you have problems with loss of bowel control? N -  Managing your Medications? N -  Managing your Finances? Y -  Comment Brother manages finances.  -  Housekeeping or managing your Housekeeping? Y -  Comment Daughter manages housework.  -  Some recent data might be hidden    Patient Care Team: Smitty Cords, DO as PCP - General (Family Medicine)    Assessment:   This is a routine wellness examination for Gabriela Robles.  Exercise Activities and Dietary recommendations Current Exercise Habits: The patient does not participate in regular exercise at present, Exercise limited by: None identified  Goals    . DIET - INCREASE WATER INTAKE     Recommend drinking at least 6-8 glasses of water a day        Fall Risk Fall Risk  10/06/2018 10/06/2018 04/03/2018 09/30/2017 10/31/2016  Falls in the past year? 0 0 No No No  Follow up - Falls evaluation completed - - -   FALL RISK PREVENTION PERTAINING  TO THE HOME:  Any stairs in or around the home WITH handrails? No  Home free of loose throw rugs in walkways, pet beds, electrical cords, etc? Yes  Adequate lighting in your home to reduce risk of falls? Yes   ASSISTIVE DEVICES UTILIZED TO PREVENT FALLS:  Life alert? No  Use of a cane, walker or w/c? Yes  Grab bars in the bathroom? No  Shower chair or bench in shower? No  Elevated toilet seat or a  handicapped toilet? No    TIMED UP AND GO:  Was the test performed? No .     Depression Screen PHQ 2/9 Scores 10/06/2018 10/06/2018 04/03/2018 09/30/2017  PHQ - 2 Score 0 0 0 0     Cognitive Function: Pt declined today.         Immunization History  Administered Date(s) Administered  . Influenza, High Dose Seasonal PF 07/24/2015, 08/18/2018  . Influenza-Unspecified 08/22/2017    Qualifies for Shingles Vaccine? Yes . Due for Shingrix. Education has been provided regarding the importance of this vaccine. Pt has been advised to call insurance company to determine out of pocket expense. Advised may also receive vaccine at local pharmacy or Health Dept. Verbalized acceptance and understanding.  Tdap: Although this vaccine is not a covered service during a Wellness Exam, does the patient still wish to receive this vaccine today?  No .  Education has been provided regarding the importance of this vaccine. Advised may receive this vaccine at local pharmacy or Health Dept. Aware to provide a copy of the vaccination record if obtained from local pharmacy or Health Dept. Verbalized acceptance and understanding.  Flu Vaccine: Up to date  Pneumococcal Vaccine: Due for Pneumococcal vaccine. Does the patient want to receive this vaccine today?  Yes .    Screening Tests Health Maintenance  Topic Date Due  . OPHTHALMOLOGY EXAM  07/07/1939  . DEXA SCAN  04/04/2019 (Originally 07/07/1996)  . TETANUS/TDAP  10/07/2019 (Originally 07/07/1948)  . FOOT EXAM  04/04/2019  . HEMOGLOBIN A1C  04/07/2019   . PNA vac Low Risk Adult (2 of 2 - PPSV23) 10/07/2019  . INFLUENZA VACCINE  Completed    Cancer Screenings:  Colorectal Screening: No longer required.   Mammogram: No longer required.   Bone Density: Ordered today. Pt aware the office will call re: appt.  Lung Cancer Screening: (Low Dose CT Chest recommended if Age 62-80 years, 30 pack-year currently smoking OR have quit w/in 15years.) does not qualify.    Additional Screening:  Vision Screening: Recommended annual ophthalmology exams for early detection of glaucoma and other disorders of the eye.  Dental Screening: Recommended annual dental exams for proper oral hygiene  Community Resource Referral:  CRR required this visit?  No       Plan:  I have personally reviewed and addressed the Medicare Annual Wellness questionnaire and have noted the following in the patient's chart:  A. Medical and social history B. Use of alcohol, tobacco or illicit drugs  C. Current medications and supplements D. Functional ability and status E.  Nutritional status F.  Physical activity G. Advance directives H. List of other physicians I.  Hospitalizations, surgeries, and ER visits in previous 12 months J.  Vitals K. Screenings such as hearing and vision if needed, cognitive and depression L. Referrals and appointments - none  In addition, I have reviewed and discussed with patient certain preventive protocols, quality metrics, and best practice recommendations. A written personalized care plan for preventive services as well as general preventive health recommendations were provided to patient.  See attached scanned questionnaire for additional information.   Signed,  Hyacinth MeekerMckenzie Dana Debo, LPN Nurse Health Advisor   Nurse Recommendations: Pt plans to set up an eye exam this year. Pt declined the tetanus vaccine today. DEXA order sent today.

## 2018-10-06 NOTE — Progress Notes (Signed)
Subjective:    Patient ID: Gabriela Robles, female    DOB: 02-Mar-1929, 84 y.o.   MRN: [id number]  Gabriela Robles is a 84 y.o. female presenting on 10/06/2018 for Diabetes  Patient presents for a same day appointment. history provided by family today.  Additionally patient has seen Arvilla MeresMcKenzie Mackoski, LPN for [employer] today  HPI   FOLLOW-UPCHRONIC HTN: - Today patient reportsdoing well.No BP checks outside office still. Current Meds - Lisinopril 5mg  daily in AM  Reports good compliance, took meds today. Tolerating well, w/o complaints. - Continue ASA 81mg  for risk reduction with prior CVA  Anemia, iron deficiency/ Fatigue: See prior notes for history of anemia and lab results. Has improved on last lab check Hgb. - Today feels well. Improved overall, no reduced energy. - Still taking Iron pill OTC twice daily, tolerating - Regular exercises daily, active walks around house and outdoors in garden, very functional - Denies any episodes of bleeding, sleepiness, syncope, chest pain, or dyspnea, poor appetite, constipation, abdominal pain  CHRONIC DM, Type 2 - Diet Controlled/ DM Neuropathy Reports no concerns. Recently lab show A1c improved down to 7.1, previously 7.5 CBGs: No longer checking CBG. Meds: None Currently on ACEi Diet is balanced, some sweets still Denies hypoglycemia  PMH - Residual R upper extremity weakness, from CVA Reports chronic weakness of R grip and upper extremity, without any recent changes  Follow-up Gout, R Foot / Ankle - last visit 08/31/18, treated with Indocin for gout, it resolved within 2 days. No further gout flare. Last Uric Acid was elevated previously.  Depression screen [facility] 2/9 10/06/2018 10/06/2018 04/03/2018  Decreased Interest 0 0 0  Down, Depressed, Hopeless 0 0 0  PHQ - 2 Score 0 0 0    Social History   Tobacco Use  . Smoking status: Never Smoker  . Smokeless tobacco: Never Used  Substance Use Topics    . Alcohol use: No    Alcohol/week: 0.0 standard drinks  . Drug use: No    Review of Systems Per HPI unless specifically indicated above     Objective:    BP (!) 126/46   Pulse (!) 56   Temp 97.9 F (36.6 C) (Oral)   Resp 16   Ht 5' (1.524 m)   Wt 113 lb (51.3 kg)   BMI 22.07 kg/m   Wt Readings from Last 3 Encounters:  10/06/18 113 lb (51.3 kg)  10/06/18 113 lb (51.3 kg)  08/31/18 112 lb 9.6 oz (51.1 kg)    Physical Exam  Constitutional: She is oriented to person, place, and time. She appears well-developed and well-nourished. No distress.  Well-appearing, comfortable, cooperative, thin appearing elderly 84 year old female  HENT:  Head: Normocephalic and atraumatic.  Mouth/Throat: Oropharynx is clear and moist.  Eyes: Conjunctivae are normal.  Cardiovascular: Normal rate, regular rhythm, normal heart sounds and intact distal pulses.  No murmur heard. Pulmonary/Chest: Effort normal and breath sounds normal. No respiratory distress. She has no wheezes. She has no rales.  Musculoskeletal: She exhibits no edema or tenderness.  Right lower extremity - Foot/ankle appears normal, without edema, deformity, non tender, no erythema  Neurological: She is alert and oriented to person, place, and time.  Distal sensation intact to light touch all extremities  Skin: Skin is warm and dry. No rash noted. She is not diaphoretic. No erythema.  Psychiatric: She has a normal mood and affect. Her behavior is normal.  Well groomed, good eye contact, limited speech, does not  speak English, does not seem to participate as much verbally during exam even through interpreter but demeanor is very pleasant  Nursing note and vitals reviewed.  Results for orders placed or performed in visit on 10/06/18  POCT HgB A1C  Result Value Ref Range   Hemoglobin A1C 7.1 (A) 4.0 - 5.6 %   Recent Labs    04/02/18 0838 10/06/18 1501  HGBA1C 7.5* 7.1*      Assessment & Plan:   Problem List Items  Addressed This Visit    Anemia    Improved Clinically stable and Asymptomatic Continue on oral iron BID as tolerated Follow-up as planned 6 months, lab      Controlled diabetes mellitus with diabetic neuropathy (HCC) - Primary    Well-controlled DM with A1c 7.1 at goal Complications - peripheral neuropathy  Plan:  1. Not on any medication 2. Encourage improved lifestyle - low carb, low sugar diet, reduce portion size, continue improving regular exercise 3. Continue ASA, ACEi Due for DM eye exam - handout given with locations 5. Follow-up q 6 mo A1c      Relevant Orders   POCT HgB A1C (Completed)   Essential (primary) hypertension    Normal BP, controlled. - Home BP readings, none available still No known complications / History of CVA on ASA    Plan:  1. Continue current BP regimen - Lisinopril 5mg  daily 2. Encourage improved lifestyle - improve hydration, low sodium diet exercise 3. Follow-up 6 months      Gout    Relatively stable chronic problem with some mild flares Now resolved R ankle foot gout flare s/p indocin  Follow-up as needed PRN flare - has Indocin PRN Recheck uric acid in 6 months       Other Visit Diagnoses    Need for vaccination with 13-polyvalent pneumococcal conjugate vaccine       Relevant Orders   Pneumococcal conjugate vaccine 13-valent IM (Completed)      No orders of the defined types were placed in this encounter.   Follow up plan: Return in about 6 months (around 04/07/2019) for yearly medicare check up.  Future labs ordered for 03/2019  Saralyn Pilar, DO [facility] 10/06/2018, 3:01 PM

## 2018-10-06 NOTE — Patient Instructions (Addendum)
Thank you for coming to the office today.  Recent Labs    04/02/18 0838 10/06/18 1501  HGBA1C 7.5* 7.1*   Keep up the good work with improved sugar.   Your provider would like to you have your annual eye exam.  Schedule a "diabetic eye check up" - they can send us the copy of the report  [facility]   Address: [address], [place], [place] [address] Phone: [phone]  Website: [email or web address]   [facility] [address], [place], [place] [address] Phone: [phone] [email or web address]  [facility]  Address: [address], [place], [place] [address] Phone: [phone]   [facility] [address], [place] [address] Phone: [phone]  [facility] Address: [address], [place], [place] [address]  Phone: [phone]   Please schedule a Follow-up Appointment to: Return in about 6 months (around 04/07/2019) for yearly medicare check up.  If you have any other questions or concerns, please feel free to call the office or send a message through MyChart. You may also schedule an earlier appointment if necessary.  Additionally, you may be receiving a survey about your experience at our office within a few days to 1 week by e-mail or mail. We value your feedback.  Saralyn PilarAlexander Karamalegos, DO [facility] [place] [facility]

## 2018-10-06 NOTE — Assessment & Plan Note (Signed)
Relatively stable chronic problem with some mild flares Now resolved R ankle foot gout flare s/p indocin  Follow-up as needed PRN flare - has Indocin PRN Recheck uric acid in 6 months

## 2018-10-06 NOTE — Assessment & Plan Note (Signed)
Normal BP, controlled. - Home BP readings, none available still No known complications / History of CVA on ASA    Plan:  1. Continue current BP regimen - Lisinopril 5mg  daily 2. Encourage improved lifestyle - improve hydration, low sodium diet exercise 3. Follow-up 6 months

## 2018-10-06 NOTE — Assessment & Plan Note (Signed)
Improved Clinically stable and Asymptomatic Continue on oral iron BID as tolerated Follow-up as planned 6 months, lab

## 2018-10-06 NOTE — Assessment & Plan Note (Signed)
Well-controlled DM with A1c 7.1 at goal Complications - peripheral neuropathy  Plan:  1. Not on any medication 2. Encourage improved lifestyle - low carb, low sugar diet, reduce portion size, continue improving regular exercise 3. Continue ASA, ACEi Due for DM eye exam - handout given with locations 5. Follow-up q 6 mo A1c

## 2019-06-29 ENCOUNTER — Other Ambulatory Visit: Payer: Self-pay | Admitting: Family Medicine

## 2019-06-29 DIAGNOSIS — M109 Gout, unspecified: Secondary | ICD-10-CM

## 2019-06-29 DIAGNOSIS — M1A09X0 Idiopathic chronic gout, multiple sites, without tophus (tophi): Secondary | ICD-10-CM

## 2019-09-20 ENCOUNTER — Other Ambulatory Visit: Payer: Self-pay | Admitting: Family Medicine

## 2019-09-20 NOTE — Telephone Encounter (Signed)
Pt  Is  Requesting  Refill on gout  medication

## 2019-09-20 NOTE — Telephone Encounter (Signed)
The pt daughter was notified.  

## 2019-09-28 ENCOUNTER — Ambulatory Visit (INDEPENDENT_AMBULATORY_CARE_PROVIDER_SITE_OTHER): Payer: Medicare Other | Admitting: Family Medicine

## 2019-09-28 ENCOUNTER — Other Ambulatory Visit: Payer: Self-pay

## 2019-09-28 ENCOUNTER — Encounter: Payer: Self-pay | Admitting: Family Medicine

## 2019-09-28 VITALS — BP 148/56 | HR 56 | Temp 97.7°F | Ht 60.0 in | Wt 116.0 lb

## 2019-09-28 DIAGNOSIS — I10 Essential (primary) hypertension: Secondary | ICD-10-CM

## 2019-09-28 DIAGNOSIS — M1A09X0 Idiopathic chronic gout, multiple sites, without tophus (tophi): Secondary | ICD-10-CM

## 2019-09-28 DIAGNOSIS — R3915 Urgency of urination: Secondary | ICD-10-CM | POA: Diagnosis not present

## 2019-09-28 DIAGNOSIS — D508 Other iron deficiency anemias: Secondary | ICD-10-CM

## 2019-09-28 DIAGNOSIS — I6930 Unspecified sequelae of cerebral infarction: Secondary | ICD-10-CM | POA: Diagnosis not present

## 2019-09-28 DIAGNOSIS — Z23 Encounter for immunization: Secondary | ICD-10-CM | POA: Diagnosis not present

## 2019-09-28 DIAGNOSIS — E782 Mixed hyperlipidemia: Secondary | ICD-10-CM

## 2019-09-28 DIAGNOSIS — E1140 Type 2 diabetes mellitus with diabetic neuropathy, unspecified: Secondary | ICD-10-CM | POA: Diagnosis not present

## 2019-09-28 DIAGNOSIS — E1142 Type 2 diabetes mellitus with diabetic polyneuropathy: Secondary | ICD-10-CM | POA: Diagnosis not present

## 2019-09-28 LAB — POCT GLYCOSYLATED HEMOGLOBIN (HGB A1C): Hemoglobin A1C: 7.6 % — AB (ref 4.0–5.6)

## 2019-09-28 MED ORDER — LISINOPRIL 10 MG PO TABS: 10.0000 mg | ORAL_TABLET | Freq: Every day | ORAL | 3 refills | Status: DC

## 2019-09-28 MED ORDER — INDOMETHACIN 25 MG PO CAPS: ORAL_CAPSULE | ORAL | 2 refills | Status: DC

## 2019-09-28 NOTE — Assessment & Plan Note (Signed)
Persistent chronic problem  Reduced monofilament sensation on foot exam, has intact sensation to light touch See exam for details 

## 2019-09-28 NOTE — Progress Notes (Signed)
Subjective:    Patient ID: Gabriela Robles, female    DOB: Mar 27, 1929, 85 y.o.   MRN: [id number]  Gabriela Robles is a 85 y.o. female presenting on 09/28/2019 for Diabetes (difficulty with urination today, pt admits that she haven't had much to drink. Nothing at all this morning. . She said she felt a urgency like she have to urinate. She strained, but still unable to void. ) and Hypertension  History providedby family today, from daughter.  HPI   Urinary Urgency / Reduced Urine output Reports that onset this morning without any pain or other symptoms, unable to void initially, she has not taken in as much fluid  FOLLOW-UPCHRONIC HTN: - Today patient reportsdoing well.No BP checks outside officestill. Current Meds - Lisinopril  daily in AM  Reports good compliance, did NOT take meds today. Tolerating well, w/o complaints. - Continue ASA  for risk reduction with prior CVA  Anemia, iron deficiency/ Fatigue: See prior notes for history of anemia and lab results. Has improved on last lab check Hgb. - Today feels well. Improved overall, no reduced energy. - STOPPED taking Iron pill OTC twice daily back in Summer, now 6 months off med. - Regular exercises daily, active walks around house and outdoors ingarden, very functional -Denies any episodes of bleeding, sleepiness, syncope, chest pain, or dyspnea, poor appetite, constipation, abdominal pain  CHRONIC DM, Type 2 - Diet Controlled/ DM Neuropathy Due for lab today A1c. CBGs: No longer checking CBG. Meds: None Currently on ACEi Diet isbalanced, some sweets still - appetite is good overall. Denies hypoglycemia  PMH - ResidualRupper extremity weakness, from CVA Reports chronic weakness of Rgrip and upper extremity, without any recent changes  Follow-up Gout, R Foot / Ankle Occasional flare ups. Last 2-3 month ago. Taking Indomethacin PRN. Not regularly Last uric acid in 2019 elevated   Health  Maintenance: Due for Flu Shot, will receive today    Depression screen [facility] 2/9 10/06/2018 10/06/2018 04/03/2018  Decreased Interest 0 0 0  Down, Depressed, Hopeless 0 0 0  PHQ - 2 Score 0 0 0    Past Medical History:  Diagnosis Date  . Arthritis    No past surgical history on file. Social History   Socioeconomic History  . Marital status: Widowed    Spouse name: Not on file  . Number of children: 10  . Years of education: Not on file  . Highest education level: Not on file  Occupational History  . Occupation: retired  Engineer, production  . Financial resource strain: Not hard at all  . Food insecurity    Worry: Never true    Inability: Never true  . Transportation needs    Medical: No    Non-medical: No  Tobacco Use  . Smoking status: Never Smoker  . Smokeless tobacco: Never Used  Substance and Sexual Activity  . Alcohol use: No    Alcohol/week: 0.0 standard drinks  . Drug use: No  . Sexual activity: Not on file  Lifestyle  . Physical activity    Days per week: 0 days    Minutes per session: 0 min  . Stress: Not at all  Relationships  . Social connections    Talks on phone: More than three times a week    Gets together: More than three times a week    Attends religious service: More than 4 times per year    Active member of club or organization: No    Attends meetings of clubs or  organizations: Never    Relationship status: Widowed  . Intimate partner violence    Fear of current or ex partner: No    Emotionally abused: No    Physically abused: No    Forced sexual activity: No  Other Topics Concern  . Not on file  Social History Narrative   5 children living and 5 children deceased.    Family History  Problem Relation Age of Onset  . Cancer Daughter        bone cancer   Current Outpatient Medications on File Prior to Visit  Medication Sig  . Omega-3 Fatty Acids (FISH OIL) 1000 MG CPDR Take by mouth.  . [facility] ASPIRIN ADULT LOW DOSE 81 MG chewable tablet Chew  81 mg by mouth daily.  . MULTIPLE VITAMINS-MINERALS ER PO Take by mouth.   No current facility-administered medications on file prior to visit.     Review of Systems  Constitutional: Negative for activity change, appetite change, chills, diaphoresis, fatigue and fever.  HENT: Negative for congestion and hearing loss.   Eyes: Negative for visual disturbance.  Respiratory: Negative for cough, chest tightness, shortness of breath and wheezing.   Cardiovascular: Negative for chest pain, palpitations and leg swelling.  Gastrointestinal: Negative for abdominal pain, constipation, diarrhea, nausea and vomiting.  Endocrine: Negative for cold intolerance.  Genitourinary: Negative for dysuria, frequency and hematuria.  Musculoskeletal: Negative for arthralgias and neck pain.  Skin: Negative for rash.  Allergic/Immunologic: Negative for environmental allergies.  Neurological: Negative for dizziness, weakness, light-headedness, numbness and headaches.  Hematological: Negative for adenopathy.  Psychiatric/Behavioral: Negative for behavioral problems, dysphoric mood and sleep disturbance.   Per HPI unless specifically indicated above      Objective:    BP (!) 148/56 (BP Location: Left Arm, Cuff Size: Normal)   Pulse (!) 56   Temp 97.7 F (36.5 C) (Oral)   Ht 5' (1.524 m)   Wt 116 lb (52.6 kg)   SpO2 98%   BMI 22.65 kg/m   Wt Readings from Last 3 Encounters:  09/28/19 116 lb (52.6 kg)  10/06/18 113 lb (51.3 kg)  10/06/18 113 lb (51.3 kg)    Physical Exam Vitals signs and nursing note reviewed.  Constitutional:      General: She is not in acute distress.    Appearance: She is well-developed. She is not diaphoretic.     Comments: Well appearing, thin appearing elderly 85 year old female  HENT:     Head: Normocephalic and atraumatic.  Eyes:     Conjunctiva/sclera: Conjunctivae normal.     Pupils: Pupils are equal, round, and reactive to light.  Neck:     Musculoskeletal: Normal  range of motion and neck supple.     Thyroid: No thyromegaly.  Cardiovascular:     Rate and Rhythm: Normal rate and regular rhythm.     Heart sounds: Normal heart sounds. No murmur.  Pulmonary:     Effort: Pulmonary effort is normal. No respiratory distress.     Breath sounds: Normal breath sounds. No wheezing or rales.  Abdominal:     General: Bowel sounds are normal. There is no distension.     Palpations: Abdomen is soft. There is no mass.     Tenderness: There is no abdominal tenderness.  Musculoskeletal: Normal range of motion.        General: No tenderness.     Comments: Has walker  Lymphadenopathy:     Cervical: No cervical adenopathy.  Skin:    General:  Skin is warm and dry.     Findings: No rash.  Neurological:     Mental Status: She is alert and oriented to person, place, and time.  Psychiatric:        Behavior: Behavior normal.      Diabetic Foot Exam - Simple   Simple Foot Form Diabetic Foot exam was performed with the following findings: Yes 09/28/2019 10:42 AM  Visual Inspection No deformities, no ulcerations, no other skin breakdown bilaterally: Yes Sensation Testing See comments: Yes Pulse Check Posterior Tibialis and Dorsalis pulse intact bilaterally: Yes Comments Significantly reduced monofilament sensation bilateral feet plantar and dorsal, chronic problem. Callus formation without ulceration.      Recent Labs    10/06/18 1501 09/28/19 1031  HGBA1C 7.1* 7.6*    Results for orders placed or performed in visit on 09/28/19  [facility] - CBC with Differential/Platelet physical  Result Value Ref Range   WBC 5.1 3.8 - 10.8 Thousand/uL   RBC 4.86 3.80 - 5.10 Million/uL   Hemoglobin 9.4 (L) 11.7 - 15.5 g/dL   HCT 16.1 (L) 09.6 - 04.5 %   MCV 66.0 (L) 80.0 - 100.0 fL   MCH 19.3 (L) 27.0 - 33.0 pg   MCHC 29.3 (L) 32.0 - 36.0 g/dL   RDW 40.9 (H) 81.1 - 91.4 %   Platelets 321 140 - 400 Thousand/uL   MPV 10.1 7.5 - 12.5 fL   Neutro Abs 2,438 1,500 - 7,800  cells/uL   Lymphs Abs 2,300 850 - 3,900 cells/uL   Absolute Monocytes 281 200 - 950 cells/uL   Eosinophils Absolute 61 15 - 500 cells/uL   Basophils Absolute 20 0 - 200 cells/uL   Neutrophils Relative % 47.8 %   Total Lymphocyte 45.1 %   Monocytes Relative 5.5 %   Eosinophils Relative 1.2 %   Basophils Relative 0.4 %  [facility] - CMET w/ GFR CMP Complete Metabolic Panel physical  Result Value Ref Range   Glucose, Bld 117 (H) 65 - 99 mg/dL   BUN 30 (H) 7 - 25 mg/dL   Creat 7.82 (H) 9.56 - 0.88 mg/dL   GFR, Est Non African American 56 (L) > OR = 60 mL/min/1.72m2   GFR, Est African American 64 > OR = 60 mL/min/1.62m2   BUN/Creatinine Ratio 33 (H) 6 - 22 (calc)   Sodium 141 135 - 146 mmol/L   Potassium 4.4 3.5 - 5.3 mmol/L   Chloride 105 98 - 110 mmol/L   CO2 26 20 - 32 mmol/L   Calcium 9.2 8.6 - 10.4 mg/dL   Total Protein 7.3 6.1 - 8.1 g/dL   Albumin 3.9 3.6 - 5.1 g/dL   Globulin 3.4 1.9 - 3.7 g/dL (calc)   AG Ratio 1.1 1.0 - 2.5 (calc)   Total Bilirubin 0.4 0.2 - 1.2 mg/dL   Alkaline phosphatase (APISO) 89 37 - 153 U/L   AST 13 10 - 35 U/L   ALT 10 6 - 29 U/L  [facility] - Lipid panel physical  Result Value Ref Range   Cholesterol 178 <200 mg/dL   HDL 44 (L) > OR = 50 mg/dL   Triglycerides 213 (H) <150 mg/dL   LDL Cholesterol (Calc) 105 (H) mg/dL (calc)   Total CHOL/HDL Ratio 4.0 <5.0 (calc)   Non-HDL Cholesterol (Calc) 134 (H) <130 mg/dL (calc)  Uric acid  Result Value Ref Range   Uric Acid, Serum 10.0 (H) 2.5 - 7.0 mg/dL  CBC MORPHOLOGY  Result Value Ref Range  CBC MORPHOLOGY  NORMAL  POCT HgB A1C  Result Value Ref Range   Hemoglobin A1C 7.6 (A) 4.0 - 5.6 %      Assessment & Plan:   Problem List Items Addressed This Visit    HLD (hyperlipidemia)    Due for fasting lipid today Controlled cholesterol off statin and some limited lifestyle Last lipid panel 02/2018 Calculated ASCVD 10 yr risk score elevated in DM and age  Plan: 1. Continue ASA 81mg  for secondary ASCVD risk  reduction 2. Encourage improved lifestyle - low carb/cholesterol, reduce portion size, continue improving  3. Follow-up yearly lipid      Relevant Medications   lisinopril (ZESTRIL) 10 MG tablet   Other Relevant Orders   [facility] - Lipid panel physical (Completed)   History of cerebrovascular accident (CVA) with residual deficit    Stable without recurrence of further deficits On ASA 81 Chronic weakness RUE primarily grip hand      RESOLVED: Gout   Relevant Medications   indomethacin (INDOCIN) 25 MG capsule   Other Relevant Orders   Uric acid   Essential (primary) hypertension    Previously controlled. Off med now, elevated BP mild improved on recheck - Home BP readings, none available still No known complications / History of CVA on ASA    Plan:  1. Continue current BP regimen - Lisinopril 10mg  daily - re order med 2. Encourage improved lifestyle - improve hydration, low sodium diet exercise 3. Follow-up 6 months      Relevant Medications   lisinopril (ZESTRIL) 10 MG tablet   Diabetic peripheral neuropathy (HCC)    Persistent chronic problem Reduced monofilament sensation on foot exam, has intact sensation to light touch See exam for details      Relevant Medications   lisinopril (ZESTRIL) 10 MG tablet   Controlled diabetes mellitus with diabetic neuropathy (HCC) - Primary    Controlled DM with A1c 7.6 at goal < 8 Complications - peripheral neuropathy  Plan:  1. Not on any medication 2. Encourage improved lifestyle - low carb, low sugar diet, reduce portion size, continue improving regular exercise 3. Continue ASA, ACEi 5. Follow-up q 6 mo A1c      Relevant Medications   lisinopril (ZESTRIL) 10 MG tablet   Other Relevant Orders   POCT HgB A1C (Completed)   [facility] - CMET w/ GFR CMP Complete Metabolic Panel physical (Completed)   Chronic gout of multiple sites    Chronic problem recurrent flares foot/ankle usually Last uric acid elevated >7  Repeat lab uric acid  today Refill Indomethacin takes very rarely PRN - counseling on limiting use.      Relevant Medications   indomethacin (INDOCIN) 25 MG capsule   Other Relevant Orders   Uric acid   Anemia    Clinically remains improved Asymptomatic Previously resolved Now off iron supplement BID for past 6 months Recheck labs      Relevant Orders   [facility] - CBC with Differential/Platelet physical (Completed)    Other Visit Diagnoses    Urinary urgency       Needs flu shot       Relevant Orders   [facility] High Dose 2020+ Flu Vaccine QUAD High Dose(Fluad) (Completed)      Meds ordered this encounter  Medications  . lisinopril (ZESTRIL) 10 MG tablet    Sig: Take 1 tablet (10 mg total) by mouth daily.    Dispense:  90 tablet    Refill:  3  . indomethacin (INDOCIN) 25  MG capsule    Sig: TAKE 1 CAPSULE BY MOUTH 3 (THREE) TIMES DAILY WITH MEALS. TAKE AS NEEDED FOR GOUT FLAIR UP TO 1 WEEK    Dispense:  30 capsule    Refill:  2    DX Code Needed  M10.9     Follow up plan: Return in about 6 months (around 03/28/2020) for 6 month follow-up DM A1c, HTN, Anemia.  Saralyn Pilar, DO [facility] 09/28/2019, 10:23 AM

## 2019-09-28 NOTE — Assessment & Plan Note (Signed)
Chronic problem recurrent flares foot/ankle usually Last uric acid elevated >7  Repeat lab uric acid today Refill Indomethacin takes very rarely PRN - counseling on limiting use.

## 2019-09-28 NOTE — Assessment & Plan Note (Signed)
Due for fasting lipid today Controlled cholesterol off statin and some limited lifestyle Last lipid panel 02/2018 Calculated ASCVD 10 yr risk score elevated in DM and age  Plan: 1. Continue ASA 81mg  for secondary ASCVD risk reduction 2. Encourage improved lifestyle - low carb/cholesterol, reduce portion size, continue improving  3. Follow-up yearly lipid

## 2019-09-28 NOTE — Assessment & Plan Note (Signed)
Stable without recurrence of further deficits On ASA 81 Chronic weakness RUE primarily grip hand

## 2019-09-28 NOTE — Assessment & Plan Note (Signed)
Clinically remains improved Asymptomatic Previously resolved Now off iron supplement BID for past 6 months Recheck labs

## 2019-09-28 NOTE — Assessment & Plan Note (Signed)
Controlled DM with A1c 7.6 at goal < 8 Complications - peripheral neuropathy  Plan:  1. Not on any medication 2. Encourage improved lifestyle - low carb, low sugar diet, reduce portion size, continue improving regular exercise 3. Continue ASA, ACEi 5. Follow-up q 6 mo A1c

## 2019-09-28 NOTE — Assessment & Plan Note (Signed)
Previously controlled. Off med now, elevated BP mild improved on recheck - Home BP readings, none available still No known complications / History of CVA on ASA    Plan:  1. Continue current BP regimen - Lisinopril 10mg  daily - re order med 2. Encourage improved lifestyle - improve hydration, low sodium diet exercise 3. Follow-up 6 months

## 2019-09-28 NOTE — Patient Instructions (Addendum)
Thank you for coming to the office today.  Labs today  Flu shot today  If urinary symptoms continue - call us back this week, we can offer a virtual visit to talk about urine doesn't seem like an infection but it is possible. Since we cannot get urine test today  Refilled meds - ONLY take Gout medicine IF FLARE, otherwise do not take.   Please schedule a Follow-up Appointment to: Return in about 6 months (around 03/28/2020) for 6 month follow-up DM A1c, HTN, Anemia.  If you have any other questions or concerns, please feel free to call the office or send a message through [place]. You may also schedule an earlier appointment if necessary.  Additionally, you may be receiving a survey about your experience at our office within a few days to 1 week by e-mail or mail. We value your feedback.  Nobie Putnam, DO [place]

## 2019-09-29 DIAGNOSIS — Z23 Encounter for immunization: Secondary | ICD-10-CM

## 2019-09-29 DIAGNOSIS — E782 Mixed hyperlipidemia: Secondary | ICD-10-CM | POA: Diagnosis not present

## 2019-09-29 DIAGNOSIS — R3915 Urgency of urination: Secondary | ICD-10-CM | POA: Diagnosis not present

## 2019-09-29 DIAGNOSIS — E1140 Type 2 diabetes mellitus with diabetic neuropathy, unspecified: Secondary | ICD-10-CM | POA: Diagnosis not present

## 2019-09-29 DIAGNOSIS — E1142 Type 2 diabetes mellitus with diabetic polyneuropathy: Secondary | ICD-10-CM | POA: Diagnosis not present

## 2019-09-29 DIAGNOSIS — D508 Other iron deficiency anemias: Secondary | ICD-10-CM | POA: Diagnosis not present

## 2019-09-29 DIAGNOSIS — I10 Essential (primary) hypertension: Secondary | ICD-10-CM | POA: Diagnosis not present

## 2019-09-29 DIAGNOSIS — I6930 Unspecified sequelae of cerebral infarction: Secondary | ICD-10-CM | POA: Diagnosis not present

## 2019-09-29 DIAGNOSIS — M1A09X0 Idiopathic chronic gout, multiple sites, without tophus (tophi): Secondary | ICD-10-CM | POA: Diagnosis not present

## 2019-09-29 LAB — COMPLETE METABOLIC PANEL WITH GFR
AG Ratio: 1.1 (calc) (ref 1.0–2.5)
ALT: 10 U/L (ref 6–29)
AST: 13 U/L (ref 10–35)
Albumin: 3.9 g/dL (ref 3.6–5.1)
Alkaline phosphatase (APISO): 89 U/L (ref 37–153)
BUN/Creatinine Ratio: 33 (calc) — ABNORMAL HIGH (ref 6–22)
BUN: 30 mg/dL — ABNORMAL HIGH (ref 7–25)
CO2: 26 mmol/L (ref 20–32)
Calcium: 9.2 mg/dL (ref 8.6–10.4)
Chloride: 105 mmol/L (ref 98–110)
Creat: 0.92 mg/dL — ABNORMAL HIGH (ref 0.60–0.88)
GFR, Est African American: 64 mL/min/{1.73_m2} (ref >=60)
GFR, Est Non African American: 56 mL/min/{1.73_m2} — ABNORMAL LOW (ref >=60)
Globulin: 3.4 g/dL (calc) (ref 1.9–3.7)
Glucose, Bld: 117 mg/dL — ABNORMAL HIGH (ref 65–99)
Potassium: 4.4 mmol/L (ref 3.5–5.3)
Sodium: 141 mmol/L (ref 135–146)
Total Bilirubin: 0.4 mg/dL (ref 0.2–1.2)
Total Protein: 7.3 g/dL (ref 6.1–8.1)

## 2019-09-29 LAB — CBC WITH DIFFERENTIAL/PLATELET
Absolute Monocytes: 281 cells/uL (ref 200–950)
Basophils Absolute: 20 cells/uL (ref 0–200)
Basophils Relative: 0.4 %
Eosinophils Absolute: 61 cells/uL (ref 15–500)
Eosinophils Relative: 1.2 %
HCT: 32.1 % — ABNORMAL LOW (ref 35.0–45.0)
Hemoglobin: 9.4 g/dL — ABNORMAL LOW (ref 11.7–15.5)
Lymphs Abs: 2300 cells/uL (ref 850–3900)
MCH: 19.3 pg — ABNORMAL LOW (ref 27.0–33.0)
MCHC: 29.3 g/dL — ABNORMAL LOW (ref 32.0–36.0)
MCV: 66 fL — ABNORMAL LOW (ref 80.0–100.0)
MPV: 10.1 fL (ref 7.5–12.5)
Monocytes Relative: 5.5 %
Neutro Abs: 2438 cells/uL (ref 1500–7800)
Neutrophils Relative %: 47.8 %
Platelets: 321 10*3/uL (ref 140–400)
RBC: 4.86 10*6/uL (ref 3.80–5.10)
RDW: 17.4 % — ABNORMAL HIGH (ref 11.0–15.0)
Total Lymphocyte: 45.1 %
WBC: 5.1 10*3/uL (ref 3.8–10.8)

## 2019-09-29 LAB — LIPID PANEL
Cholesterol: 178 mg/dL (ref <200)
HDL: 44 mg/dL — ABNORMAL LOW (ref >=50)
LDL Cholesterol (Calc): 105 mg/dL (calc) — ABNORMAL HIGH
Non-HDL Cholesterol (Calc): 134 mg/dL (calc) — ABNORMAL HIGH (ref <130)
Total CHOL/HDL Ratio: 4 (calc) (ref <5.0)
Triglycerides: 172 mg/dL — ABNORMAL HIGH (ref <150)

## 2019-09-29 LAB — URIC ACID: Uric Acid, Serum: 10 mg/dL — ABNORMAL HIGH (ref 2.5–7.0)

## 2019-09-29 LAB — CBC MORPHOLOGY

## 2019-11-02 ENCOUNTER — Ambulatory Visit (INDEPENDENT_AMBULATORY_CARE_PROVIDER_SITE_OTHER): Payer: Medicare Other

## 2019-11-02 ENCOUNTER — Other Ambulatory Visit: Payer: Self-pay

## 2019-11-02 VITALS — BP 166/54 | HR 63 | Ht 60.0 in | Wt 121.8 lb

## 2019-11-02 DIAGNOSIS — Z0000 Encounter for general adult medical examination without abnormal findings: Secondary | ICD-10-CM | POA: Diagnosis not present

## 2019-11-02 DIAGNOSIS — Z23 Encounter for immunization: Secondary | ICD-10-CM | POA: Diagnosis not present

## 2019-11-02 NOTE — Patient Instructions (Signed)
Gabriela Robles , Thank you for taking time to come for your Medicare Wellness Visit. I appreciate your ongoing commitment to your health goals. Please review the following plan we discussed and let me know if I can assist you in the future.   Screening recommendations/referrals: Colonoscopy: no longer required Mammogram: no longer required Bone Density: declined  Recommended yearly ophthalmology/optometry visit for glaucoma screening and checkup Recommended yearly dental visit for hygiene and checkup  Vaccinations: Influenza vaccine: up to date Pneumococcal vaccine: completed series today.  Tdap vaccine: due now, please call if you need this in the future.  Shingles vaccine: shingrix eligible     Advanced directives: Advance directive discussed with you today. I have provided a copy for you to complete at home and have notarized. Once this is complete please bring a copy in to our office so we can scan it into your chart.  Conditions/risks identified: diabetic, please schedule diabetic eye exam.  Your provider would like to you have your annual eye exam. Please contact your current eye doctor or here are some good options for you to contact.   [facility]         Address: [address] [place], [place] [address]    Phone: [phone]       Website: [email or web address] Address: [address], [place], [place] [address]  Phone: [phone]  Website: [email or web address]  [facility]  Address: [address] [place], [place], [place] [address] Phone: [phone]   [facility]  Address: [address] [place], [place], [place] [address]  Phone: [phone]   [facility] Address: [address] [place], [place], [place] [address]  Phone: [phone]   Next appointment: Follow up in one year for your annual wellness visit    Preventive Care 65 Years and Older, Female Preventive care refers to lifestyle choices and visits with your  health care provider that can promote health and wellness. What does preventive care include?  A yearly physical exam. This is also called an annual well check.  Dental exams once or twice a year.  Routine eye exams. Ask your health care provider how often you should have your eyes checked.  Personal lifestyle choices, including:  Daily care of your teeth and gums.  Regular physical activity.  Eating a healthy diet.  Avoiding tobacco and drug use.  Limiting alcohol use.  Practicing safe sex.  Taking low-dose aspirin every day.  Taking vitamin and mineral supplements as recommended by your health care provider. What happens during an annual well check? The services and screenings done by your health care provider during your annual well check will depend on your age, overall health, lifestyle risk factors, and family history of disease. Counseling  Your health care provider may ask you questions about your:  Alcohol use.  Tobacco use.  Drug use.  Emotional well-being.  Home and relationship well-being.  Sexual activity.  Eating habits.  History of falls.  Memory and ability to understand (cognition).  Work and work Astronomer.  Reproductive health. Screening  You may have the following tests or measurements:  Height, weight, and BMI.  Blood pressure.  Lipid and cholesterol levels. These may be checked every 5 years, or more frequently if you are over 59 years old.  Skin check.  Lung cancer screening. You may have this screening every year starting at age 54 if you have a 30-pack-year history of smoking and currently  smoke or have quit within the past 15 years.  Fecal occult blood test (FOBT) of the stool. You may have this test every year starting at age 21.  Flexible sigmoidoscopy or colonoscopy. You may have a sigmoidoscopy every 5 years or a colonoscopy every 10 years starting at age 31.  Hepatitis C blood test.  Hepatitis B blood  test.  Sexually transmitted disease (STD) testing.  Diabetes screening. This is done by checking your blood sugar (glucose) after you have not eaten for a while (fasting). You may have this done every 1-3 years.  Bone density scan. This is done to screen for osteoporosis. You may have this done starting at age 21.  Mammogram. This may be done every 1-2 years. Talk to your health care provider about how often you should have regular mammograms. Talk with your health care provider about your test results, treatment options, and if necessary, the need for more tests. Vaccines  Your health care provider may recommend certain vaccines, such as:  Influenza vaccine. This is recommended every year.  Tetanus, diphtheria, and acellular pertussis (Tdap, Td) vaccine. You may need a Td booster every 10 years.  Zoster vaccine. You may need this after age 35.  Pneumococcal 13-valent conjugate (PCV13) vaccine. One dose is recommended after age 32.  Pneumococcal polysaccharide (PPSV23) vaccine. One dose is recommended after age 62. Talk to your health care provider about which screenings and vaccines you need and how often you need them. This information is not intended to replace advice given to you by your health care provider. Make sure you discuss any questions you have with your health care provider. Document Released: 11/10/2015 Document Revised: 07/03/2016 Document Reviewed: 08/15/2015 Elsevier Interactive Patient Education  2017 [place] Prevention in the Home Falls can cause injuries. They can happen to people of all ages. There are many things you can do to make your home safe and to help prevent falls. What can I do on the outside of my home?  Regularly fix the edges of walkways and driveways and fix any cracks.  Remove anything that might make you trip as you walk through a door, such as a raised step or threshold.  Trim any bushes or trees on the path to your home.  Use  bright outdoor lighting.  Clear any walking paths of anything that might make someone trip, such as rocks or tools.  Regularly check to see if handrails are loose or broken. Make sure that both sides of any steps have handrails.  Any raised decks and porches should have guardrails on the edges.  Have any leaves, snow, or ice cleared regularly.  Use sand or salt on walking paths during winter.  Clean up any spills in your garage right away. This includes oil or grease spills. What can I do in the bathroom?  Use night lights.  Install grab bars by the toilet and in the tub and shower. Do not use towel bars as grab bars.  Use non-skid mats or decals in the tub or shower.  If you need to sit down in the shower, use a plastic, non-slip stool.  Keep the floor dry. Clean up any water that spills on the floor as soon as it happens.  Remove soap buildup in the tub or shower regularly.  Attach bath mats securely with double-sided non-slip rug tape.  Do not have throw rugs and other things on the floor that can make you trip. What can I do in  the bedroom?  Use night lights.  Make sure that you have a light by your bed that is easy to reach.  Do not use any sheets or blankets that are too big for your bed. They should not hang down onto the floor.  Have a firm chair that has side arms. You can use this for support while you get dressed.  Do not have throw rugs and other things on the floor that can make you trip. What can I do in the kitchen?  Clean up any spills right away.  Avoid walking on wet floors.  Keep items that you use a lot in easy-to-reach places.  If you need to reach something above you, use a strong step stool that has a grab bar.  Keep electrical cords out of the way.  Do not use floor polish or wax that makes floors slippery. If you must use wax, use non-skid floor wax.  Do not have throw rugs and other things on the floor that can make you trip. What can  I do with my stairs?  Do not leave any items on the stairs.  Make sure that there are handrails on both sides of the stairs and use them. Fix handrails that are broken or loose. Make sure that handrails are as long as the stairways.  Check any carpeting to make sure that it is firmly attached to the stairs. Fix any carpet that is loose or worn.  Avoid having throw rugs at the top or bottom of the stairs. If you do have throw rugs, attach them to the floor with carpet tape.  Make sure that you have a light switch at the top of the stairs and the bottom of the stairs. If you do not have them, ask someone to add them for you. What else can I do to help prevent falls?  Wear shoes that:  Do not have high heels.  Have rubber bottoms.  Are comfortable and fit you well.  Are closed at the toe. Do not wear sandals.  If you use a stepladder:  Make sure that it is fully opened. Do not climb a closed stepladder.  Make sure that both sides of the stepladder are locked into place.  Ask someone to hold it for you, if possible.  Clearly mark and make sure that you can see:  Any grab bars or handrails.  First and last steps.  Where the edge of each step is.  Use tools that help you move around (mobility aids) if they are needed. These include:  Canes.  Walkers.  Scooters.  Crutches.  Turn on the lights when you go into a dark area. Replace any light bulbs as soon as they burn out.  Set up your furniture so you have a clear path. Avoid moving your furniture around.  If any of your floors are uneven, fix them.  If there are any pets around you, be aware of where they are.  Review your medicines with your doctor. Some medicines can make you feel dizzy. This can increase your chance of falling. Ask your doctor what other things that you can do to help prevent falls. This information is not intended to replace advice given to you by your health care provider. Make sure you  discuss any questions you have with your health care provider. Document Released: 08/10/2009 Document Revised: 03/21/2016 Document Reviewed: 11/18/2014 Elsevier Interactive Patient Education  2017 [employer].

## 2019-11-02 NOTE — Progress Notes (Signed)
Subjective:   Gabriela Robles is a 86 y.o. female who presents for Medicare Annual (Subsequent) preventive examination.  Daughter helped with interpretation and medical history and questions.  Review of Systems:   Cardiac Risk Factors include: advanced age (>46men, >12 women);dyslipidemia;hypertension     Objective:     Vitals: BP (!) 166/54 (BP Location: Left Arm, Cuff Size: Normal)   Pulse 63   Ht 5' (1.524 m)   Wt 121 lb 12.8 oz (55.2 kg)   SpO2 100%   BMI 23.79 kg/m   Body mass index is 23.79 kg/m.  Advanced Directives 11/02/2019 10/06/2018 09/30/2017  Does Patient Have a Medical Advance Directive? No No No  Would patient like information on creating a medical advance directive? - No - Patient declined Yes (MAU/Ambulatory/Procedural Areas - Information given)    Tobacco Social History   Tobacco Use  Smoking Status Never Smoker  Smokeless Tobacco Never Used     Counseling given: Not Answered   Clinical Intake:  Pre-visit preparation completed: Yes  Pain : No/denies pain     Nutritional Risks: None Diabetes: No  How often do you need to have someone help you when you read instructions, pamphlets, or other written materials from your doctor or pharmacy?: 1 - Never  Interpreter Needed?: Yes Patient Declined Interpreter : Yes Interpretation services provided by: Family member interpreted visit Patient signed [place] waiver: Yes  Information entered by :: Verlon Carcione,LPN  Nutrition Risk Assessment:  Has the patient had any N/V/D within the last 2 months?  No  Does the patient have any non-healing wounds?  No  Has the patient had any unintentional weight loss or weight gain?  No   Diabetes:  Is the patient diabetic?  Yes  If diabetic, was a CBG obtained today?  No  Did the patient bring in their glucometer from home?  No  How often do you monitor your CBG's? Doesn't check at home .   Financial Strains and Diabetes Management:  Are you  having any financial strains with the device, your supplies or your medication? No .  Does the patient want to be seen by Chronic Care Management for management of their diabetes?  No  Would the patient like to be referred to a Nutritionist or for Diabetic Management?  No   Diabetic Exams:  Diabetic Eye Exam: overdue, patient does not have eye doctor. Will give recommendations on local optometrist   Diabetic Foot Exam: Completed 09/28/2019  Past Medical History:  Diagnosis Date  . Arthritis    History reviewed. No pertinent surgical history. Family History  Problem Relation Age of Onset  . Cancer Daughter        bone cancer   Social History   Socioeconomic History  . Marital status: Widowed    Spouse name: Not on file  . Number of children: 10  . Years of education: Not on file  . Highest education level: Not on file  Occupational History  . Occupation: retired  Tobacco Use  . Smoking status: Never Smoker  . Smokeless tobacco: Never Used  Substance and Sexual Activity  . Alcohol use: No    Alcohol/week: 0.0 standard drinks  . Drug use: No  . Sexual activity: Not on file  Other Topics Concern  . Not on file  Social History Narrative   5 children living and 5 children deceased.    Social Determinants of Health   Financial Resource Strain:   . Difficulty of Paying Living Expenses:  Not on file  Food Insecurity:   . Worried About Programme researcher, broadcasting/film/video in the Last Year: Not on file  . Ran Out of Food in the Last Year: Not on file  Transportation Needs:   . Lack of Transportation (Medical): Not on file  . Lack of Transportation (Non-Medical): Not on file  Physical Activity:   . Days of Exercise per Week: Not on file  . Minutes of Exercise per Session: Not on file  Stress:   . Feeling of Stress : Not on file  Social Connections:   . Frequency of Communication with Friends and Family: Not on file  . Frequency of Social Gatherings with Friends and Family: Not on file    . Attends Religious Services: Not on file  . Active Member of Clubs or Organizations: Not on file  . Attends Banker Meetings: Not on file  . Marital Status: Not on file    Outpatient Encounter Medications as of 11/02/2019  Medication Sig  . indomethacin (INDOCIN) 25 MG capsule TAKE 1 CAPSULE BY MOUTH 3 (THREE) TIMES DAILY WITH MEALS. TAKE AS NEEDED FOR GOUT FLAIR UP TO 1 WEEK  . lisinopril (ZESTRIL) 10 MG tablet Take 1 tablet (10 mg total) by mouth daily.  . Omega-3 Fatty Acids (FISH OIL) 1000 MG CPDR Take by mouth.  . [employer] ASPIRIN ADULT LOW DOSE 81 MG chewable tablet Chew 81 mg by mouth daily.  . MULTIPLE VITAMINS-MINERALS ER PO Take by mouth.   No facility-administered encounter medications on file as of 11/02/2019.    Activities of Daily Living In your present state of health, do you have any difficulty performing the following activities: 11/02/2019  Hearing? Y  Comment no hearing aids  Vision? N  Difficulty concentrating or making decisions? N  Walking or climbing stairs? N  Dressing or bathing? N  Doing errands, shopping? Y  Comment daughter drives  Quarry manager and eating ? N  Using the Toilet? N  In the past six months, have you accidently leaked urine? N  Do you have problems with loss of bowel control? N  Managing your Medications? Y  Comment son helps  Managing your Finances? Y  Comment son helps  Housekeeping or managing your Housekeeping? Y  Comment son helps  Some recent data might be hidden    Patient Care Team: Smitty Cords, DO as PCP - General (Family Medicine)    Assessment:   This is a routine wellness examination for Gabriela Robles.  Exercise Activities and Dietary recommendations Current Exercise Habits: The patient does not participate in regular exercise at present, Exercise limited by: None identified  Goals    . DIET - INCREASE WATER INTAKE     Recommend drinking at least 6-8 glasses of water a day        Fall Risk: Fall  Risk  11/02/2019 10/06/2018 10/06/2018 04/03/2018 09/30/2017  Falls in the past year? 0 0 0 No No  Number falls in past yr: 0 - - - -  Injury with Fall? 0 - - - -  Follow up - - Falls evaluation completed - -    FALL RISK PREVENTION PERTAINING TO THE HOME:  Any stairs in or around the home? No  If so, are there any without handrails? Yes   Home free of loose throw rugs in walkways, pet beds, electrical cords, etc? Yes  Adequate lighting in your home to reduce risk of falls? Yes   ASSISTIVE DEVICES UTILIZED TO PREVENT FALLS:  Life alert? No  Use of a cane, walker or w/c? Yes  Grab bars in the bathroom? No  Shower chair or bench in shower? No  Elevated toilet seat or a handicapped toilet? No   DME ORDERS:  DME order needed?  No   TIMED UP AND GO:  Was the test performed? Yes .  Length of time to ambulate 10 feet: 12 sec.   GAIT:  Appearance of gait: Gait slow and steady with use of cane Education: Fall risk prevention has been discussed.  Intervention(s) required? No   DME/home health order needed?  No    Depression Screen PHQ 2/9 Scores 11/02/2019 10/06/2018 10/06/2018 04/03/2018  PHQ - 2 Score 0 0 0 0     Cognitive Function: unable to perform due to language barrier         Immunization History  Administered Date(s) Administered  . Fluad Quad(high Dose 65+) 09/29/2019  . Influenza, High Dose Seasonal PF 07/24/2015, 08/18/2018  . Influenza-Unspecified 08/22/2017  . Pneumococcal Conjugate-13 10/06/2018  . Pneumococcal Polysaccharide-23 11/02/2019    Qualifies for Shingles Vaccine? Yes  Zostavax completed n/a. Due for Shingrix. Education has been provided regarding the importance of this vaccine. Pt has been advised to call insurance company to determine out of pocket expense. Advised may also receive vaccine at local pharmacy or Health Dept. Verbalized acceptance and understanding.  Tdap: Although this vaccine is not a covered service during a Wellness Exam, does the  patient still wish to receive this vaccine today?  No .  Education has been provided regarding the importance of this vaccine. Advised may receive this vaccine at local pharmacy or Health Dept. Aware to provide a copy of the vaccination record if obtained from local pharmacy or Health Dept. Verbalized acceptance and understanding.  Flu Vaccine: up to date   Pneumococcal Vaccine: Due for Pneumococcal vaccine. Does the patient want to receive this vaccine today? yes  Screening Tests Health Maintenance  Topic Date Due  . OPHTHALMOLOGY EXAM  07/07/1939  . DEXA SCAN  11/01/2020 (Originally 07/07/1996)  . TETANUS/TDAP  11/01/2020 (Originally [birth year]1950)  . HEMOGLOBIN A1C  03/28/2020  . FOOT EXAM  09/27/2020  . INFLUENZA VACCINE  Completed  . PNA vac Low Risk Adult  Completed    Cancer Screenings:  Colorectal Screening: no longer required   Mammogram: no longer required  Bone Density: declined   Lung Cancer Screening: (Low Dose CT Chest recommended if Age 41-80 years, 30 pack-year currently smoking OR have quit w/in 15years.) does not qualify.     Additional Screening:  Hepatitis C Screening: does not qualify  Vision Screening: Recommended annual ophthalmology exams for early detection of glaucoma and other disorders of the eye. Is the patient up to date with their annual eye exam?  No  Informed patient of some in area.   Dental Screening: Recommended annual dental exams for proper oral hygiene  Community Resource Referral:  CRR required this visit?  No       Plan:  I have personally reviewed and addressed the Medicare Annual Wellness questionnaire and have noted the following in the patient's chart:  A. Medical and social history B. Use of alcohol, tobacco or illicit drugs  C. Current medications and supplements D. Functional ability and status E.  Nutritional status F.  Physical activity G. Advance directives H. List of other physicians I.  Hospitalizations,  surgeries, and ER visits in previous 12 months J.  Vitals K. Screenings such as hearing and vision if  needed, cognitive and depression L. Referrals and appointments   In addition, I have reviewed and discussed with patient certain preventive protocols, quality metrics, and best practice recommendations. A written personalized care plan for preventive services as well as general preventive health recommendations were provided to patient.  Signed,    Collene Schlichter, LPN  06/01/1659 Nurse Health Advisor   Nurse Notes: none

## 2020-01-05 ENCOUNTER — Emergency Department: Payer: Medicare Other

## 2020-01-05 ENCOUNTER — Other Ambulatory Visit: Payer: Self-pay

## 2020-01-05 ENCOUNTER — Emergency Department
Admission: EM | Admit: 2020-01-05 | Discharge: 2020-01-05 | Disposition: A | Discharge status: Home / Self Care | Payer: Medicare Other | Attending: Emergency Medicine | Admitting: Emergency Medicine

## 2020-01-05 DIAGNOSIS — Z79899 Other long term (current) drug therapy: Secondary | ICD-10-CM | POA: Insufficient documentation

## 2020-01-05 DIAGNOSIS — Z7982 Long term (current) use of aspirin: Secondary | ICD-10-CM | POA: Insufficient documentation

## 2020-01-05 DIAGNOSIS — R945 Abnormal results of liver function studies: Secondary | ICD-10-CM | POA: Diagnosis not present

## 2020-01-05 DIAGNOSIS — R253 Fasciculation: Secondary | ICD-10-CM | POA: Diagnosis not present

## 2020-01-05 DIAGNOSIS — R52 Pain, unspecified: Secondary | ICD-10-CM

## 2020-01-05 DIAGNOSIS — I10 Essential (primary) hypertension: Secondary | ICD-10-CM | POA: Insufficient documentation

## 2020-01-05 DIAGNOSIS — I639 Cerebral infarction, unspecified: Secondary | ICD-10-CM | POA: Diagnosis not present

## 2020-01-05 DIAGNOSIS — K7689 Other specified diseases of liver: Secondary | ICD-10-CM | POA: Diagnosis not present

## 2020-01-05 DIAGNOSIS — Z8673 Personal history of transient ischemic attack (TIA), and cerebral infarction without residual deficits: Secondary | ICD-10-CM | POA: Diagnosis not present

## 2020-01-05 DIAGNOSIS — E1140 Type 2 diabetes mellitus with diabetic neuropathy, unspecified: Secondary | ICD-10-CM | POA: Diagnosis not present

## 2020-01-05 DIAGNOSIS — M79604 Pain in right leg: Secondary | ICD-10-CM | POA: Insufficient documentation

## 2020-01-05 LAB — COMPREHENSIVE METABOLIC PANEL
ALT: 80 U/L — ABNORMAL HIGH (ref 0–44)
AST: 32 U/L (ref 15–41)
Albumin: 3.9 g/dL (ref 3.5–5.0)
Alkaline Phosphatase: 141 U/L — ABNORMAL HIGH (ref 38–126)
Anion gap: 12 (ref 5–15)
BUN: 34 mg/dL — ABNORMAL HIGH (ref 8–23)
CO2: 24 mmol/L (ref 22–32)
Calcium: 9.3 mg/dL (ref 8.9–10.3)
Chloride: 95 mmol/L — ABNORMAL LOW (ref 98–111)
Creatinine, Ser: 1.27 mg/dL — ABNORMAL HIGH (ref 0.44–1.00)
GFR calc Af Amer: 44 mL/min — ABNORMAL LOW (ref >60)
GFR calc non Af Amer: 38 mL/min — ABNORMAL LOW (ref >60)
Glucose, Bld: 175 mg/dL — ABNORMAL HIGH (ref 70–99)
Potassium: 4.6 mmol/L (ref 3.5–5.1)
Sodium: 131 mmol/L — ABNORMAL LOW (ref 135–145)
Total Bilirubin: 0.7 mg/dL (ref 0.3–1.2)
Total Protein: 8.1 g/dL (ref 6.5–8.1)

## 2020-01-05 LAB — CBC
HCT: 34.2 % — ABNORMAL LOW (ref 36.0–46.0)
Hemoglobin: 10.2 g/dL — ABNORMAL LOW (ref 12.0–15.0)
MCH: 19.1 pg — ABNORMAL LOW (ref 26.0–34.0)
MCHC: 29.8 g/dL — ABNORMAL LOW (ref 30.0–36.0)
MCV: 64.2 fL — ABNORMAL LOW (ref 80.0–100.0)
Platelets: 428 10*3/uL — ABNORMAL HIGH (ref 150–400)
RBC: 5.33 MIL/uL — ABNORMAL HIGH (ref 3.87–5.11)
RDW: 15.6 % — ABNORMAL HIGH (ref 11.5–15.5)
WBC: 10.9 10*3/uL — ABNORMAL HIGH (ref 4.0–10.5)
nRBC: 0 % (ref 0.0–0.2)

## 2020-01-05 LAB — CK: Total CK: 52 U/L (ref 38–234)

## 2020-01-05 LAB — LIPASE, BLOOD: Lipase: 29 U/L (ref 11–51)

## 2020-01-05 MED ORDER — SODIUM CHLORIDE 0.9 % IV BOLUS
1000.0000 mL | Freq: Once | INTRAVENOUS | Status: AC
  Administered 2020-01-05: 1000 mL via INTRAVENOUS

## 2020-01-05 MED ORDER — TRAMADOL HCL 50 MG PO TABS: 50.0000 mg | ORAL_TABLET | Freq: Four times a day (QID) | ORAL | 0 refills | Status: DC | PRN

## 2020-01-05 MED ORDER — TRAMADOL HCL 50 MG PO TABS
50.0000 mg | ORAL_TABLET | Freq: Once | ORAL | Status: AC
  Administered 2020-01-05: 11:00:00 50 mg via ORAL
  Filled 2020-01-05: qty 1

## 2020-01-05 NOTE — ED Triage Notes (Addendum)
Pt presents from home via [facility] with c/o right sided leg cramps that have been ongoing for 1 week. Pt denies any recent falls or injuries. pt laotion, speaks no english. Pt currently alert.

## 2020-01-05 NOTE — ED Provider Notes (Signed)
[facility] Emergency Department Provider Note  Time seen: 7:44 AM  I have reviewed the triage vital signs and the nursing notes.   HISTORY  Chief Complaint Leg Pain   HPI Gabriela Robles is a 86 y.o. female with a past medical history of diabetes, hypertension, hyperlipidemia, anemia, presents to the emergency department for right leg discomfort.  According to EMS report per family as the patient only speaks [place], patient is complaining of intermittent right lower extremity pain which they described as "spasm-like" pain per EMS.  Currently the patient appears to be in no distress.  Patient is lying in bed comfortably, does not appear to be in any discomfort.  Unable to obtain further history at this time awaiting family arrival.   Past Medical History:  Diagnosis Date  . Arthritis     Patient Active Problem List   Diagnosis Date Noted  . Chronic gout of multiple sites 09/28/2019  . Weakness of right upper extremity 04/03/2018  . Myalgia due to statin 04/03/2018  . Diabetic peripheral neuropathy ([place]) 11/20/2015  . History of cerebrovascular accident (CVA) with residual deficit 10/31/2015  . Essential (primary) hypertension 09/14/2015  . HLD (hyperlipidemia) 09/14/2015  . Controlled diabetes mellitus with diabetic neuropathy ([place]) 09/14/2015  . History of arterial ischemic stroke 09/12/2015  . Anemia 07/24/2015  . Microcytic erythrocytes 04/17/2015  . Arthritis 04/15/2015  . Weight loss 04/15/2015    No past surgical history on file.  Prior to Admission medications   Medication Sig Start Date End Date Taking? Authorizing Provider  [facility] ASPIRIN ADULT LOW DOSE 81 MG chewable tablet Chew 81 mg by mouth daily. 09/14/15   [provider]  indomethacin (INDOCIN) 25 MG capsule TAKE 1 CAPSULE BY MOUTH 3 (THREE) TIMES DAILY WITH MEALS. TAKE AS NEEDED FOR GOUT FLAIR UP TO 1 WEEK 09/28/19   Karamalegos, Devonne Doughty, DO  lisinopril (ZESTRIL) 10 MG  tablet Take 1 tablet (10 mg total) by mouth daily. 09/28/19   Karamalegos, Devonne Doughty, DO  MULTIPLE VITAMINS-MINERALS ER PO Take by mouth.    [provider]  Omega-3 Fatty Acids (FISH OIL) 1000 MG CPDR Take by mouth.    [provider]    No Known Allergies  Family History  Problem Relation Age of Onset  . Cancer Daughter        bone cancer    Social History Social History   Tobacco Use  . Smoking status: Never Smoker  . Smokeless tobacco: Never Used  Substance Use Topics  . Alcohol use: No    Alcohol/week: 0.0 standard drinks  . Drug use: No    Review of Systems Unable to obtain an adequate/accurate review of systems at this time secondary to language barrier, iPad interpreter does not have [place] option.  Awaiting family arrival for further history.  ____________________________________________   PHYSICAL EXAM:  VITAL SIGNS: ED Triage Vitals [01/05/20 0737]  Enc Vitals Group     BP      Pulse      Resp      Temp      Temp src      SpO2      Weight 123 lb 7.3 oz (56 kg)     Height      Head Circumference      Peak Flow      Pain Score      Pain Loc      Pain Edu?      Excl. in [place]?  Constitutional: Awake and alert, no distress. Eyes: Normal exam ENT      Head: Normocephalic and atraumatic.      Mouth/Throat: Mucous membranes are moist. Cardiovascular: Normal rate, regular rhythm Respiratory: Normal respiratory effort without tachypnea nor retractions. Breath sounds are clear  Gastrointestinal: Soft and nontender. No distention.  Reaction to abdominal palpation. Musculoskeletal: Nontender with normal range of motion in all extremities. No lower extremity tenderness or edema.  2+ DP pulses bilaterally.  Warm extremities.  Great range of motion in all joints without any apparent pain elicited.  No pain to palpation of the legs. Neurologic: Patient moves all extremities very well, no gross deficits identified. Skin:  Skin is warm, dry  and intact.  Psychiatric: Patient acting appropriate for situation.  No distress or discomfort.  ____________________________________________    RADIOLOGY  Shows old CVA without acute CVA. Ultrasound is status post cholecystectomy does show dilated CBD however patient has normal bilirubin and no abdominal pain.  ____________________________________________   INITIAL IMPRESSION / ASSESSMENT AND PLAN / ED COURSE  Pertinent labs & imaging results that were available during my care of the patient were reviewed by me and considered in my medical decision making (see chart for details).   Patient presents to the emergency department for what appears to be intermittent right lower extremity pain.  Currently the patient appears well, no distress.  Awaiting family arrival for further history.  Given the intermittent nature of the discomfort we will check labs to ensure no significant electrolyte abnormalities or dehydration.  Once family arrives we can hopefully obtain more history to better understand the patient's illness and hopefully improve her symptoms, however they do not appear to be present at this time  Family has now arrived.  Patient had an episode of shaking her head.  They state this is what has been happening for the past 7 to 10 days.  She will shake her head because she is in pain.  When asked her where she is hurting she states from prior neck down her right arm down her right body leg.  Said it lasts for a minute or 2 and then goes away.  While I was in the room it happened once or twice and seem to last 1 or 2 seconds and then the patient would stop.  It is not seizure activity.  She only shakes her head because she is in pain per family.  In between episodes the patient appears completely normal calm does not appear to be in any discomfort.  We will add on a CT head as precaution given the entire right side of her body appears to be involved.  Patient's basic labs have resulted  showing likely iron deficient anemia however largely unchanged from baseline.  Patient appears to be mildly dehydrated, receiving IV fluids.  Slight LFT elevation.  Will obtain a right upper quadrant ultrasound as precaution.  Have also added on a lipase.  Is not entirely clear the source of the patient's intermittent discomfort.  She has had an additional episode while I was speaking to the family it lasts 1 or 2 seconds and it goes away.  Possibly neuropathic pain.  We will refer to neurology.  I will discharge the patient with a short course of Ultram I also instructed family to try to have her drink plenty water for the next several days to rehydrate.  Family and patient agreeable.  Gabriela Robles was evaluated in Emergency Department on 01/05/2020 for the symptoms described in  the history of present illness. She was evaluated in the context of the global COVID-19 pandemic, which necessitated consideration that the patient might be at risk for infection with the SARS-CoV-2 virus that causes COVID-19. Institutional protocols and algorithms that pertain to the evaluation of patients at risk for COVID-19 are in a state of rapid change based on information released by regulatory bodies including the CDC and federal and state organizations. These policies and algorithms were followed during the patient's care in the ED.  ____________________________________________   FINAL CLINICAL IMPRESSION(S) / ED DIAGNOSES  Right-sided pain   Minna Antis, MD 01/05/20 1007

## 2020-01-05 NOTE — ED Notes (Signed)
Patient transported to CT 

## 2020-01-12 ENCOUNTER — Encounter: Payer: Self-pay | Admitting: Family Medicine

## 2020-01-12 ENCOUNTER — Ambulatory Visit (INDEPENDENT_AMBULATORY_CARE_PROVIDER_SITE_OTHER): Payer: Medicare Other | Admitting: Family Medicine

## 2020-01-12 ENCOUNTER — Other Ambulatory Visit: Payer: Self-pay

## 2020-01-12 VITALS — BP 130/42 | HR 64 | Temp 97.5°F | Resp 16 | Ht 60.0 in | Wt 121.0 lb

## 2020-01-12 DIAGNOSIS — E860 Dehydration: Secondary | ICD-10-CM | POA: Diagnosis not present

## 2020-01-12 DIAGNOSIS — R7989 Other specified abnormal findings of blood chemistry: Secondary | ICD-10-CM

## 2020-01-12 DIAGNOSIS — N179 Acute kidney failure, unspecified: Secondary | ICD-10-CM | POA: Diagnosis not present

## 2020-01-12 DIAGNOSIS — M109 Gout, unspecified: Secondary | ICD-10-CM | POA: Diagnosis not present

## 2020-01-12 DIAGNOSIS — M1A09X0 Idiopathic chronic gout, multiple sites, without tophus (tophi): Secondary | ICD-10-CM

## 2020-01-12 MED ORDER — PREDNISONE 20 MG PO TABS: ORAL_TABLET | ORAL | 0 refills | Status: DC

## 2020-01-12 MED ORDER — TRAMADOL HCL 50 MG PO TABS: 50.0000 mg | ORAL_TABLET | Freq: Two times a day (BID) | ORAL | 0 refills | Status: DC | PRN

## 2020-01-12 NOTE — Patient Instructions (Addendum)
Thank you for coming to the office today.  For Gout  Start Prednisone taper over 8 days. For pain and swelling. STOP Indocin (indomethicin) the previous gout medicine for now. We can consider again in future. But this can affect kidneys  Continue Tramadol for pain as needed. Can do twice a day if need, and TWO at bedtime for pain.  Kidney function is reduced, likely mild dehydration.  Liver function is good but showed some chronic liver damage.  Please schedule a Follow-up Appointment to: Return if symptoms worsen or fail to improve.  If you have any other questions or concerns, please feel free to call the office or send a message through MyChart. You may also schedule an earlier appointment if necessary.  Additionally, you may be receiving a survey about your experience at our office within a few days to 1 week by e-mail or mail. We value your feedback.  Saralyn Pilar, DO [facility], [place]

## 2020-01-12 NOTE — Progress Notes (Signed)
Subjective:    Patient ID: Gabriela Robles, female    DOB: 1929-10-28, 86 y.o.   MRN: [id number]  Gabriela Robles is a 86 y.o. female presenting on 01/12/2020 for Hospitalization Follow-up (muscle spasm and Gout Right side onset 3 days swelling and painful)   HPI    ED FOLLOW-UP VISIT  Hospital/Location: [facility] Date of ED Visit: 01/05/20  Reason for Presenting to ED: RLE Pain Primary (+Secondary) Diagnosis: acute gout pain RLE, Elevated Creatinine 1.27 with AKI  FOLLOW-UP  - ED provider note and record have been reviewed - Patient presents today about 7 days after recent ED visit. Brief summary of recent course, patient had symptoms of RLE extremity pain similar to prior gout, presented to ED, testing in ED with labs showed dehydration elevated creatinine and normal CK, mild low Hgb similar to prior, normal lipase, CT head and RUQ abdominal US see below , treated with Tramadol PRN pain IVF rehydration.  Mild dehydration, had elevated ALT enzyme, RUQ [place] that showed some cirrhosis changes Admits that now pain is controlled or improved during day and pain worse at night. Taking Tramadol only 1 at night with relief. No longer taking Indocin out of med  Admits reduced water intake  Denies other joint pain or swelling or redness, fever chills, cough  Depression screen [facility] 2/9 01/12/2020 11/02/2019 10/06/2018  Decreased Interest 0 0 0  Down, Depressed, Hopeless 0 0 0  PHQ - 2 Score 0 0 0    Social History   Tobacco Use  . Smoking status: Never Smoker  . Smokeless tobacco: Never Used  Substance Use Topics  . Alcohol use: No    Alcohol/week: 0.0 standard drinks  . Drug use: No    Review of Systems Per HPI unless specifically indicated above     Objective:    BP (!) 130/42   Pulse 64   Temp (!) 97.5 F (36.4 C) (Temporal)   Resp 16   Ht 5' (1.524 m)   Wt 121 lb (54.9 kg)   SpO2 99%   BMI 23.63 kg/m   Wt Readings from Last 3 Encounters:  01/12/20 121 lb (54.9 kg)    01/05/20 123 lb 7.3 oz (56 kg)  11/02/19 121 lb 12.8 oz (55.2 kg)    Physical Exam Vitals and nursing note reviewed.  Constitutional:      General: She is not in acute distress.    Appearance: She is well-developed. She is not diaphoretic.     Comments: Well-appearing, comfortable, cooperative  HENT:     Head: Normocephalic and atraumatic.  Eyes:     General:        Right eye: No discharge.        Left eye: No discharge.     Conjunctiva/sclera: Conjunctivae normal.  Neck:     Thyroid: No thyromegaly.  Cardiovascular:     Rate and Rhythm: Normal rate and regular rhythm.     Heart sounds: Normal heart sounds. No murmur.  Pulmonary:     Effort: Pulmonary effort is normal. No respiratory distress.     Breath sounds: Normal breath sounds. No wheezing or rales.  Musculoskeletal:        General: Normal range of motion.     Cervical back: Normal range of motion and neck supple.     Comments: RLE ankle foot reduced edema now more normal, without erythema, mild tender however  Lymphadenopathy:     Cervical: No cervical adenopathy.  Skin:    General: Skin  is warm and dry.     Findings: No erythema or rash.  Neurological:     Mental Status: She is alert and oriented to person, place, and time.  Psychiatric:        Behavior: Behavior normal.     Comments: Well groomed, good eye contact, normal speech and thoughts      I have personally reviewed the radiology report from 01/05/20 RUQ [place].  US ABDOMEN LIMITED RUQPerformed 01/05/2020 Final result  Study Result CLINICAL DATA: Elevated LFTs  EXAM: ULTRASOUND ABDOMEN LIMITED RIGHT UPPER QUADRANT  COMPARISON: None.  FINDINGS: Gallbladder:  Prior cholecystectomy  Common bile duct:  Diameter: Dilated, 14 mm  Liver:  Coarsened echotexture. Suggestion of nodular contours. No focal abnormality. Portal vein is patent on color Doppler imaging with normal direction of blood flow towards the liver.  Other: Mild right  hydronephrosis noted.  IMPRESSION: Coarsened echotexture throughout the liver with subtle nodular contour suggesting cirrhosis.  Prior cholecystectomy. Common bile duct is dilated at 14 mm. This could be related to post cholecystectomy state. Recommend correlation with LFTs. This could be further evaluated with MRCP if felt clinically indicated.  Mild right hydronephrosis incidentally noted.   Electronically Signed By: Charlett Nose M.D. On: 01/05/2020 09:39    Results for orders placed or performed during the hospital encounter of 01/05/20  CBC  Result Value Ref Range   WBC 10.9 (H) 4.0 - 10.5 K/uL   RBC 5.33 (H) 3.87 - 5.11 MIL/uL   Hemoglobin 10.2 (L) 12.0 - 15.0 g/dL   HCT 97.4 (L) 16.3 - 84.5 %   MCV 64.2 (L) 80.0 - 100.0 fL   MCH 19.1 (L) 26.0 - 34.0 pg   MCHC 29.8 (L) 30.0 - 36.0 g/dL   RDW 36.4 (H) 68.0 - 32.1 %   Platelets 428 (H) 150 - 400 K/uL   nRBC 0.0 0.0 - 0.2 %  Comprehensive metabolic panel  Result Value Ref Range   Sodium 131 (L) 135 - 145 mmol/L   Potassium 4.6 3.5 - 5.1 mmol/L   Chloride 95 (L) 98 - 111 mmol/L   CO2 24 22 - 32 mmol/L   Glucose, Bld 175 (H) 70 - 99 mg/dL   BUN 34 (H) 8 - 23 mg/dL   Creatinine, Ser 2.24 (H) 0.44 - 1.00 mg/dL   Calcium 9.3 8.9 - 82.5 mg/dL   Total Protein 8.1 6.5 - 8.1 g/dL   Albumin 3.9 3.5 - 5.0 g/dL   AST 32 15 - 41 U/L   ALT 80 (H) 0 - 44 U/L   Alkaline Phosphatase 141 (H) 38 - 126 U/L   Total Bilirubin 0.7 0.3 - 1.2 mg/dL   GFR calc non Af Amer 38 (L) >60 mL/min   GFR calc Af Amer 44 (L) >60 mL/min   Anion gap 12 5 - 15  CK  Result Value Ref Range   Total CK 52 38 - 234 U/L  Lipase, blood  Result Value Ref Range   Lipase 29 11 - 51 U/L      Assessment & Plan:   Problem List Items Addressed This Visit    Chronic gout of multiple sites   Relevant Medications   traMADol (ULTRAM) 50 MG tablet   predniSONE (DELTASONE) 20 MG tablet    Other Visit Diagnoses    Acute gout of right ankle, unspecified  cause    -  Primary   Relevant Medications   traMADol (ULTRAM) 50 MG tablet   predniSONE (DELTASONE) 20  MG tablet   AKI (acute kidney injury) (HCC)       Mild dehydration       Elevated LFTs          #Dehydration / Elevated Creatinine AKI Recently due to poor volume intake water and also can be flared by NSAID Indocin has had AKi Counseling on improved rehydration, STOP NSAID Indocin, will not re order now  #Gout, acute on chronic RLE flare Now some improved START Prednisone taper over 8 days for acute gout HOLD Indocin, may reconsider in future Check PDMP Re order Tramadol new instructions 1-2 pills BID, only taking nightly can do 2 at night and OCCASIONALLY take one during day if need. Short term but safer to use for acute pain instead of NSAID  Follow-up as planned repeat labs within 3 months or sooner if indicated.  Reviewed elevated LFT and RUQ Abd [place].  Meds ordered this encounter  Medications  . traMADol (ULTRAM) 50 MG tablet    Sig: Take 1-2 tablets (50-100 mg total) by mouth 2 (two) times daily as needed.    Dispense:  20 tablet    Refill:  0  . predniSONE (DELTASONE) 20 MG tablet    Sig: Take 2 tablets (40mg ) for 3 days, then 1 tab (20mg ) for 3 days, then half tab (10mg ) for 2 days    Dispense:  10 tablet    Refill:  0      Follow up plan: Return if symptoms worsen or fail to improve.   , DO [facility] 01/12/2020, 10:38 AM

## 2020-01-13 DIAGNOSIS — E519 Thiamine deficiency, unspecified: Secondary | ICD-10-CM | POA: Diagnosis not present

## 2020-01-13 DIAGNOSIS — Z79899 Other long term (current) drug therapy: Secondary | ICD-10-CM | POA: Diagnosis not present

## 2020-01-13 DIAGNOSIS — Z131 Encounter for screening for diabetes mellitus: Secondary | ICD-10-CM | POA: Diagnosis not present

## 2020-01-13 DIAGNOSIS — E538 Deficiency of other specified B group vitamins: Secondary | ICD-10-CM | POA: Diagnosis not present

## 2020-01-13 DIAGNOSIS — E038 Other specified hypothyroidism: Secondary | ICD-10-CM | POA: Diagnosis not present

## 2020-01-13 DIAGNOSIS — E0840 Diabetes mellitus due to underlying condition with diabetic neuropathy, unspecified: Secondary | ICD-10-CM | POA: Diagnosis not present

## 2020-01-13 DIAGNOSIS — R252 Cramp and spasm: Secondary | ICD-10-CM | POA: Diagnosis not present

## 2020-01-13 DIAGNOSIS — Q998 Other specified chromosome abnormalities: Secondary | ICD-10-CM | POA: Diagnosis not present

## 2020-01-13 DIAGNOSIS — E559 Vitamin D deficiency, unspecified: Secondary | ICD-10-CM | POA: Diagnosis not present

## 2020-01-13 DIAGNOSIS — E612 Magnesium deficiency: Secondary | ICD-10-CM | POA: Diagnosis not present

## 2020-01-13 LAB — HEMOGLOBIN A1C: Hemoglobin A1C: 9.3

## 2020-01-25 ENCOUNTER — Encounter: Payer: Self-pay | Admitting: Oncology

## 2020-01-25 ENCOUNTER — Inpatient Hospital Stay: Payer: Medicare Other

## 2020-01-25 ENCOUNTER — Other Ambulatory Visit: Payer: Self-pay

## 2020-01-25 ENCOUNTER — Inpatient Hospital Stay: Payer: Medicare Other | Attending: Oncology | Admitting: Oncology

## 2020-01-25 VITALS — BP 112/77 | HR 77 | Temp 97.6°F | Resp 18 | Wt 119.6 lb

## 2020-01-25 DIAGNOSIS — R932 Abnormal findings on diagnostic imaging of liver and biliary tract: Secondary | ICD-10-CM | POA: Diagnosis not present

## 2020-01-25 DIAGNOSIS — R7401 Elevation of levels of liver transaminase levels: Secondary | ICD-10-CM | POA: Insufficient documentation

## 2020-01-25 DIAGNOSIS — I10 Essential (primary) hypertension: Secondary | ICD-10-CM

## 2020-01-25 DIAGNOSIS — D509 Iron deficiency anemia, unspecified: Secondary | ICD-10-CM

## 2020-01-25 DIAGNOSIS — R7989 Other specified abnormal findings of blood chemistry: Secondary | ICD-10-CM | POA: Diagnosis not present

## 2020-01-25 DIAGNOSIS — I6782 Cerebral ischemia: Secondary | ICD-10-CM | POA: Diagnosis not present

## 2020-01-25 DIAGNOSIS — Z8673 Personal history of transient ischemic attack (TIA), and cerebral infarction without residual deficits: Secondary | ICD-10-CM | POA: Diagnosis not present

## 2020-01-25 DIAGNOSIS — N1330 Unspecified hydronephrosis: Secondary | ICD-10-CM | POA: Diagnosis not present

## 2020-01-25 DIAGNOSIS — Z79899 Other long term (current) drug therapy: Secondary | ICD-10-CM | POA: Insufficient documentation

## 2020-01-25 DIAGNOSIS — E785 Hyperlipidemia, unspecified: Secondary | ICD-10-CM | POA: Diagnosis not present

## 2020-01-25 LAB — CBC WITH DIFFERENTIAL/PLATELET
Abs Immature Granulocytes: 0.13 10*3/uL — ABNORMAL HIGH (ref 0.00–0.07)
Basophils Absolute: 0 10*3/uL (ref 0.0–0.1)
Basophils Relative: 0 %
Eosinophils Absolute: 0.1 10*3/uL (ref 0.0–0.5)
Eosinophils Relative: 1 %
HCT: 32.5 % — ABNORMAL LOW (ref 36.0–46.0)
Hemoglobin: 9.8 g/dL — ABNORMAL LOW (ref 12.0–15.0)
Immature Granulocytes: 1 %
Lymphocytes Relative: 20 %
Lymphs Abs: 2.8 10*3/uL (ref 0.7–4.0)
MCH: 19.3 pg — ABNORMAL LOW (ref 26.0–34.0)
MCHC: 30.2 g/dL (ref 30.0–36.0)
MCV: 63.9 fL — ABNORMAL LOW (ref 80.0–100.0)
Monocytes Absolute: 0.7 10*3/uL (ref 0.1–1.0)
Monocytes Relative: 5 %
Neutro Abs: 10.3 10*3/uL — ABNORMAL HIGH (ref 1.7–7.7)
Neutrophils Relative %: 73 %
Platelets: 393 10*3/uL (ref 150–400)
RBC: 5.09 MIL/uL (ref 3.87–5.11)
RDW: 16.7 % — ABNORMAL HIGH (ref 11.5–15.5)
WBC: 14.1 10*3/uL — ABNORMAL HIGH (ref 4.0–10.5)
nRBC: 0 % (ref 0.0–0.2)

## 2020-01-25 LAB — PROTIME-INR
INR: 1 (ref 0.8–1.2)
Prothrombin Time: 13.1 seconds (ref 11.4–15.2)

## 2020-01-25 LAB — TECHNOLOGIST SMEAR REVIEW: Plt Morphology: ADEQUATE

## 2020-01-25 LAB — LACTATE DEHYDROGENASE: LDH: 122 U/L (ref 98–192)

## 2020-01-25 LAB — RETIC PANEL
Immature Retic Fract: 25.5 % — ABNORMAL HIGH (ref 2.3–15.9)
RBC.: 5.18 MIL/uL — ABNORMAL HIGH (ref 3.87–5.11)
Retic Count, Absolute: 147.1 10*3/uL (ref 19.0–186.0)
Retic Ct Pct: 2.8 % (ref 0.4–3.1)
Reticulocyte Hemoglobin: 24 pg — ABNORMAL LOW (ref >27.9)

## 2020-01-25 LAB — HEPATITIS PANEL, ACUTE
HCV Ab: NONREACTIVE
Hep A IgM: NONREACTIVE
Hep B C IgM: NONREACTIVE
Hepatitis B Surface Ag: NONREACTIVE

## 2020-01-25 LAB — IRON AND TIBC
Iron: 23 ug/dL — ABNORMAL LOW (ref 28–170)
Saturation Ratios: 9 % — ABNORMAL LOW (ref 10.4–31.8)
TIBC: 262 ug/dL (ref 250–450)
UIBC: 239 ug/dL

## 2020-01-25 LAB — APTT: aPTT: 32 seconds (ref 24–36)

## 2020-01-25 LAB — FERRITIN: Ferritin: 373 ng/mL — ABNORMAL HIGH (ref 11–307)

## 2020-01-25 LAB — GAMMA GT: GGT: 101 U/L — ABNORMAL HIGH (ref 7–50)

## 2020-01-25 NOTE — Progress Notes (Signed)
Patient is here for evaluation of elevated ferritin.  Do not offer any specific concerns today.  Interpreter services utilized.

## 2020-01-25 NOTE — Progress Notes (Signed)
Hematology/Oncology Consult note [facility] Telephone:([phone] [phone]   Patient Care Team: Olin Hauser, DO as PCP - General (Family Medicine)  REFERRING PROVIDER: Jannifer Franklin, NP  CHIEF COMPLAINTS/REASON FOR VISIT:  Evaluation of high ferritin  HISTORY OF PRESENTING ILLNESS:   Gabriela Robles is a  86 y.o.  female with PMH listed below was seen in consultation at the request of  Jannifer Franklin, NP  for evaluation of high ferritin level Patient was seen by neurology Chipper Herb for evaluation of intermittent right lower leg pain, cramps. Her work-up showed anemia, microcytic.  Ferritin was elevated in 300s.  Patient was referred to hematology for further work-up. Patient was accompanied by her daughter.  She speaks [place].  Online interpreter service was used for interpretation for the entire encounter. Patient has severe hearing loss and not able to provide history.  History was obtained mainly from daughter. Per daughter patient has been recently feeling fatigued and tired.  Appetite is good.  Patient walks independently in her house.  Able to do her ADLs. Denies any pain today.  Denies any previous alcohol use.   Review of Systems  Unable to perform ROS: Age (Severe hearing loss)  Constitutional: Positive for fatigue. Negative for appetite change and fever.  HENT:   Positive for hearing loss.   Respiratory: Negative for cough and shortness of breath.   Gastrointestinal: Negative for abdominal pain.  Genitourinary: Negative for frequency.   Psychiatric/Behavioral: Negative for confusion.    MEDICAL HISTORY:  Past Medical History:  Diagnosis Date  . Acute ischemic left MCA stroke ([place])   . Arthritis   . Diabetes mellitus without complication ([place])   . History of stroke 2017  . Hyperlipidemia   . Hypertension     SURGICAL HISTORY: History reviewed. No pertinent surgical history.  SOCIAL HISTORY: Social History    Socioeconomic History  . Marital status: Widowed    Spouse name: Not on file  . Number of children: 10  . Years of education: Not on file  . Highest education level: Not on file  Occupational History  . Occupation: retired  Tobacco Use  . Smoking status: Never Smoker  . Smokeless tobacco: Never Used  Substance and Sexual Activity  . Alcohol use: No    Alcohol/week: 0.0 standard drinks  . Drug use: No  . Sexual activity: Not Currently  Other Topics Concern  . Not on file  Social History Narrative   5 children living and 5 children deceased.    Social Determinants of Health   Financial Resource Strain:   . Difficulty of Paying Living Expenses:   Food Insecurity:   . Worried About Charity fundraiser in the Last Year:   . Arboriculturist in the Last Year:   Transportation Needs:   . Film/video editor (Medical):   Marland Kitchen Lack of Transportation (Non-Medical):   Physical Activity:   . Days of Exercise per Week:   . Minutes of Exercise per Session:   Stress:   . Feeling of Stress :   Social Connections:   . Frequency of Communication with Friends and Family:   . Frequency of Social Gatherings with Friends and Family:   . Attends Religious Services:   . Active Member of Clubs or Organizations:   . Attends Archivist Meetings:   Marland Kitchen Marital Status:   Intimate Partner Violence:   . Fear of Current or Ex-Partner:   . Emotionally Abused:   .  Physically Abused:   . Sexually Abused:     FAMILY HISTORY: Family History  Problem Relation Age of Onset  . Cancer Daughter        bone cancer    ALLERGIES:  has No Known Allergies.  MEDICATIONS:  Current Outpatient Medications  Medication Sig Dispense Refill  . [facility]S ASPIRIN ADULT LOW DOSE 81 MG chewable tablet Chew 81 mg by mouth daily.  0  . gabapentin (NEURONTIN) 100 MG capsule Take by mouth.    Marland Kitchen lisinopril (ZESTRIL) 10 MG tablet Take 1 tablet (10 mg total) by mouth daily. 90 tablet 3  . predniSONE (DELTASONE)  20 MG tablet Take 2 tablets (10m) for 3 days, then 1 tab (246m for 3 days, then half tab (103mfor 2 days 10 tablet 0  . MULTIPLE VITAMINS-MINERALS ER PO Take by mouth.    . Omega-3 Fatty Acids (FISH OIL) 1000 MG CPDR Take by mouth.    . traMADol (ULTRAM) 50 MG tablet Take 1-2 tablets (50-100 mg total) by mouth 2 (two) times daily as needed. (Patient not taking: Reported on 01/25/2020) 20 tablet 0   No current facility-administered medications for this visit.     PHYSICAL EXAMINATION: ECOG PERFORMANCE STATUS: 2 - Symptomatic, <50% confined to bed Vitals:   01/25/20 1118  BP: 112/77  Pulse: 77  Resp: 18  Temp: 97.6 F (36.4 C)   Filed Weights   01/25/20 1118  Weight: 119 lb 9.6 oz (54.3 kg)    Physical Exam Constitutional:      General: She is not in acute distress. HENT:     Head: Normocephalic and atraumatic.  Eyes:     General: No scleral icterus. Cardiovascular:     Rate and Rhythm: Normal rate and regular rhythm.     Heart sounds: Normal heart sounds.  Pulmonary:     Effort: Pulmonary effort is normal. No respiratory distress.     Breath sounds: No wheezing.  Abdominal:     General: Bowel sounds are normal. There is no distension.     Palpations: Abdomen is soft.  Musculoskeletal:        General: No deformity. Normal range of motion.     Cervical back: Normal range of motion and neck supple.  Skin:    General: Skin is warm and dry.     Findings: No erythema or rash.  Neurological:     Mental Status: She is alert and oriented to person, place, and time. Mental status is at baseline.     Cranial Nerves: No cranial nerve deficit.     Coordination: Coordination normal.  Psychiatric:        Mood and Affect: Mood normal.     LABORATORY DATA:  I have reviewed the data as listed Lab Results  Component Value Date   WBC 14.1 (H) 01/25/2020   HGB 9.8 (L) 01/25/2020   HCT 32.5 (L) 01/25/2020   MCV 63.9 (L) 01/25/2020   PLT 393 01/25/2020   Recent Labs     09/28/19 1053 01/05/20 0741  NA 141 131*  K 4.4 4.6  CL 105 95*  CO2 26 24  GLUCOSE 117* 175*  BUN 30* 34*  CREATININE 0.92* 1.27*  CALCIUM 9.2 9.3  GFRNONAA 56* 38*  GFRAA 64 44*  PROT 7.3 8.1  ALBUMIN  --  3.9  AST 13 32  ALT 10 80*  ALKPHOS  --  141*  BILITOT 0.4 0.7   Iron/TIBC/Ferritin/ %Sat    Component Value Date/Time  IRON 23 (L) 01/25/2020 1145   IRON 87 11/21/2015 0925   TIBC 262 01/25/2020 1145   TIBC 222 (L) 11/21/2015 0925   FERRITIN 373 (H) 01/25/2020 1145   IRONPCTSAT 9 (L) 01/25/2020 1145   IRONPCTSAT 39 04/15/2017 0828      RADIOGRAPHIC STUDIES: I have personally reviewed the radiological images as listed and agreed with the findings in the report. CT Head Wo Contrast  Result Date: 01/05/2020 CLINICAL DATA:  Right side jerking EXAM: CT HEAD WITHOUT CONTRAST TECHNIQUE: Contiguous axial images were obtained from the base of the skull through the vertex without intravenous contrast. COMPARISON:  None. FINDINGS: Brain: Chronic appearing infarct in the left posterior frontal lobe. Chronic small vessel disease throughout the deep white matter. No acute intracranial abnormality. Specifically, no hemorrhage, hydrocephalus, mass lesion, acute infarction, or significant intracranial injury. Vascular: No hyperdense vessel or unexpected calcification. Skull: No acute calvarial abnormality. Sinuses/Orbits: Visualized paranasal sinuses and mastoids clear. Orbital soft tissues unremarkable. Other: None IMPRESSION: Chronic appearing posterior left frontal infarct. Chronic small vessel disease. No acute intracranial abnormality Electronically Signed   By: Rolm Baptise M.D.   On: 01/05/2020 08:50   US ABDOMEN LIMITED RUQ  Result Date: 01/05/2020 CLINICAL DATA:  Elevated LFTs EXAM: ULTRASOUND ABDOMEN LIMITED RIGHT UPPER QUADRANT COMPARISON:  None. FINDINGS: Gallbladder: Prior cholecystectomy Common bile duct: Diameter: Dilated, 14 mm Liver: Coarsened echotexture. Suggestion of  nodular contours. No focal abnormality. Portal vein is patent on color Doppler imaging with normal direction of blood flow towards the liver. Other: Mild right hydronephrosis noted. IMPRESSION: Coarsened echotexture throughout the liver with subtle nodular contour suggesting cirrhosis. Prior cholecystectomy. Common bile duct is dilated at 14 mm. This could be related to post cholecystectomy state. Recommend correlation with LFTs. This could be further evaluated with MRCP if felt clinically indicated. Mild right hydronephrosis incidentally noted. Electronically Signed   By: Rolm Baptise M.D.   On: 01/05/2020 09:39      ASSESSMENT & PLAN:  1. Microcytic anemia   2. Elevated ferritin   3. Transaminitis   4. Abnormal liver ultrasound   Labs reviewed and discussed with patient and daughter. #Chronic microcytic anemia-hemoglobin 9.2, MCV 65.3.  Reviewed previous labs, patient has been chronically macrocytic and anemic.  Ferritin was checked recently and has been high.  Ferritin can be falsely elevated due to numerous conditions.  I will check iron, TIBC ferritin, repeat CBC, smear. She may also have hemoglobinopathy.  Check multiple myeloma panel.  #Elevated ferritin, pending repeat iron panel for further evaluation.  Check hemochromatosis.  Screening panel. #Transaminitis, recent ultrasound showed coarsened echotexture throughout the liver with subtle nodular contour suggesting cirrhosis.  Common bile duct is dilated to 14 mm.  Prior cholecystectomy Mired right hydronephrosis.  Check hepatitis panel, coags.  AFP Elevated alkaline phosphatase level GGT.  # Orders Placed This Encounter  Procedures  . Iron and TIBC    Standing Status:   Future    Number of Occurrences:   1    Standing Expiration Date:   07/27/2021  . Ferritin    Standing Status:   Future    Number of Occurrences:   1    Standing Expiration Date:   07/27/2021  . CBC with Differential/Platelet    Standing Status:   Future     Number of Occurrences:   1    Standing Expiration Date:   07/27/2021  . Technologist smear review    Standing Status:   Future    Number of  Occurrences:   1    Standing Expiration Date:   07/27/2021  . Retic Panel    Standing Status:   Future    Number of Occurrences:   1    Standing Expiration Date:   07/27/2021  . Kappa/lambda light chains    Standing Status:   Future    Number of Occurrences:   1    Standing Expiration Date:   07/27/2021  . Multiple Myeloma Panel (SPEP&IFE w/QIG)    Standing Status:   Future    Number of Occurrences:   1    Standing Expiration Date:   07/27/2021  . Hepatitis panel, acute    Standing Status:   Future    Number of Occurrences:   1    Standing Expiration Date:   07/27/2021  . Lactate dehydrogenase    Standing Status:   Future    Number of Occurrences:   1    Standing Expiration Date:   01/24/2021  . Hgb Fractionation Cascade    Standing Status:   Future    Number of Occurrences:   1    Standing Expiration Date:   01/24/2021  . Hemochromatosis DNA-PCR(c282y,h63d)  . AFP tumor marker    Standing Status:   Future    Number of Occurrences:   1    Standing Expiration Date:   01/24/2021  . Protime-INR    Standing Status:   Future    Number of Occurrences:   1    Standing Expiration Date:   01/24/2021  . APTT    Standing Status:   Future    Number of Occurrences:   1    Standing Expiration Date:   01/24/2021    All questions were answered. The patient knows to call the clinic with any problems questions or concerns.  cc Jannifer Franklin, NP    Return of visit: 2 weeks to discuss results. Thank you for this kind referral and the opportunity to participate in the care of this patient. A copy of today's note is routed to referring provider    Earlie Server, MD, PhD Hematology Oncology [facility] Pager- [phone] 01/25/2020

## 2020-01-26 LAB — KAPPA/LAMBDA LIGHT CHAINS
Kappa free light chain: 36.4 mg/L — ABNORMAL HIGH (ref 3.3–19.4)
Kappa, lambda light chain ratio: 1.19 (ref 0.26–1.65)
Lambda free light chains: 30.6 mg/L — ABNORMAL HIGH (ref 5.7–26.3)

## 2020-01-26 LAB — AFP TUMOR MARKER: AFP, Serum, Tumor Marker: 2 ng/mL (ref 0.0–8.3)

## 2020-01-27 LAB — HEMOCHROMATOSIS DNA-PCR(C282Y,H63D)

## 2020-01-28 LAB — MULTIPLE MYELOMA PANEL, SERUM
Albumin SerPl Elph-Mcnc: 3.6 g/dL (ref 2.9–4.4)
Albumin/Glob SerPl: 1.1 (ref 0.7–1.7)
Alpha 1: 0.2 g/dL (ref 0.0–0.4)
Alpha2 Glob SerPl Elph-Mcnc: 0.9 g/dL (ref 0.4–1.0)
B-Globulin SerPl Elph-Mcnc: 1.1 g/dL (ref 0.7–1.3)
Gamma Glob SerPl Elph-Mcnc: 1.4 g/dL (ref 0.4–1.8)
Globulin, Total: 3.6 g/dL (ref 2.2–3.9)
IgA: 263 mg/dL (ref 64–422)
IgG (Immunoglobin G), Serum: 1460 mg/dL (ref 586–1602)
IgM (Immunoglobulin M), Srm: 76 mg/dL (ref 26–217)
Total Protein ELP: 7.2 g/dL (ref 6.0–8.5)

## 2020-01-28 LAB — HGB FRACTIONATION CASCADE
Hgb A2: 5.5 % — ABNORMAL HIGH (ref 1.8–3.2)
Hgb A: 94.5 % — ABNORMAL LOW (ref 96.4–98.8)
Hgb F: 0 % (ref 0.0–2.0)
Hgb S: 0 %

## 2020-02-09 ENCOUNTER — Inpatient Hospital Stay: Payer: Medicare Other | Attending: Oncology | Admitting: Oncology

## 2020-02-09 ENCOUNTER — Other Ambulatory Visit: Payer: Self-pay

## 2020-02-09 ENCOUNTER — Encounter: Payer: Self-pay | Admitting: Oncology

## 2020-02-09 VITALS — BP 158/73 | HR 62 | Temp 96.7°F | Resp 12

## 2020-02-09 DIAGNOSIS — D563 Thalassemia minor: Secondary | ICD-10-CM | POA: Diagnosis not present

## 2020-02-09 DIAGNOSIS — R7401 Elevation of levels of liver transaminase levels: Secondary | ICD-10-CM | POA: Diagnosis not present

## 2020-02-09 DIAGNOSIS — R7989 Other specified abnormal findings of blood chemistry: Secondary | ICD-10-CM | POA: Diagnosis not present

## 2020-02-09 DIAGNOSIS — D509 Iron deficiency anemia, unspecified: Secondary | ICD-10-CM | POA: Diagnosis not present

## 2020-02-09 DIAGNOSIS — Z79899 Other long term (current) drug therapy: Secondary | ICD-10-CM | POA: Insufficient documentation

## 2020-02-09 DIAGNOSIS — Z8673 Personal history of transient ischemic attack (TIA), and cerebral infarction without residual deficits: Secondary | ICD-10-CM | POA: Diagnosis not present

## 2020-02-09 DIAGNOSIS — R932 Abnormal findings on diagnostic imaging of liver and biliary tract: Secondary | ICD-10-CM | POA: Diagnosis not present

## 2020-02-09 DIAGNOSIS — I10 Essential (primary) hypertension: Secondary | ICD-10-CM | POA: Diagnosis not present

## 2020-02-09 NOTE — Progress Notes (Signed)
Hematology/Oncology follow up note [facility] [phone] [phone]   Patient Care Team: Smitty Cords, DO as PCP - General (Family Medicine)  REFERRING PROVIDER: Saralyn Pilar *  CHIEF COMPLAINTS/REASON FOR VISIT:  Evaluation of high ferritin  HISTORY OF PRESENTING ILLNESS:   Gabriela Robles is a  86 y.o.  female with PMH listed below was seen in consultation at the request of  Saralyn Pilar *  for evaluation of high ferritin level Patient was seen by neurology Dot Lanes for evaluation of intermittent right lower leg pain, cramps. Her work-up showed anemia, microcytic.  Ferritin was elevated in 300s.  Patient was referred to hematology for further work-up. Patient was accompanied by her daughter.  She speaks [place].  Online interpreter service was used for interpretation for the entire encounter. Patient has severe hearing loss and not able to provide history.  History was obtained mainly from daughter. Per daughter patient has been recently feeling fatigued and tired.  Appetite is good.  Patient walks independently in her house.  Able to do her ADLs. Denies any pain today.  Denies any previous alcohol use.  INTERVAL HISTORY Gabriela Robles is a 86 y.o. female who has above history reviewed by me today presents for follow up visit for management of elevated ferritin. Problems and complaints are listed below: Patient was accompanied by grandson today. Patient waived interpreter service and prefers grandson to interpret for her. Patient had blood work done. No new complaints.  Review of Systems  Unable to perform ROS: Age (Severe hearing loss)  Constitutional: Positive for fatigue. Negative for appetite change and fever.  HENT:   Positive for hearing loss.   Respiratory: Negative for cough and shortness of breath.   Gastrointestinal: Negative for abdominal pain.  Genitourinary: Negative for frequency.     Psychiatric/Behavioral: Negative for confusion.    MEDICAL HISTORY:  Past Medical History:  Diagnosis Date  . Acute ischemic left MCA stroke ([facility])   . Arthritis   . Diabetes mellitus without complication ([facility])   . History of stroke 2017  . Hyperlipidemia   . Hypertension     SURGICAL HISTORY: History reviewed. No pertinent surgical history.  SOCIAL HISTORY: Social History   Socioeconomic History  . Marital status: Widowed    Spouse name: Not on file  . Number of children: 10  . Years of education: Not on file  . Highest education level: Not on file  Occupational History  . Occupation: retired  Tobacco Use  . Smoking status: Never Smoker  . Smokeless tobacco: Never Used  Substance and Sexual Activity  . Alcohol use: No    Alcohol/week: 0.0 standard drinks  . Drug use: No  . Sexual activity: Not Currently  Other Topics Concern  . Not on file  Social History Narrative   5 children living and 5 children deceased.    Social Determinants of Health   Financial Resource Strain:   . Difficulty of Paying Living Expenses:   Food Insecurity:   . Worried About Programme researcher, broadcasting/film/video in the Last Year:   . Barista in the Last Year:   Transportation Needs:   . Freight forwarder (Medical):   Marland Kitchen Lack of Transportation (Non-Medical):   Physical Activity:   . Days of Exercise per Week:   . Minutes of Exercise per Session:   Stress:   . Feeling of Stress :   Social Connections:   . Frequency of Communication with Friends and Family:   .  Frequency of Social Gatherings with Friends and Family:   . Attends Religious Services:   . Active Member of Clubs or Organizations:   . Attends Banker Meetings:   Marland Kitchen Marital Status:   Intimate Partner Violence:   . Fear of Current or Ex-Partner:   . Emotionally Abused:   Marland Kitchen Physically Abused:   . Sexually Abused:     FAMILY HISTORY: Family History  Problem Relation Age of Onset  . Cancer Daughter        bone  cancer    ALLERGIES:  has No Known Allergies.  MEDICATIONS:  Current Outpatient Medications  Medication Sig Dispense Refill  . gabapentin (NEURONTIN) 100 MG capsule Take by mouth.    Marland Kitchen lisinopril (ZESTRIL) 10 MG tablet Take 1 tablet (10 mg total) by mouth daily. 90 tablet 3  . CVS ASPIRIN ADULT LOW DOSE 81 MG chewable tablet Chew 81 mg by mouth daily.  0  . MULTIPLE VITAMINS-MINERALS ER PO Take by mouth.    . Omega-3 Fatty Acids (FISH OIL) 1000 MG CPDR Take by mouth.    . predniSONE (DELTASONE) 20 MG tablet Take 2 tablets (40mg ) for 3 days, then 1 tab (20mg ) for 3 days, then half tab (10mg ) for 2 days (Patient not taking: Reported on 02/09/2020) 10 tablet 0  . traMADol (ULTRAM) 50 MG tablet Take 1-2 tablets (50-100 mg total) by mouth 2 (two) times daily as needed. (Patient not taking: Reported on 01/25/2020) 20 tablet 0   No current facility-administered medications for this visit.     PHYSICAL EXAMINATION: ECOG PERFORMANCE STATUS: 2 - Symptomatic, <50% confined to bed Vitals:   02/09/20 1102  BP: (!) 158/73  Pulse: 62  Resp: 12  Temp: (!) 96.7 F (35.9 C)   There were no vitals filed for this visit.  Physical Exam Constitutional:      General: She is not in acute distress. HENT:     Head: Normocephalic and atraumatic.  Eyes:     General: No scleral icterus. Cardiovascular:     Rate and Rhythm: Normal rate and regular rhythm.     Heart sounds: Normal heart sounds.  Pulmonary:     Effort: Pulmonary effort is normal. No respiratory distress.     Breath sounds: No wheezing.  Abdominal:     General: Bowel sounds are normal. There is no distension.     Palpations: Abdomen is soft.  Musculoskeletal:        General: No deformity. Normal range of motion.     Cervical back: Normal range of motion and neck supple.  Skin:    General: Skin is warm and dry.     Findings: No erythema or rash.  Neurological:     Mental Status: She is alert and oriented to person, place, and  time. Mental status is at baseline.     Cranial Nerves: No cranial nerve deficit.     Coordination: Coordination normal.  Psychiatric:        Mood and Affect: Mood normal.     LABORATORY DATA:  I have reviewed the data as listed Lab Results  Component Value Date   WBC 14.1 (H) 01/25/2020   HGB 9.8 (L) 01/25/2020   HCT 32.5 (L) 01/25/2020   MCV 63.9 (L) 01/25/2020   PLT 393 01/25/2020   Recent Labs    09/28/19 1053 01/05/20 0741  NA 141 131*  K 4.4 4.6  CL 105 95*  CO2 26 24  GLUCOSE 117* 175*  BUN  30* 34*  CREATININE 0.92* 1.27*  CALCIUM 9.2 9.3  GFRNONAA 56* 38*  GFRAA 64 44*  PROT 7.3 8.1  ALBUMIN  --  3.9  AST 13 32  ALT 10 80*  ALKPHOS  --  141*  BILITOT 0.4 0.7   Iron/TIBC/Ferritin/ %Sat    Component Value Date/Time   IRON 23 (L) 01/25/2020 1145   IRON 87 11/21/2015 0925   TIBC 262 01/25/2020 1145   TIBC 222 (L) 11/21/2015 0925   FERRITIN 373 (H) 01/25/2020 1145   IRONPCTSAT 9 (L) 01/25/2020 1145   IRONPCTSAT 39 04/15/2017 0828      RADIOGRAPHIC STUDIES: I have personally reviewed the radiological images as listed and agreed with the findings in the report. No results found.    ASSESSMENT & PLAN:  1. Microcytic anemia   2. Abnormal liver ultrasound   3. Beta thalassemia minor   Labs reviewed and discussed with patient and daughter. #Chronic microcytic anemia-hemoglobin 9.2, MCV 65.3. Microcytosis is secondary to beta thalassemia minor.Marland Kitchen #Elevated ferritin, decreased iron saturation. Iron panel is consistent with anemia secondary to chronic disease. Decreased reticulocyte hemoglobin, however this may be decreased due to thalassemia trait. Advised patient to take multivitamin.  #Transaminitis/increased GGT. Hepatitis panel negative. AFP is normal. Ultrasound abdomen showed questionable cirrhosis and dilated common bile duct which may be postsurgical changes. Recommend GI work-up. Grandson prefers patient to be referred to  gastroenterology.   # Orders Placed This Encounter  Procedures  . CBC with Differential/Platelet    Standing Status:   Future    Standing Expiration Date:   05/10/2021  . Comprehensive metabolic panel    Standing Status:   Future    Standing Expiration Date:   05/10/2021  . Retic Panel    Standing Status:   Future    Standing Expiration Date:   05/10/2021  . Ambulatory referral to Gastroenterology    Referral Priority:   Routine    Referral Type:   Consultation    Referral Reason:   Specialty Services Required    Referred to Provider:   Jonathon Bellows, MD    Number of Visits Requested:   1    All questions were answered. The patient knows to call the clinic with any problems questions or concerns.  cc Nobie Putnam *    Return of visit: 3 months   Earlie Server, MD, PhD Hematology Oncology [facility] Pager- [phone] 02/09/2020

## 2020-02-09 NOTE — Progress Notes (Signed)
Patient is here with her grandson today and he reports that she has more energy.  She still has lower extremity edema.

## 2020-02-19 ENCOUNTER — Other Ambulatory Visit: Payer: Self-pay | Admitting: Family Medicine

## 2020-02-19 DIAGNOSIS — M109 Gout, unspecified: Secondary | ICD-10-CM

## 2020-02-19 NOTE — Telephone Encounter (Signed)
Requested medication (s) are due for refill today: yes  Requested medication (s) are on the active medication list: yes  Last refill:  01/12/20  Future visit scheduled: yes  Notes to clinic:  medication not delegated to NT to RF   Requested Prescriptions  Pending Prescriptions Disp Refills   traMADol (ULTRAM) 50 MG tablet [Pharmacy Med Name: TRAMADOL HCL 50 MG TABLET] 20 tablet 0    Sig: Take 1-2 tablets (50-100 mg total) by mouth 2 (two) times daily as needed.      Not Delegated - Analgesics:  Opioid Agonists Failed - 02/19/2020 10:42 AM      Failed - This refill cannot be delegated      Failed - Urine Drug Screen completed in last 360 days.      Passed - Valid encounter within last 6 months    Recent Outpatient Visits           1 month ago Acute gout of right ankle, unspecified cause   [facility], Netta Neat, DO   4 months ago Controlled type 2 diabetes mellitus with diabetic neuropathy, without long-term current use of insulin [facility])   [facility], Netta Neat, DO   1 year ago Controlled type 2 diabetes mellitus with diabetic neuropathy, without long-term current use of insulin [facility])   [facility] Smitty Cords, DO   1 year ago Acute gout of multiple sites, unspecified cause   [facility] Smitty Cords, DO   1 year ago Essential (primary) hypertension   [facility] Althea Charon, Netta Neat, DO       Future Appointments             In 2 months Althea Charon, Netta Neat, DO [facility], [facility]

## 2020-02-22 DIAGNOSIS — M79601 Pain in right arm: Secondary | ICD-10-CM | POA: Diagnosis not present

## 2020-03-01 DIAGNOSIS — R7989 Other specified abnormal findings of blood chemistry: Secondary | ICD-10-CM | POA: Diagnosis not present

## 2020-03-01 DIAGNOSIS — E1165 Type 2 diabetes mellitus with hyperglycemia: Secondary | ICD-10-CM | POA: Diagnosis not present

## 2020-03-01 DIAGNOSIS — E1159 Type 2 diabetes mellitus with other circulatory complications: Secondary | ICD-10-CM | POA: Diagnosis not present

## 2020-03-01 DIAGNOSIS — I10 Essential (primary) hypertension: Secondary | ICD-10-CM | POA: Diagnosis not present

## 2020-03-02 DIAGNOSIS — R252 Cramp and spasm: Secondary | ICD-10-CM | POA: Diagnosis not present

## 2020-04-25 ENCOUNTER — Other Ambulatory Visit: Payer: Medicare Other

## 2020-04-25 DIAGNOSIS — M1A09X0 Idiopathic chronic gout, multiple sites, without tophus (tophi): Secondary | ICD-10-CM | POA: Diagnosis not present

## 2020-04-26 LAB — URIC ACID: Uric Acid, Serum: 10.2 mg/dL — ABNORMAL HIGH (ref 2.5–7.0)

## 2020-05-02 ENCOUNTER — Other Ambulatory Visit: Payer: Self-pay

## 2020-05-02 ENCOUNTER — Encounter: Payer: Self-pay | Admitting: Family Medicine

## 2020-05-02 ENCOUNTER — Other Ambulatory Visit: Payer: Self-pay | Admitting: Family Medicine

## 2020-05-02 ENCOUNTER — Ambulatory Visit (INDEPENDENT_AMBULATORY_CARE_PROVIDER_SITE_OTHER): Payer: Medicare Other | Admitting: Family Medicine

## 2020-05-02 VITALS — BP 120/48 | HR 66 | Temp 97.3°F | Resp 16 | Ht 59.0 in | Wt 116.6 lb

## 2020-05-02 DIAGNOSIS — I10 Essential (primary) hypertension: Secondary | ICD-10-CM | POA: Diagnosis not present

## 2020-05-02 DIAGNOSIS — IMO0002 Reserved for concepts with insufficient information to code with codable children: Secondary | ICD-10-CM

## 2020-05-02 DIAGNOSIS — M1A09X0 Idiopathic chronic gout, multiple sites, without tophus (tophi): Secondary | ICD-10-CM | POA: Diagnosis not present

## 2020-05-02 DIAGNOSIS — I6930 Unspecified sequelae of cerebral infarction: Secondary | ICD-10-CM

## 2020-05-02 DIAGNOSIS — D509 Iron deficiency anemia, unspecified: Secondary | ICD-10-CM

## 2020-05-02 DIAGNOSIS — E782 Mixed hyperlipidemia: Secondary | ICD-10-CM

## 2020-05-02 DIAGNOSIS — G720 Drug-induced myopathy: Secondary | ICD-10-CM

## 2020-05-02 DIAGNOSIS — E1140 Type 2 diabetes mellitus with diabetic neuropathy, unspecified: Secondary | ICD-10-CM

## 2020-05-02 DIAGNOSIS — E1142 Type 2 diabetes mellitus with diabetic polyneuropathy: Secondary | ICD-10-CM | POA: Diagnosis not present

## 2020-05-02 DIAGNOSIS — E1165 Type 2 diabetes mellitus with hyperglycemia: Secondary | ICD-10-CM

## 2020-05-02 MED ORDER — TRAMADOL HCL 50 MG PO TABS: 50.0000 mg | ORAL_TABLET | Freq: Two times a day (BID) | ORAL | 1 refills | Status: DC | PRN

## 2020-05-02 NOTE — Patient Instructions (Addendum)
Thank you for coming to the office today.  Refilled Gout medicine - Tramadol for pain only if flare up.  Recent Labs    09/28/19 1031 01/13/20 0000  HGBA1C 7.6* 9.3   Elevated sugar - keep up with Diabetes   DUE for FASTING BLOOD WORK (no food or drink after midnight before the lab appointment, only water or coffee without cream/sugar on the morning of)  SCHEDULE "Lab Only" visit in the morning at the clinic for lab draw in 6 MONTHS   - Make sure Lab Only appointment is at about 1 week before your next appointment, so that results will be available  For Lab Results, once available within 2-3 days of blood draw, you can can log in to MyChart online to view your results and a brief explanation. Also, we can discuss results at next follow-up visit.    Please schedule a Follow-up Appointment to: Return in about 6 months (around 11/02/2020) for Yearly Medicare Checkup.  If you have any other questions or concerns, please feel free to call the office or send a message through MyChart. You may also schedule an earlier appointment if necessary.  Additionally, you may be receiving a survey about your experience at our office within a few days to 1 week by e-mail or mail. We value your feedback.  Saralyn Pilar, DO [facility] [place]

## 2020-05-02 NOTE — Assessment & Plan Note (Signed)
Failed previous statin therapy, atorvastatin, simvastatin Declines repeat course at this time

## 2020-05-02 NOTE — Assessment & Plan Note (Signed)
Previously better control, now w/ [facility] Dr Gershon Crane Last A1c 9.3 in 12/2019 Complications - peripheral neuropathy  Plan:  1. Not on any medication 2. Encourage improved lifestyle - low carb, low sugar diet, reduce portion size, continue improving regular exercise 3. Continue ASA, ACEi

## 2020-05-02 NOTE — Assessment & Plan Note (Signed)
Controlled - Home BP readings, none available still No known complications / History of CVA on ASA    Plan:  1. Continue current BP regimen - Lisinopril 10mg daily 2. Encourage improved lifestyle - improve hydration, low sodium diet exercise 

## 2020-05-02 NOTE — Assessment & Plan Note (Signed)
Persistent chronic problem  Reduced monofilament sensation on foot exam, has intact sensation to light touch See exam for details

## 2020-05-02 NOTE — Progress Notes (Signed)
Subjective:    Patient ID: Gabriela Robles, female    DOB: 1929/04/18, 86 y.o.   MRN: [id number]  Gabriela Robles is a 86 y.o. female presenting on 05/02/2020 for Gout (6 month follow up)   HPI    FOLLOW-UPCHRONIC HTN: - Today patient reportsdoing well.No BP checks outside officestill. Current Meds - Lisinopril 5mg  daily in AM  Reports good compliance, did NOT take meds today. Tolerating well, w/o complaints. - Continue ASA 81mg  for risk reduction with prior CVA  Anemia, iron deficiency/ Fatigue: See prior notes for history of anemia and lab results. Has improved on last lab check Hgb No new acute complaints today, last seen Heme 01/2020. Has anemia of chronic disease, on MVI. - Regular exercises daily, active walks around house and outdoors ingarden, very functional -Denies any episodes of bleeding, sleepiness, syncope, chest pain, or dyspnea, poor appetite, constipation, abdominal pain  CHRONIC DM, Type 2 - Diet Controlled/ DM Neuropathy Last lab A1c was elevated 12/2019 Now followed by Dr 02/2020 [facility] Endocrinology CBGs: No longer checking CBG. Meds: metformin XR 500 BID Currently on ACEi Diet isbalanced,some sweets still - appetite is good overall. Has not done DM Eye exam due to COVID concerns Denies hypoglycemia  Transaminitis / Abnormal Liver Gershon Crane Recent lab review from Heme with elevated GGT and abnormal Liver ultrasound, questionable cirrhosis, referred to GI.   PMH -ResidualRupper extremity weakness, from CVA Reports chronic weakness of Rgrip and upper extremity, without any recent changes  Follow-up Gout, R Foot / Ankle Gout flare in March 2021 - colder weather only Has Tramadol PRN for flare, needs refill Prior Uric acid elevated  Health Maintenance:  Has not received COVID19 vaccine yet.  Depression screen [facility] 2/9 05/02/2020 01/12/2020 11/02/2019  Decreased Interest 0 0 0  Down, Depressed, Hopeless 0 0 0  PHQ - 2 Score 0 0 0    Social  History   Tobacco Use  . Smoking status: Never Smoker  . Smokeless tobacco: Never Used  Vaping Use  . Vaping Use: Never used  Substance Use Topics  . Alcohol use: No    Alcohol/week: 0.0 standard drinks  . Drug use: No    Review of Systems Per HPI unless specifically indicated above     Objective:    BP (!) 120/48   Pulse 66   Temp (!) 97.3 F (36.3 C) (Temporal)   Resp 16   Ht 4\' 11"  (1.499 m)   Wt 116 lb 9.6 oz (52.9 kg)   SpO2 100%   BMI 23.55 kg/m   Wt Readings from Last 3 Encounters:  05/02/20 116 lb 9.6 oz (52.9 kg)  01/25/20 119 lb 9.6 oz (54.3 kg)  01/12/20 121 lb (54.9 kg)    Physical Exam Vitals and nursing note reviewed.  Constitutional:      General: She is not in acute distress.    Appearance: She is well-developed. She is not diaphoretic.     Comments: Well appearing, thin appearing elderly 86 year old female  HENT:     Head: Normocephalic and atraumatic.  Eyes:     Conjunctiva/sclera: Conjunctivae normal.     Pupils: Pupils are equal, round, and reactive to light.  Neck:     Thyroid: No thyromegaly.  Cardiovascular:     Rate and Rhythm: Normal rate and regular rhythm.     Heart sounds: Normal heart sounds. No murmur heard.   Pulmonary:     Effort: Pulmonary effort is normal. No respiratory distress.  Breath sounds: Normal breath sounds. No wheezing or rales.  Abdominal:     General: Bowel sounds are normal. There is no distension.     Palpations: Abdomen is soft. There is no mass.     Tenderness: There is no abdominal tenderness.  Musculoskeletal:        General: No tenderness. Normal range of motion.     Cervical back: Normal range of motion and neck supple.  Lymphadenopathy:     Cervical: No cervical adenopathy.  Skin:    General: Skin is warm and dry.     Findings: No rash.  Neurological:     Mental Status: She is alert and oriented to person, place, and time.  Psychiatric:        Behavior: Behavior normal.      Diabetic  Foot Exam - Simple   Simple Foot Form Diabetic Foot exam was performed with the following findings: Yes 05/02/2020 11:20 AM  Visual Inspection See comments: Yes Sensation Testing See comments: Yes Pulse Check Posterior Tibialis and Dorsalis pulse intact bilaterally: Yes Comments Significantly reduced monofilament sensation bilateral feet plantar and dorsal, chronic problem. Callus formation without ulceration.     Results for orders placed or performed in visit on 05/02/20  Hemoglobin A1c  Result Value Ref Range   Hemoglobin A1C 9.3       Assessment & Plan:   Problem List Items Addressed This Visit    Type 2 diabetes mellitus, uncontrolled, with neuropathy (HCC)    Previously better control, now w/ [facility] Endocrinology Dr Gershon Crane Last A1c 9.3 in 12/2019 Complications - peripheral neuropathy  Plan:  1. Not on any medication 2. Encourage improved lifestyle - low carb, low sugar diet, reduce portion size, continue improving regular exercise 3. Continue ASA, ACEi      Relevant Medications   metFORMIN (GLUCOPHAGE-XR) 500 MG 24 hr tablet   Essential (primary) hypertension    Controlled - Home BP readings, none available still No known complications / History of CVA on ASA    Plan:  1. Continue current BP regimen - Lisinopril 10mg  daily 2. Encourage improved lifestyle - improve hydration, low sodium diet exercise      Drug-induced myopathy    Failed previous statin therapy, atorvastatin, simvastatin Declines repeat course at this time      Diabetic peripheral neuropathy (HCC) - Primary    Persistent chronic problem  Reduced monofilament sensation on foot exam, has intact sensation to light touch See exam for details      Relevant Medications   metFORMIN (GLUCOPHAGE-XR) 500 MG 24 hr tablet   Chronic gout of multiple sites   Relevant Medications   traMADol (ULTRAM) 50 MG tablet      Meds ordered this encounter  Medications  . traMADol (ULTRAM) 50 MG tablet     Sig: Take 1-2 tablets (50-100 mg total) by mouth 2 (two) times daily as needed.    Dispense:  20 tablet    Refill:  1    Not to exceed 5 additional fills before 07/10/2020      Follow up plan: Return in about 6 months (around 11/02/2020) for Yearly Medicare Checkup.  Future labs ordered for 11/01/20   12/30/20, DO [facility] 05/02/2020, 11:15 AM

## 2020-05-10 ENCOUNTER — Other Ambulatory Visit: Payer: Medicare Other

## 2020-05-10 ENCOUNTER — Ambulatory Visit: Payer: Medicare Other | Admitting: Oncology

## 2020-05-12 ENCOUNTER — Encounter: Payer: Self-pay | Admitting: Oncology

## 2020-05-12 ENCOUNTER — Inpatient Hospital Stay (HOSPITAL_BASED_OUTPATIENT_CLINIC_OR_DEPARTMENT_OTHER): Payer: Medicare Other | Admitting: Oncology

## 2020-05-12 ENCOUNTER — Other Ambulatory Visit: Payer: Self-pay

## 2020-05-12 ENCOUNTER — Inpatient Hospital Stay: Payer: Medicare Other | Attending: Oncology

## 2020-05-12 VITALS — BP 147/64 | HR 57 | Resp 18

## 2020-05-12 DIAGNOSIS — R932 Abnormal findings on diagnostic imaging of liver and biliary tract: Secondary | ICD-10-CM

## 2020-05-12 DIAGNOSIS — D509 Iron deficiency anemia, unspecified: Secondary | ICD-10-CM

## 2020-05-12 DIAGNOSIS — E785 Hyperlipidemia, unspecified: Secondary | ICD-10-CM | POA: Diagnosis not present

## 2020-05-12 DIAGNOSIS — Z7982 Long term (current) use of aspirin: Secondary | ICD-10-CM | POA: Insufficient documentation

## 2020-05-12 DIAGNOSIS — Z8673 Personal history of transient ischemic attack (TIA), and cerebral infarction without residual deficits: Secondary | ICD-10-CM | POA: Insufficient documentation

## 2020-05-12 DIAGNOSIS — E119 Type 2 diabetes mellitus without complications: Secondary | ICD-10-CM | POA: Diagnosis not present

## 2020-05-12 DIAGNOSIS — D563 Thalassemia minor: Secondary | ICD-10-CM

## 2020-05-12 DIAGNOSIS — Z79899 Other long term (current) drug therapy: Secondary | ICD-10-CM | POA: Insufficient documentation

## 2020-05-12 DIAGNOSIS — I10 Essential (primary) hypertension: Secondary | ICD-10-CM | POA: Insufficient documentation

## 2020-05-12 DIAGNOSIS — Z7984 Long term (current) use of oral hypoglycemic drugs: Secondary | ICD-10-CM | POA: Diagnosis not present

## 2020-05-12 LAB — COMPREHENSIVE METABOLIC PANEL
ALT: 13 U/L (ref 0–44)
AST: 16 U/L (ref 15–41)
Albumin: 3.9 g/dL (ref 3.5–5.0)
Alkaline Phosphatase: 91 U/L (ref 38–126)
Anion gap: 9 (ref 5–15)
BUN: 40 mg/dL — ABNORMAL HIGH (ref 8–23)
CO2: 23 mmol/L (ref 22–32)
Calcium: 8.6 mg/dL — ABNORMAL LOW (ref 8.9–10.3)
Chloride: 105 mmol/L (ref 98–111)
Creatinine, Ser: 1.14 mg/dL — ABNORMAL HIGH (ref 0.44–1.00)
GFR calc Af Amer: 50 mL/min — ABNORMAL LOW (ref >60)
GFR calc non Af Amer: 43 mL/min — ABNORMAL LOW (ref >60)
Glucose, Bld: 132 mg/dL — ABNORMAL HIGH (ref 70–99)
Potassium: 4.8 mmol/L (ref 3.5–5.1)
Sodium: 137 mmol/L (ref 135–145)
Total Bilirubin: 0.4 mg/dL (ref 0.3–1.2)
Total Protein: 7.2 g/dL (ref 6.5–8.1)

## 2020-05-12 LAB — RETIC PANEL
Immature Retic Fract: 25.9 % — ABNORMAL HIGH (ref 2.3–15.9)
RBC.: 4.85 MIL/uL (ref 3.87–5.11)
Retic Count, Absolute: 116.9 10*3/uL (ref 19.0–186.0)
Retic Ct Pct: 2.4 % (ref 0.4–3.1)
Reticulocyte Hemoglobin: 22.7 pg — ABNORMAL LOW (ref >27.9)

## 2020-05-12 LAB — CBC WITH DIFFERENTIAL/PLATELET
Abs Immature Granulocytes: 0.05 10*3/uL (ref 0.00–0.07)
Basophils Absolute: 0 10*3/uL (ref 0.0–0.1)
Basophils Relative: 0 %
Eosinophils Absolute: 0.1 10*3/uL (ref 0.0–0.5)
Eosinophils Relative: 3 %
HCT: 30.3 % — ABNORMAL LOW (ref 36.0–46.0)
Hemoglobin: 9.3 g/dL — ABNORMAL LOW (ref 12.0–15.0)
Immature Granulocytes: 1 %
Lymphocytes Relative: 41 %
Lymphs Abs: 2.1 10*3/uL (ref 0.7–4.0)
MCH: 19.4 pg — ABNORMAL LOW (ref 26.0–34.0)
MCHC: 30.7 g/dL (ref 30.0–36.0)
MCV: 63.1 fL — ABNORMAL LOW (ref 80.0–100.0)
Monocytes Absolute: 0.4 10*3/uL (ref 0.1–1.0)
Monocytes Relative: 7 %
Neutro Abs: 2.6 10*3/uL (ref 1.7–7.7)
Neutrophils Relative %: 48 %
Platelets: 227 10*3/uL (ref 150–400)
RBC: 4.8 MIL/uL (ref 3.87–5.11)
RDW: 17.9 % — ABNORMAL HIGH (ref 11.5–15.5)
WBC: 5.3 10*3/uL (ref 4.0–10.5)
nRBC: 0 % (ref 0.0–0.2)

## 2020-05-12 LAB — IRON AND TIBC
Iron: 94 ug/dL (ref 28–170)
Saturation Ratios: 35 % — ABNORMAL HIGH (ref 10.4–31.8)
TIBC: 272 ug/dL (ref 250–450)
UIBC: 178 ug/dL

## 2020-05-12 LAB — FERRITIN: Ferritin: 241 ng/mL (ref 11–307)

## 2020-05-12 MED ORDER — VITRON-C 65-125 MG PO TABS: 1.0000 | ORAL_TABLET | Freq: Every day | ORAL | 0 refills | Status: DC

## 2020-05-12 NOTE — Progress Notes (Signed)
Patient denies any concerns today.  

## 2020-05-12 NOTE — Progress Notes (Signed)
Hematology/Oncology follow up note [facility] [phone] [phone]) [phone]   Patient Care Team: Smitty Cords, DO as PCP - General (Family Medicine)  REFERRING PROVIDER: Saralyn Pilar *  CHIEF COMPLAINTS/REASON FOR VISIT:  Follow up for anemia.    HISTORY OF PRESENTING ILLNESS:  Gabriela Robles is a  86 y.o.  female with PMH listed below was seen in consultation at the request of  Saralyn Pilar *  for evaluation of high ferritin level Patient was seen by neurology Dot Lanes for evaluation of intermittent right lower leg pain, cramps. Her work-up showed anemia, microcytic.  Ferritin was elevated in 300s.  Patient was referred to hematology for further work-up. Patient was accompanied by her daughter.  She speaks [place].  Online interpreter service was used for interpretation for the entire encounter. Patient has severe hearing loss and not able to provide history.  History was obtained mainly from daughter. Per daughter patient has been recently feeling fatigued and tired.  Appetite is good.  Patient walks independently in her house.  Able to do her ADLs. Denies any pain today.  Denies any previous alcohol use.  INTERVAL HISTORY Gabriela Robles is a 86 y.o. female who has above history reviewed by me today presents for follow up visit for management of elevated ferritin. Problems and complaints are listed below: Patient is accompanied by daughter today. Patient is hard hearing.  Most history was obtained from daughter. Patient is not taking multivitamin.  Last time patient was referred to GI for abnormal ultrasound/cirrhosis.  She has not established care yet.  Review of Systems  Unable to perform ROS: Age (Severe hearing loss)  Constitutional: Positive for fatigue. Negative for appetite change and fever.  HENT:   Positive for hearing loss.   Respiratory: Negative for cough and shortness of breath.     Gastrointestinal: Negative for abdominal pain.  Genitourinary: Negative for frequency.   Psychiatric/Behavioral: Negative for confusion.    MEDICAL HISTORY:  Past Medical History:  Diagnosis Date  . Acute ischemic left MCA stroke (HCC)   . Arthritis   . Diabetes mellitus without complication (HCC)   . History of stroke 2017  . Hyperlipidemia   . Hypertension     SURGICAL HISTORY: History reviewed. No pertinent surgical history.  SOCIAL HISTORY: Social History   Socioeconomic History  . Marital status: Widowed    Spouse name: Not on file  . Number of children: 10  . Years of education: Not on file  . Highest education level: Not on file  Occupational History  . Occupation: retired  Tobacco Use  . Smoking status: Never Smoker  . Smokeless tobacco: Never Used  Vaping Use  . Vaping Use: Never used  Substance and Sexual Activity  . Alcohol use: No    Alcohol/week: 0.0 standard drinks  . Drug use: No  . Sexual activity: Not Currently  Other Topics Concern  . Not on file  Social History Narrative   5 children living and 5 children deceased.    Social Determinants of Health   Financial Resource Strain:   . Difficulty of Paying Living Expenses:   Food Insecurity:   . Worried About Programme researcher, broadcasting/film/video in the Last Year:   . Barista in the Last Year:   Transportation Needs:   . Freight forwarder (Medical):   Marland Kitchen Lack of Transportation (Non-Medical):   Physical Activity:   . Days of Exercise per Week:   . Minutes of Exercise per  Session:   Stress:   . Feeling of Stress :   Social Connections:   . Frequency of Communication with Friends and Family:   . Frequency of Social Gatherings with Friends and Family:   . Attends Religious Services:   . Active Member of Clubs or Organizations:   . Attends Banker Meetings:   Marland Kitchen Marital Status:   Intimate Partner Violence:   . Fear of Current or Ex-Partner:   . Emotionally Abused:   Marland Kitchen Physically  Abused:   . Sexually Abused:     FAMILY HISTORY: Family History  Problem Relation Age of Onset  . Cancer Daughter        bone cancer    ALLERGIES:  has No Known Allergies.  MEDICATIONS:  Current Outpatient Medications  Medication Sig Dispense Refill  . [facility] ASPIRIN ADULT LOW DOSE 81 MG chewable tablet Chew 81 mg by mouth daily.  0  . gabapentin (NEURONTIN) 100 MG capsule Take by mouth.    Marland Kitchen lisinopril (ZESTRIL) 10 MG tablet Take 1 tablet (10 mg total) by mouth daily. 90 tablet 3  . metFORMIN (GLUCOPHAGE-XR) 500 MG 24 hr tablet Take 500 mg by mouth 2 (two) times daily with a meal.    . MULTIPLE VITAMINS-MINERALS ER PO Take by mouth.    . Omega-3 Fatty Acids (FISH OIL) 1000 MG CPDR Take by mouth.    . traMADol (ULTRAM) 50 MG tablet Take 1-2 tablets (50-100 mg total) by mouth 2 (two) times daily as needed. 20 tablet 1   No current facility-administered medications for this visit.     PHYSICAL EXAMINATION: ECOG PERFORMANCE STATUS: 2 - Symptomatic, <50% confined to bed Vitals:   05/12/20 1005  BP: (!) 147/64  Pulse: (!) 57  Resp: 18   There were no vitals filed for this visit.  Physical Exam Constitutional:      General: She is not in acute distress. HENT:     Head: Normocephalic and atraumatic.  Eyes:     General: No scleral icterus. Cardiovascular:     Rate and Rhythm: Normal rate and regular rhythm.     Heart sounds: Normal heart sounds.  Pulmonary:     Effort: Pulmonary effort is normal. No respiratory distress.     Breath sounds: No wheezing.  Abdominal:     General: Bowel sounds are normal. There is no distension.     Palpations: Abdomen is soft.  Musculoskeletal:        General: No deformity. Normal range of motion.     Cervical back: Normal range of motion and neck supple.  Skin:    General: Skin is warm and dry.     Findings: No erythema or rash.  Neurological:     Mental Status: She is alert. Mental status is at baseline.     Coordination:  Coordination normal.  Psychiatric:        Mood and Affect: Mood normal.     LABORATORY DATA:  I have reviewed the data as listed Lab Results  Component Value Date   WBC 5.3 05/12/2020   HGB 9.3 (L) 05/12/2020   HCT 30.3 (L) 05/12/2020   MCV 63.1 (L) 05/12/2020   PLT 227 05/12/2020   Recent Labs    09/28/19 1053 01/05/20 0741 05/12/20 0936  NA 141 131* 137  K 4.4 4.6 4.8  CL 105 95* 105  CO2 26 24 23   GLUCOSE 117* 175* 132*  BUN 30* 34* 40*  CREATININE 0.92* 1.27* 1.14*  CALCIUM 9.2 9.3 8.6*  GFRNONAA 56* 38* 43*  GFRAA 64 44* 50*  PROT 7.3 8.1 7.2  ALBUMIN  --  3.9 3.9  AST 13 32 16  ALT 10 80* 13  ALKPHOS  --  141* 91  BILITOT 0.4 0.7 0.4   Iron/TIBC/Ferritin/ %Sat    Component Value Date/Time   IRON 23 (L) 01/25/2020 1145   IRON 87 11/21/2015 0925   TIBC 262 01/25/2020 1145   TIBC 222 (L) 11/21/2015 0925   FERRITIN 373 (H) 01/25/2020 1145   IRONPCTSAT 9 (L) 01/25/2020 1145   IRONPCTSAT 39 04/15/2017 0828      RADIOGRAPHIC STUDIES: I have personally reviewed the radiological images as listed and agreed with the findings in the report. No results found.    ASSESSMENT & PLAN:  1. Microcytic anemia   2. Abnormal liver ultrasound   3. Beta thalassemia minor     Labs are reviewed and discussed with patient Chronic microcytic anemia, hemoglobin 9.3, microcytosis is most likely secondary to beta thalassemia minor. Patient has a history of elevated ferritin, decreased iron saturation.  Reticulocyte hemoglobin is decreased. Discussed with patient and daughter that I will start patient with a trial of oral iron supplementation Vitron C daily. Rationale and potential side effects were discussed with patient.  Advised patient to take over-the-counter stool softeners if patient develops constipation. We will repeat blood work in 3 months for evaluation of treatment response  History of transaminitis and increased GGT.  Labs are reviewed.  Transaminitis level  has resolved. AFP was checked and was previously normal. Ultrasound abdomen showed questionable cirrhosis dilated common bile duct which may be postsurgical changes. I have previously discussed with patient and family prefers to be referred to gastroenterology. Patient has not established care yet.  Will provide to Dr. Johnney Killian office information and daughter will call   Orders Placed This Encounter  Procedures  . CBC with Differential/Platelet    Standing Status:   Future    Standing Expiration Date:   05/12/2021  . Ferritin    Standing Status:   Future    Standing Expiration Date:   05/12/2021  . Iron and TIBC    Standing Status:   Future    Standing Expiration Date:   05/12/2021    All questions were answered. The patient knows to call the clinic with any problems questions or concerns.  Return of visit: 3 months   Rickard Patience, MD, PhD Hematology Oncology [facility] at [facility] Pager- [phone] 05/12/2020

## 2020-08-07 ENCOUNTER — Inpatient Hospital Stay: Payer: Medicare Other | Attending: Oncology

## 2020-08-09 ENCOUNTER — Inpatient Hospital Stay: Payer: Medicare Other | Admitting: Oncology

## 2020-09-28 ENCOUNTER — Other Ambulatory Visit: Payer: Self-pay | Admitting: Family Medicine

## 2020-09-28 DIAGNOSIS — I10 Essential (primary) hypertension: Secondary | ICD-10-CM

## 2020-10-02 ENCOUNTER — Ambulatory Visit: Payer: Medicare Other | Admitting: Family Medicine

## 2020-10-27 ENCOUNTER — Other Ambulatory Visit: Payer: Self-pay | Admitting: Oncology

## 2020-10-29 ENCOUNTER — Encounter: Payer: Self-pay | Admitting: Emergency Medicine

## 2020-10-29 ENCOUNTER — Inpatient Hospital Stay
Admission: EM | Admit: 2020-10-29 | Discharge: 2020-11-02 | DRG: 177 | Disposition: A | Discharge status: Home Health | Payer: Medicare Other | Attending: Internal Medicine | Admitting: Internal Medicine

## 2020-10-29 ENCOUNTER — Emergency Department: Payer: Medicare Other

## 2020-10-29 DIAGNOSIS — U071 COVID-19: Principal | ICD-10-CM | POA: Diagnosis present

## 2020-10-29 DIAGNOSIS — R112 Nausea with vomiting, unspecified: Secondary | ICD-10-CM | POA: Diagnosis not present

## 2020-10-29 DIAGNOSIS — Z809 Family history of malignant neoplasm, unspecified: Secondary | ICD-10-CM

## 2020-10-29 DIAGNOSIS — I6930 Unspecified sequelae of cerebral infarction: Secondary | ICD-10-CM | POA: Diagnosis not present

## 2020-10-29 DIAGNOSIS — E876 Hypokalemia: Secondary | ICD-10-CM | POA: Diagnosis not present

## 2020-10-29 DIAGNOSIS — I248 Other forms of acute ischemic heart disease: Secondary | ICD-10-CM | POA: Diagnosis present

## 2020-10-29 DIAGNOSIS — I10 Essential (primary) hypertension: Secondary | ICD-10-CM | POA: Diagnosis present

## 2020-10-29 DIAGNOSIS — A0839 Other viral enteritis: Secondary | ICD-10-CM | POA: Diagnosis present

## 2020-10-29 DIAGNOSIS — I7090 Unspecified atherosclerosis: Secondary | ICD-10-CM

## 2020-10-29 DIAGNOSIS — Z7984 Long term (current) use of oral hypoglycemic drugs: Secondary | ICD-10-CM

## 2020-10-29 DIAGNOSIS — J9601 Acute respiratory failure with hypoxia: Secondary | ICD-10-CM | POA: Diagnosis present

## 2020-10-29 DIAGNOSIS — IMO0002 Reserved for concepts with insufficient information to code with codable children: Secondary | ICD-10-CM | POA: Diagnosis present

## 2020-10-29 DIAGNOSIS — E860 Dehydration: Secondary | ICD-10-CM | POA: Diagnosis not present

## 2020-10-29 DIAGNOSIS — D509 Iron deficiency anemia, unspecified: Secondary | ICD-10-CM | POA: Diagnosis not present

## 2020-10-29 DIAGNOSIS — E785 Hyperlipidemia, unspecified: Secondary | ICD-10-CM | POA: Diagnosis present

## 2020-10-29 DIAGNOSIS — E1140 Type 2 diabetes mellitus with diabetic neuropathy, unspecified: Secondary | ICD-10-CM | POA: Diagnosis present

## 2020-10-29 DIAGNOSIS — I119 Hypertensive heart disease without heart failure: Secondary | ICD-10-CM | POA: Diagnosis present

## 2020-10-29 DIAGNOSIS — Z79899 Other long term (current) drug therapy: Secondary | ICD-10-CM

## 2020-10-29 DIAGNOSIS — R778 Other specified abnormalities of plasma proteins: Secondary | ICD-10-CM | POA: Diagnosis not present

## 2020-10-29 DIAGNOSIS — E1165 Type 2 diabetes mellitus with hyperglycemia: Secondary | ICD-10-CM | POA: Diagnosis not present

## 2020-10-29 DIAGNOSIS — I4510 Unspecified right bundle-branch block: Secondary | ICD-10-CM | POA: Diagnosis present

## 2020-10-29 DIAGNOSIS — Z7982 Long term (current) use of aspirin: Secondary | ICD-10-CM

## 2020-10-29 DIAGNOSIS — J1282 Pneumonia due to coronavirus disease 2019: Secondary | ICD-10-CM | POA: Diagnosis present

## 2020-10-29 DIAGNOSIS — D649 Anemia, unspecified: Secondary | ICD-10-CM | POA: Diagnosis present

## 2020-10-29 DIAGNOSIS — R7989 Other specified abnormal findings of blood chemistry: Secondary | ICD-10-CM | POA: Diagnosis present

## 2020-10-29 DIAGNOSIS — Z9049 Acquired absence of other specified parts of digestive tract: Secondary | ICD-10-CM

## 2020-10-29 DIAGNOSIS — K219 Gastro-esophageal reflux disease without esophagitis: Secondary | ICD-10-CM | POA: Diagnosis present

## 2020-10-29 DIAGNOSIS — I700 Atherosclerosis of aorta: Secondary | ICD-10-CM | POA: Diagnosis present

## 2020-10-29 LAB — URINALYSIS, COMPLETE (UACMP) WITH MICROSCOPIC
Bilirubin Urine: NEGATIVE
Glucose, UA: NEGATIVE mg/dL
Ketones, ur: 5 mg/dL — AB
Nitrite: POSITIVE — AB
Protein, ur: NEGATIVE mg/dL
Specific Gravity, Urine: 1.028 (ref 1.005–1.030)
WBC, UA: >50 WBC/hpf — ABNORMAL HIGH (ref 0–5)
pH: 5 (ref 5.0–8.0)

## 2020-10-29 LAB — MAGNESIUM: Magnesium: 1.9 mg/dL (ref 1.7–2.4)

## 2020-10-29 LAB — TROPONIN I (HIGH SENSITIVITY)
Troponin I (High Sensitivity): 45 ng/L — ABNORMAL HIGH (ref <18)
Troponin I (High Sensitivity): 47 ng/L — ABNORMAL HIGH (ref <18)
Troponin I (High Sensitivity): 57 ng/L — ABNORMAL HIGH (ref <18)
Troponin I (High Sensitivity): 66 ng/L — ABNORMAL HIGH (ref <18)

## 2020-10-29 LAB — HEPATIC FUNCTION PANEL
ALT: 14 U/L (ref 0–44)
AST: 26 U/L (ref 15–41)
Albumin: 3.2 g/dL — ABNORMAL LOW (ref 3.5–5.0)
Alkaline Phosphatase: 53 U/L (ref 38–126)
Bilirubin, Direct: <0.1 mg/dL (ref 0.0–0.2)
Total Bilirubin: 0.6 mg/dL (ref 0.3–1.2)
Total Protein: 7 g/dL (ref 6.5–8.1)

## 2020-10-29 LAB — BASIC METABOLIC PANEL
Anion gap: 12 (ref 5–15)
BUN: 21 mg/dL (ref 8–23)
CO2: 21 mmol/L — ABNORMAL LOW (ref 22–32)
Calcium: 8.4 mg/dL — ABNORMAL LOW (ref 8.9–10.3)
Chloride: 97 mmol/L — ABNORMAL LOW (ref 98–111)
Creatinine, Ser: 1.01 mg/dL — ABNORMAL HIGH (ref 0.44–1.00)
GFR, Estimated: 53 mL/min — ABNORMAL LOW (ref >60)
Glucose, Bld: 131 mg/dL — ABNORMAL HIGH (ref 70–99)
Potassium: 3.4 mmol/L — ABNORMAL LOW (ref 3.5–5.1)
Sodium: 130 mmol/L — ABNORMAL LOW (ref 135–145)

## 2020-10-29 LAB — CBC
HCT: 32.3 % — ABNORMAL LOW (ref 36.0–46.0)
Hemoglobin: 10.1 g/dL — ABNORMAL LOW (ref 12.0–15.0)
MCH: 19.3 pg — ABNORMAL LOW (ref 26.0–34.0)
MCHC: 31.3 g/dL (ref 30.0–36.0)
MCV: 61.9 fL — ABNORMAL LOW (ref 80.0–100.0)
Platelets: 177 10*3/uL (ref 150–400)
RBC: 5.22 MIL/uL — ABNORMAL HIGH (ref 3.87–5.11)
RDW: 15.9 % — ABNORMAL HIGH (ref 11.5–15.5)
WBC: 5.4 10*3/uL (ref 4.0–10.5)
nRBC: 0 % (ref 0.0–0.2)

## 2020-10-29 LAB — TRIGLYCERIDES: Triglycerides: 127 mg/dL (ref <150)

## 2020-10-29 LAB — LACTIC ACID, PLASMA
Lactic Acid, Venous: 1.7 mmol/L (ref 0.5–1.9)
Lactic Acid, Venous: 1.6 mmol/L (ref 0.5–1.9)

## 2020-10-29 LAB — BRAIN NATRIURETIC PEPTIDE: B Natriuretic Peptide: 80.9 pg/mL (ref 0.0–100.0)

## 2020-10-29 LAB — FERRITIN: Ferritin: 849 ng/mL — ABNORMAL HIGH (ref 11–307)

## 2020-10-29 LAB — POC SARS CORONAVIRUS 2 AG -  ED
SARS Coronavirus 2 Ag: POSITIVE — AB
SARS Coronavirus 2 Ag: POSITIVE — AB

## 2020-10-29 LAB — FIBRIN DERIVATIVES D-DIMER (ARMC ONLY): Fibrin derivatives D-dimer (ARMC): 735.69 ng/mL (FEU) — ABNORMAL HIGH (ref 0.00–499.00)

## 2020-10-29 LAB — GLUCOSE, CAPILLARY: Glucose-Capillary: 131 mg/dL — ABNORMAL HIGH (ref 70–99)

## 2020-10-29 LAB — PROCALCITONIN: Procalcitonin: <0.1 ng/mL

## 2020-10-29 LAB — LACTATE DEHYDROGENASE: LDH: 227 U/L — ABNORMAL HIGH (ref 98–192)

## 2020-10-29 LAB — FIBRINOGEN: Fibrinogen: 527 mg/dL — ABNORMAL HIGH (ref 210–475)

## 2020-10-29 MED ORDER — SODIUM CHLORIDE 0.9 % IV SOLN
100.0000 mg | Freq: Every day | INTRAVENOUS | Status: DC
  Administered 2020-10-30 – 2020-11-02 (×3): 100 mg via INTRAVENOUS
  Filled 2020-10-29 (×4): qty 20

## 2020-10-29 MED ORDER — ASCORBIC ACID 500 MG PO TABS
500.0000 mg | ORAL_TABLET | Freq: Every day | ORAL | Status: DC
  Administered 2020-10-29 – 2020-11-02 (×5): 500 mg via ORAL
  Filled 2020-10-29 (×5): qty 1

## 2020-10-29 MED ORDER — INSULIN ASPART 100 UNIT/ML ~~LOC~~ SOLN
0.0000 [IU] | Freq: Three times a day (TID) | SUBCUTANEOUS | Status: DC
  Administered 2020-10-31 (×3): 2 [IU] via SUBCUTANEOUS
  Administered 2020-11-01: 5 [IU] via SUBCUTANEOUS
  Administered 2020-11-01 (×2): 3 [IU] via SUBCUTANEOUS
  Administered 2020-11-02: 08:00:00 2 [IU] via SUBCUTANEOUS
  Filled 2020-10-29 (×7): qty 1

## 2020-10-29 MED ORDER — INSULIN ASPART 100 UNIT/ML ~~LOC~~ SOLN
0.0000 [IU] | Freq: Every day | SUBCUTANEOUS | Status: DC
  Administered 2020-11-01: 21:00:00 3 [IU] via SUBCUTANEOUS
  Filled 2020-10-29 (×4): qty 1

## 2020-10-29 MED ORDER — SODIUM CHLORIDE 0.9 % IV BOLUS
500.0000 mL | Freq: Once | INTRAVENOUS | Status: AC
  Administered 2020-10-29: 500 mL via INTRAVENOUS

## 2020-10-29 MED ORDER — SODIUM CHLORIDE 0.9 % IV SOLN: INTRAVENOUS | Status: DC

## 2020-10-29 MED ORDER — LACTATED RINGERS IV BOLUS: 500.0000 mL | Freq: Once | INTRAVENOUS | Status: DC

## 2020-10-29 MED ORDER — IOHEXOL 300 MG/ML  SOLN
75.0000 mL | Freq: Once | INTRAMUSCULAR | Status: AC | PRN
  Administered 2020-10-29: 75 mL via INTRAVENOUS

## 2020-10-29 MED ORDER — ENOXAPARIN SODIUM 40 MG/0.4ML ~~LOC~~ SOLN
40.0000 mg | SUBCUTANEOUS | Status: DC
  Administered 2020-10-29 – 2020-10-30 (×2): 40 mg via SUBCUTANEOUS
  Filled 2020-10-29 (×2): qty 0.4

## 2020-10-29 MED ORDER — ONDANSETRON HCL 4 MG/2ML IJ SOLN
4.0000 mg | Freq: Three times a day (TID) | INTRAMUSCULAR | Status: DC | PRN
  Administered 2020-10-30: 10:00:00 4 mg via INTRAVENOUS
  Filled 2020-10-29: qty 2

## 2020-10-29 MED ORDER — LOPERAMIDE HCL 2 MG PO CAPS
2.0000 mg | ORAL_CAPSULE | Freq: Two times a day (BID) | ORAL | Status: DC | PRN
  Administered 2020-10-29 – 2020-10-30 (×2): 2 mg via ORAL
  Filled 2020-10-29 (×3): qty 1

## 2020-10-29 MED ORDER — POTASSIUM CHLORIDE CRYS ER 20 MEQ PO TBCR
40.0000 meq | EXTENDED_RELEASE_TABLET | Freq: Once | ORAL | Status: AC
  Administered 2020-10-29: 40 meq via ORAL
  Filled 2020-10-29: qty 2

## 2020-10-29 MED ORDER — ALBUTEROL SULFATE HFA 108 (90 BASE) MCG/ACT IN AERS
2.0000 | INHALATION_SPRAY | RESPIRATORY_TRACT | Status: DC | PRN
  Filled 2020-10-29: qty 6.7

## 2020-10-29 MED ORDER — IOHEXOL 300 MG/ML  SOLN: 100.0000 mL | Freq: Once | INTRAMUSCULAR | Status: DC | PRN

## 2020-10-29 MED ORDER — LACTATED RINGERS IV BOLUS
500.0000 mL | Freq: Once | INTRAVENOUS | Status: AC
  Administered 2020-10-29: 500 mL via INTRAVENOUS

## 2020-10-29 MED ORDER — DM-GUAIFENESIN ER 30-600 MG PO TB12: 1.0000 | ORAL_TABLET | Freq: Two times a day (BID) | ORAL | Status: DC | PRN

## 2020-10-29 MED ORDER — ZINC SULFATE 220 (50 ZN) MG PO CAPS
220.0000 mg | ORAL_CAPSULE | Freq: Every day | ORAL | Status: DC
  Administered 2020-10-29 – 2020-11-02 (×5): 220 mg via ORAL
  Filled 2020-10-29 (×5): qty 1

## 2020-10-29 MED ORDER — ACETAMINOPHEN 325 MG PO TABS
650.0000 mg | ORAL_TABLET | Freq: Four times a day (QID) | ORAL | Status: DC | PRN
  Administered 2020-10-29 – 2020-11-01 (×4): 650 mg via ORAL
  Filled 2020-10-29 (×4): qty 2

## 2020-10-29 MED ORDER — HYDRALAZINE HCL 20 MG/ML IJ SOLN
5.0000 mg | INTRAMUSCULAR | Status: DC | PRN
  Administered 2020-10-30: 5 mg via INTRAVENOUS
  Filled 2020-10-29 (×2): qty 1

## 2020-10-29 MED ORDER — SODIUM CHLORIDE 0.9 % IV SOLN
200.0000 mg | Freq: Once | INTRAVENOUS | Status: AC
  Administered 2020-10-29: 200 mg via INTRAVENOUS
  Filled 2020-10-29: qty 200

## 2020-10-29 NOTE — ED Notes (Signed)
[employer] used for further information. Pt unable to understand interpreter due to different dialect needed.

## 2020-10-29 NOTE — Progress Notes (Signed)
Remdesivir - Pharmacy Brief Note   O:  CXR: multifocal pneumonia SpO2: 94% on RA   A/P:  Remdesivir 200 mg IVPB once followed by 100 mg IVPB daily x 4 days.   Clovia Cuff, PharmD, BCPS 10/29/2020 6:09 PM

## 2020-10-29 NOTE — H&P (Signed)
History and Physical    Gabriela Robles YQI:[id number] DOB: 08-Sep-1929 DOA: 10/29/2020  Referring MD/NP/PA:   PCP: Olin Hauser, DO   Patient coming from:  The patient is coming from home.  At baseline, pt is partially dependent for most of ADL.        Chief Complaint: Nausea, vomiting, diarrhea  HPI: Gabriela Robles is a 87 y.o. female with medical history significant of hypertension, hyperlipidemia, diabetes mellitus, stroke, anemia, gout, who presents with nausea, vomiting, diarrhea.  Patient speaks [place]. Per report, [employer] was used for collecting medical history, unfortunately pt was unable to understand interpreter due to different dialect. His son is at bedside who speaks some [place]. Per his son, patient has been sick for more than 1 week.  Symptoms that include nausea, vomiting, multiple diarrhea, generalized weakness, poor appetite, decreased oral intake.  Patient has mild dry cough, no chest pain, shortness breath, fever or chills.  No symptoms of UTI.  ED Course: pt was found to have positive Covid Ag test, WBC 5.4, troponin level 45, 47, pending urinalysis, creatinine 1.01, BUN 21, potassium 3.4, temperature normal, blood pressure 117/40, heart rate 64, RR 16, oxygen saturation 94% on room air.  Patient is placed on MedSurg bed for position.  CXRL: Cardiac enlargement. Diffuse reticulonodular interstitial infiltrates throughout the lungs. Small amount of fluid inthe minor fissure.  CT-abdomen/pelvis: 1. Bilateral patchy airspace opacities, concerning for multifocal pneumonia. 2. Aortic atherosclerosis. 3. No acute abnormality seen in the abdomen or pelvis  Review of Systems:   General: no fevers, chills, no body weight gain, has poor appetite, has fatigue HEENT: no blurry vision, hearing changes or sore throat Respiratory: no dyspnea, mild coughing, no wheezing CV: no chest pain, no palpitations GI: has nausea, vomiting, diarrhea, no  constipation, abdominal pain,  GU: no dysuria, burning on urination, increased urinary frequency, hematuria  Ext: no leg edema Neuro: no unilateral weakness, numbness, or tingling, no vision change or hearing loss Skin: no rash, no skin tear. MSK: No muscle spasm, no deformity, no limitation of range of movement in spin Heme: No easy bruising.  Travel history: No recent long distant travel.  Allergy: No Known Allergies  Past Medical History:  Diagnosis Date  . Acute ischemic left MCA stroke ([place])   . Arthritis   . Diabetes mellitus without complication ([place])   . History of stroke 2017  . Hyperlipidemia   . Hypertension     Past Surgical History:  Procedure Laterality Date  . LIVER SURGERY     per her son    Social History:  reports that she has never smoked. She has never used smokeless tobacco. She reports that she does not drink alcohol and does not use drugs.  Family History:  Family History  Problem Relation Age of Onset  . Cancer Daughter        bone cancer     Prior to Admission medications   Medication Sig Start Date End Date Taking? Authorizing Provider  [facility] ASPIRIN ADULT LOW DOSE 81 MG chewable tablet Chew 81 mg by mouth daily. 09/14/15   [provider]  gabapentin (NEURONTIN) 100 MG capsule Take by mouth. 01/13/20   [provider]  Iron-Vitamin C (VITRON-C) 65-125 MG TABS Take 1 tablet by mouth daily. 05/12/20   Earlie Server, MD  lisinopril (ZESTRIL) 10 MG tablet TAKE 1 TABLET BY MOUTH EVERY DAY 09/28/20   Parks Ranger, Devonne Doughty, DO  metFORMIN (GLUCOPHAGE-XR) 500 MG 24 hr tablet Take 500  mg by mouth 2 (two) times daily with a meal. 03/01/20   [provider]  MULTIPLE VITAMINS-MINERALS ER PO Take by mouth.    [provider]  Omega-3 Fatty Acids (FISH OIL) 1000 MG CPDR Take by mouth.    [provider]  traMADol (ULTRAM) 50 MG tablet Take 1-2 tablets (50-100 mg total) by mouth 2 (two) times daily as needed. 05/02/20    Smitty Cords, DO    Physical Exam: Vitals:   10/29/20 1445 10/29/20 1500 10/29/20 1515 10/29/20 1530  BP:  (!) 127/103  (!) 133/50  Pulse: (!) 59  60   Resp:      Temp:      TempSrc:      SpO2: 95%  96%    General: Not in acute distress.  Dry mucosal membrane HEENT:       Eyes: PERRL, EOMI, no scleral icterus.       ENT: No discharge from the ears and nose, no pharynx injection, no tonsillar enlargement.        Neck: No JVD, no bruit, no mass felt. Heme: No neck lymph node enlargement. Cardiac: S1/S2, RRR, No murmurs, No gallops or rubs. Respiratory: No rales, wheezing, rhonchi or rubs. GI: Soft, nondistended, nontender, no rebound pain, no organomegaly, BS present. GU: No hematuria Ext: No pitting leg edema bilaterally. 1+DP/PT pulse bilaterally. Musculoskeletal: No joint deformities, No joint redness or warmth, no limitation of ROM in spin. Skin: No rashes.  Neuro: Alert, oriented X3, cranial nerves II-XII grossly intact, moves all extremities normally.  Psych: Patient is not psychotic, no suicidal or hemocidal ideation.  Labs on Admission: I have personally reviewed following labs and imaging studies  CBC: Recent Labs  Lab 10/29/20 0103  WBC 5.4  HGB 10.1*  HCT 32.3*  MCV 61.9*  PLT 177   Basic Metabolic Panel: Recent Labs  Lab 10/29/20 0103  NA 130*  K 3.4*  CL 97*  CO2 21*  GLUCOSE 131*  BUN 21  CREATININE 1.01*  CALCIUM 8.4*   GFR: CrCl cannot be calculated (Unknown ideal weight.). Liver Function Tests: Recent Labs  Lab 10/29/20 0541  AST 26  ALT 14  ALKPHOS 53  BILITOT 0.6  PROT 7.0  ALBUMIN 3.2*   No results for input(s): LIPASE, AMYLASE in the last 168 hours. No results for input(s): AMMONIA in the last 168 hours. Coagulation Profile: No results for input(s): INR, PROTIME in the last 168 hours. Cardiac Enzymes: No results for input(s): CKTOTAL, CKMB, CKMBINDEX, TROPONINI in the last 168 hours. BNP (last 3 results) No  results for input(s): PROBNP in the last 8760 hours. HbA1C: No results for input(s): HGBA1C in the last 72 hours. CBG: No results for input(s): GLUCAP in the last 168 hours. Lipid Profile: Recent Labs    10/29/20 0541  TRIG 127   Thyroid Function Tests: No results for input(s): TSH, T4TOTAL, FREET4, T3FREE, THYROIDAB in the last 72 hours. Anemia Panel: No results for input(s): VITAMINB12, FOLATE, FERRITIN, TIBC, IRON, RETICCTPCT in the last 72 hours. Urine analysis:    Component Value Date/Time   COLORURINE YELLOW (A) 10/29/2020 1652   APPEARANCEUR CLOUDY (A) 10/29/2020 1652   LABSPEC 1.028 10/29/2020 1652   PHURINE 5.0 10/29/2020 1652   GLUCOSEU NEGATIVE 10/29/2020 1652   HGBUR LARGE (A) 10/29/2020 1652   BILIRUBINUR NEGATIVE 10/29/2020 1652   KETONESUR 5 (A) 10/29/2020 1652   PROTEINUR NEGATIVE 10/29/2020 1652   NITRITE POSITIVE (A) 10/29/2020 1652   LEUKOCYTESUR LARGE (A)  10/29/2020 1652   Sepsis Labs: @LABRCNTIP (procalcitonin:4,lacticidven:4) )No results found for this or any previous visit (from the past 240 hour(s)).   Radiological Exams on Admission: DG Chest 2 View  Result Date: 10/29/2020 CLINICAL DATA:  Increased weakness, nausea, vomiting, and diarrhea for 1 week. EXAM: CHEST - 2 VIEW COMPARISON:  None. FINDINGS: Shallow inspiration. Cardiac enlargement. Small amount of fluid in the right minor fissure. Diffuse reticulonodular interstitial infiltrates throughout the lungs. This is likely edema but multifocal pneumonia would also be a consideration. No pneumothorax. Calcified and tortuous aorta. Degenerative changes in the spine. IMPRESSION: Cardiac enlargement. Diffuse reticulonodular interstitial infiltrates throughout the lungs likely edema but multifocal pneumonia would also be a consideration. Small amount of fluid in the minor fissure. Electronically Signed   By: 12/27/2020 M.D.   On: 10/29/2020 16:03   CT ABDOMEN PELVIS W CONTRAST  Result Date:  10/29/2020 CLINICAL DATA:  Weakness. EXAM: CT ABDOMEN AND PELVIS WITH CONTRAST TECHNIQUE: Multidetector CT imaging of the abdomen and pelvis was performed using the standard protocol following bolus administration of intravenous contrast. CONTRAST:  86mL OMNIPAQUE IOHEXOL 300 MG/ML  SOLN COMPARISON:  None. FINDINGS: Lower chest: Bilateral patchy airspace opacities are noted concerning for multifocal pneumonia. Hepatobiliary: No focal liver abnormality is seen. Status post cholecystectomy. No biliary dilatation. Pancreas: Unremarkable. No pancreatic ductal dilatation or surrounding inflammatory changes. Spleen: Normal in size without focal abnormality. Adrenals/Urinary Tract: Adrenal glands are unremarkable. Kidneys are normal, without renal calculi, focal lesion, or hydronephrosis. Bladder is unremarkable. Stomach/Bowel: Stomach is within normal limits. Appendix appears normal. No evidence of bowel wall thickening, distention, or inflammatory changes. Vascular/Lymphatic: Aortic atherosclerosis. No enlarged abdominal or pelvic lymph nodes. Reproductive: Uterus and bilateral adnexa are unremarkable. Other: No abdominal wall hernia or abnormality. No abdominopelvic ascites. Musculoskeletal: No acute or significant osseous findings. IMPRESSION: 1. Bilateral patchy airspace opacities are noted concerning for multifocal pneumonia. 2. Aortic atherosclerosis. 3. No acute abnormality seen in the abdomen or pelvis. Aortic Atherosclerosis (ICD10-I70.0). Electronically Signed   By: 74m M.D.   On: 10/29/2020 16:01     EKG: I have personally reviewed.  Sinus rhythm, QTC 467, right bundle blockade, early R wave progression  Assessment/Plan Principal Problem:   Gastroenteritis due to COVID-19 virus Active Problems:   Iron deficiency anemia   Essential (primary) hypertension   Type 2 diabetes mellitus, uncontrolled, with neuropathy (HCC)   History of cerebrovascular accident (CVA) with residual deficit    Nausea vomiting and diarrhea   Elevated troponin   Hypokalemia   Gastroenteritis due to COVID-19 virus: Patient has positive Covid test.  Her major presentation is gastroenteritis including nausea vomiting, diarrhea.  CT abdomen/pelvis negative for acute intra-abdominal abnormalities, but showed multifocal patchy infiltration bilaterally. Patient only has mild dry cough, no shortness breath, no oxygen desaturation.  Current oxygen saturation 94% on room air.  Patient does not have fever or leukocytosis, I have low suspicions for C. difficile colitis.  -Placed on MedSurg bed for position -Started remdesivir -Will not give Solu-Medrol since patient does not have oxygen desaturation -As needed Zofran -As needed Imodium -IV fluid: 500 cc normal saline, followed by 75 cc/h -Check inflammation markers related to COVID-19  Iron deficiency anemia: Hemoglobin stable, 10.1 (9.3 on 05/12/2020) -Continue iron supplement  Essential (primary) hypertension: Blood pressure 117/40 -Hold lisinopril since blood pressure is normal and patient is clinically dehydrated -IV hydralazine as needed  Type 2 diabetes mellitus, uncontrolled, with neuropathy (HCC): Recent A1c 9.3, poorly controlled.  Patient is  taking Metformin at home -Sliding scale insulin  History of cerebrovascular accident (CVA) with residual deficit -Continue aspirin  Elevated troponin: Troponin 45, 47, no chest pain, most likely due to demand ischemia -Continue aspirin -Trend troponin -Check A1c, FLP  Hypokalemia: K= 3.4 on admission. - Repleted - Check Mg level    DVT ppx: SQ Lovenox Code Status: Full code Family Communication:   Yes, patient's  son  at bed side Disposition Plan:  Anticipate discharge back to previous environment Consults called:  none Admission status: Med-surg bed for obs   Status is: Observation  The patient remains OBS appropriate and will d/c before 2 midnights.  Dispo: The patient is from: Home               Anticipated d/c is to: Home              Anticipated d/c date is: 1 day              Patient currently is not medically stable to d/c.            Date of Service 10/29/2020    Lorretta Harp [facility]   If 7PM-7AM, please contact night-coverage [email or web address] 10/29/2020, 5:48 PM

## 2020-10-29 NOTE — ED Triage Notes (Signed)
Pt arrived via EMS from home where she lives with family who repots that pt has had increased weakness with N/V/D x1 week. Pt speaks [name]. Pt has previous stroke deficients affecting speech and mobility.

## 2020-10-29 NOTE — ED Provider Notes (Signed)
I seen care of this patient approximately 1500.  Please document for his note for full details regarding patient's initial evaluation assessment.  In brief patient presents with a past medical history of CVA, DM, HTN, HDL and anemia who presents for assessment of approximately 4 days of generalized malaise and nausea as well as nonbloody nonbilious vomiting.  EKG is reassuring and tropes are stable x2 suspicion for ACS or acute myocarditis.  Covid is positive.  BMP remarkable for mild hypokalemia otherwise no significant ocular metabolic derangements.  Kidney function is at baseline.  CBC shows an anemia with hemoglobin at baseline at 10.1 compared to 9.35 months ago.  No leukocytosis or abnormal platelets.  Covid is positive.  On my assessment patient is very dry and does not wish to drink anything this time.  Will admit to observation with hospitalist service given concern for dehydration elderly patient of Covid.   Gilles Chiquito, MD 10/29/20 2139

## 2020-10-29 NOTE — ED Notes (Signed)
CBG 136 

## 2020-10-29 NOTE — ED Provider Notes (Signed)
[facility] Emergency Department Provider Note    Event Date/Time   First MD Initiated Contact with Patient 10/29/20 1342     (approximate)  I have reviewed the triage vital signs and the nursing notes.   HISTORY  Chief Complaint Weakness  History limited 2/2 language barrier   HPI Gabriela Robles is a 87 y.o. female the below listed past medical history presents to the ER for evaluation of generalized malaise achiness nausea and vomiting for the past 4 days.  No measured fevers.  Denies any pain at this time is not been able to keep anything down for the past several days.  Feels weak and dehydrated.    Past Medical History:  Diagnosis Date  . Acute ischemic left MCA stroke ([place])   . Arthritis   . Diabetes mellitus without complication ([place])   . History of stroke 2017  . Hyperlipidemia   . Hypertension    Family History  Problem Relation Age of Onset  . Cancer Daughter        bone cancer   History reviewed. No pertinent surgical history. Patient Active Problem List   Diagnosis Date Noted  . Microcytic anemia 01/25/2020  . Elevated ferritin 01/25/2020  . Transaminitis 01/25/2020  . Abnormal liver ultrasound 01/25/2020  . Chronic gout of multiple sites 09/28/2019  . Weakness of right upper extremity 04/03/2018  . 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 Diabetic peripheral neuropathy ([place]) 11/20/2015  . History of cerebrovascular accident (CVA) with residual deficit 10/31/2015  . Essential (primary) hypertension 09/14/2015  . HLD (hyperlipidemia) 09/14/2015  . Type 2 diabetes mellitus, uncontrolled, with neuropathy ([place]) 09/14/2015  . History of arterial ischemic stroke 09/12/2015  . Anemia 07/24/2015  . Microcytic erythrocytes 04/17/2015  . Arthritis 04/15/2015  . Weight loss 04/15/2015      Prior to Admission medications   Medication Sig Start Date End Date Taking? Authorizing Provider  [facility] ASPIRIN ADULT LOW DOSE 81 MG chewable  tablet Chew 81 mg by mouth daily. 09/14/15   [provider]  gabapentin (NEURONTIN) 100 MG capsule Take by mouth. 01/13/20   [provider]  Iron-Vitamin C (VITRON-C) 65-125 MG TABS Take 1 tablet by mouth daily. 05/12/20   Earlie Server, MD  lisinopril (ZESTRIL) 10 MG tablet TAKE 1 TABLET BY MOUTH EVERY DAY 09/28/20   Parks Ranger, Devonne Doughty, DO  metFORMIN (GLUCOPHAGE-XR) 500 MG 24 hr tablet Take 500 mg by mouth 2 (two) times daily with a meal. 03/01/20   [provider]  MULTIPLE VITAMINS-MINERALS ER PO Take by mouth.    [provider]  Omega-3 Fatty Acids (FISH OIL) 1000 MG CPDR Take by mouth.    [provider]  traMADol (ULTRAM) 50 MG tablet Take 1-2 tablets (50-100 mg total) by mouth 2 (two) times daily as needed. 05/02/20   Olin Hauser, DO    Allergies Patient has no known allergies.    Social History Social History   Tobacco Use  . Smoking status: Never Smoker  . Smokeless tobacco: Never Used  Vaping Use  . Vaping Use: Never used  Substance Use Topics  . Alcohol use: No    Alcohol/week: 0.0 standard drinks  . Drug use: No    Review of Systems Patient denies headaches, rhinorrhea, blurry vision, numbness, shortness of breath, chest pain, edema, cough, abdominal pain, nausea, vomiting, diarrhea, dysuria, fevers, rashes or hallucinations unless otherwise stated above in HPI. ____________________________________________   PHYSICAL EXAM:  VITAL SIGNS: Vitals:   10/29/20  9798 10/29/20 1222  BP: (!) 112/100 (!) 117/40  Pulse: 64 64  Resp: 16 16  Temp: 98.4 F (36.9 C)   SpO2: 93% 94%    Constitutional: Alert, ill appearing Eyes: Conjunctivae are normal.  Head: Atraumatic. Nose: No congestion/rhinnorhea. Mouth/Throat: Mucous membranes are moist.   Neck: No stridor. Painless ROM.  Cardiovascular: Normal rate, regular rhythm. Grossly normal heart sounds.  Good peripheral circulation. Respiratory: Normal respiratory  effort.  No retractions. Lungs CTAB. Gastrointestinal: Soft and nontender. No distention. No abdominal bruits. No CVA tenderness. Genitourinary: deferred Musculoskeletal: No lower extremity tenderness nor edema.  No joint effusions. Neurologic:  Normal speech and language. No gross focal neurologic deficits are appreciated. No facial droop Skin:  Skin is warm, dry and intact. No rash noted. Psychiatric: Mood and affect are normal. Speech and behavior are normal.  ____________________________________________   LABS (all labs ordered are listed, but only abnormal results are displayed)  Results for orders placed or performed during the hospital encounter of 10/29/20 (from the past 24 hour(s))  Basic metabolic panel     Status: Abnormal   Collection Time: 10/29/20  1:03 AM  Result Value Ref Range   Sodium 130 (L) 135 - 145 mmol/L   Potassium 3.4 (L) 3.5 - 5.1 mmol/L   Chloride 97 (L) 98 - 111 mmol/L   CO2 21 (L) 22 - 32 mmol/L   Glucose, Bld 131 (H) 70 - 99 mg/dL   BUN 21 8 - 23 mg/dL   Creatinine, Ser 1.01 (H) 0.44 - 1.00 mg/dL   Calcium 8.4 (L) 8.9 - 10.3 mg/dL   GFR, Estimated 53 (L) >60 mL/min   Anion gap 12 5 - 15  CBC     Status: Abnormal   Collection Time: 10/29/20  1:03 AM  Result Value Ref Range   WBC 5.4 4.0 - 10.5 K/uL   RBC 5.22 (H) 3.87 - 5.11 MIL/uL   Hemoglobin 10.1 (L) 12.0 - 15.0 g/dL   HCT 32.3 (L) 36.0 - 46.0 %   MCV 61.9 (L) 80.0 - 100.0 fL   MCH 19.3 (L) 26.0 - 34.0 pg   MCHC 31.3 30.0 - 36.0 g/dL   RDW 15.9 (H) 11.5 - 15.5 %   Platelets 177 150 - 400 K/uL   nRBC 0.0 0.0 - 0.2 %  Troponin I (High Sensitivity)     Status: Abnormal   Collection Time: 10/29/20  1:03 AM  Result Value Ref Range   Troponin I (High Sensitivity) 45 (H) <18 ng/L  Troponin I (High Sensitivity)     Status: Abnormal   Collection Time: 10/29/20  5:41 AM  Result Value Ref Range   Troponin I (High Sensitivity) 47 (H) <18 ng/L  POC SARS Coronavirus 2 Ag-ED - Nasal Swab (BD Veritor  Kit)     Status: Abnormal   Collection Time: 10/29/20  3:11 PM  Result Value Ref Range   SARS Coronavirus 2 Ag Positive (A) Negative  POC SARS Coronavirus 2 Ag-ED -     Status: Abnormal   Collection Time: 10/29/20  3:11 PM  Result Value Ref Range   SARS Coronavirus 2 Ag POSITIVE (A) NEGATIVE   ____________________________________________  EKG My review and personal interpretation at Time: 1:05   Indication: weakness  Rate: 65  Rhythm: sinus Axis: normal Other: normal intervals, nonspecific st abn ____________________________________________  RADIOLOGY cxr and ct abd/pelv pending   PROCEDURES  Procedure(s) performed:  Procedures    Critical Care performed: no ____________________________________________   INITIAL IMPRESSION /  ASSESSMENT AND PLAN / ED COURSE  Pertinent labs & imaging results that were available during my care of the patient were reviewed by me and considered in my medical decision making (see chart for details).   DDX: Dehydration, sepsis, pna, uti, hypoglycemia, cva, drug effect, withdrawal, encephalitis   Tyana Lins is a 87 y.o. who presents to the ED with presentation as described above.  Patient frail elderly and ill-appearing.  She is afebrile hemodynamically stable.  Does appear dehydrated will give IV fluids.  Blood work does show evidence of mild dehydration and hyponatremia.  Will give antiemetic.  Given her persistent nausea vomiting CT abdomen pelvis will be ordered to rule out colitis or obstructive pathology.  Will order chest x-ray.  Do of suspicion for Covid therefore will evaluate with Covid test.  Patient be signed out to oncoming physician pending reassessment.     The patient was evaluated in Emergency Department today for the symptoms described in the history of present illness. He/she was evaluated in the context of the global COVID-19 pandemic, which necessitated consideration that the patient might be at risk for infection with  the SARS-CoV-2 virus that causes COVID-19. Institutional protocols and algorithms that pertain to the evaluation of patients at risk for COVID-19 are in a state of rapid change based on information released by regulatory bodies including the CDC and federal and state organizations. These policies and algorithms were followed during the patient's care in the ED.  As part of my medical decision making, I reviewed the following data within the [facility] notes reviewed and incorporated, Labs reviewed, notes from prior ED visits and [place] Controlled Substance Database   ____________________________________________   FINAL CLINICAL IMPRESSION(S) / ED DIAGNOSES  Final diagnoses:  Intractable vomiting with nausea, unspecified vomiting type  COVID-19      NEW MEDICATIONS STARTED DURING THIS VISIT:  New Prescriptions   No medications on file     Note:  This document was prepared using Dragon voice recognition software and may include unintentional dictation errors.    Merlyn Lot, MD 10/29/20 [phone]

## 2020-10-29 NOTE — ED Notes (Signed)
Pts daughter reports pt has had decreased intake over last week and when she does eat, she is unable to keep food down or in due to emesis and diarrhea.

## 2020-10-30 ENCOUNTER — Other Ambulatory Visit: Payer: Self-pay

## 2020-10-30 DIAGNOSIS — Z7982 Long term (current) use of aspirin: Secondary | ICD-10-CM | POA: Diagnosis not present

## 2020-10-30 DIAGNOSIS — E1165 Type 2 diabetes mellitus with hyperglycemia: Secondary | ICD-10-CM | POA: Diagnosis present

## 2020-10-30 DIAGNOSIS — K219 Gastro-esophageal reflux disease without esophagitis: Secondary | ICD-10-CM | POA: Diagnosis present

## 2020-10-30 DIAGNOSIS — I700 Atherosclerosis of aorta: Secondary | ICD-10-CM | POA: Diagnosis present

## 2020-10-30 DIAGNOSIS — Z79899 Other long term (current) drug therapy: Secondary | ICD-10-CM | POA: Diagnosis not present

## 2020-10-30 DIAGNOSIS — D649 Anemia, unspecified: Secondary | ICD-10-CM | POA: Diagnosis present

## 2020-10-30 DIAGNOSIS — I119 Hypertensive heart disease without heart failure: Secondary | ICD-10-CM | POA: Diagnosis present

## 2020-10-30 DIAGNOSIS — Z7984 Long term (current) use of oral hypoglycemic drugs: Secondary | ICD-10-CM | POA: Diagnosis not present

## 2020-10-30 DIAGNOSIS — E785 Hyperlipidemia, unspecified: Secondary | ICD-10-CM | POA: Diagnosis present

## 2020-10-30 DIAGNOSIS — E860 Dehydration: Secondary | ICD-10-CM | POA: Diagnosis present

## 2020-10-30 DIAGNOSIS — R778 Other specified abnormalities of plasma proteins: Secondary | ICD-10-CM | POA: Diagnosis not present

## 2020-10-30 DIAGNOSIS — Z9049 Acquired absence of other specified parts of digestive tract: Secondary | ICD-10-CM | POA: Diagnosis not present

## 2020-10-30 DIAGNOSIS — I7090 Unspecified atherosclerosis: Secondary | ICD-10-CM | POA: Diagnosis not present

## 2020-10-30 DIAGNOSIS — J1282 Pneumonia due to coronavirus disease 2019: Secondary | ICD-10-CM | POA: Diagnosis present

## 2020-10-30 DIAGNOSIS — E1140 Type 2 diabetes mellitus with diabetic neuropathy, unspecified: Secondary | ICD-10-CM | POA: Diagnosis present

## 2020-10-30 DIAGNOSIS — D509 Iron deficiency anemia, unspecified: Secondary | ICD-10-CM | POA: Diagnosis present

## 2020-10-30 DIAGNOSIS — D508 Other iron deficiency anemias: Secondary | ICD-10-CM | POA: Diagnosis not present

## 2020-10-30 DIAGNOSIS — U071 COVID-19: Secondary | ICD-10-CM | POA: Diagnosis present

## 2020-10-30 DIAGNOSIS — I6930 Unspecified sequelae of cerebral infarction: Secondary | ICD-10-CM | POA: Diagnosis not present

## 2020-10-30 DIAGNOSIS — I248 Other forms of acute ischemic heart disease: Secondary | ICD-10-CM | POA: Diagnosis present

## 2020-10-30 DIAGNOSIS — Z809 Family history of malignant neoplasm, unspecified: Secondary | ICD-10-CM | POA: Diagnosis not present

## 2020-10-30 DIAGNOSIS — E876 Hypokalemia: Secondary | ICD-10-CM | POA: Diagnosis present

## 2020-10-30 DIAGNOSIS — J9601 Acute respiratory failure with hypoxia: Secondary | ICD-10-CM | POA: Diagnosis present

## 2020-10-30 DIAGNOSIS — I10 Essential (primary) hypertension: Secondary | ICD-10-CM | POA: Diagnosis not present

## 2020-10-30 DIAGNOSIS — I4510 Unspecified right bundle-branch block: Secondary | ICD-10-CM | POA: Diagnosis present

## 2020-10-30 DIAGNOSIS — A0839 Other viral enteritis: Secondary | ICD-10-CM | POA: Diagnosis present

## 2020-10-30 LAB — LIPID PANEL
Cholesterol: 149 mg/dL (ref 0–200)
HDL: 36 mg/dL — ABNORMAL LOW (ref >40)
LDL Cholesterol: 97 mg/dL (ref 0–99)
Total CHOL/HDL Ratio: 4.1 RATIO
Triglycerides: 81 mg/dL (ref <150)
VLDL: 16 mg/dL (ref 0–40)

## 2020-10-30 LAB — CBC WITH DIFFERENTIAL/PLATELET
Abs Immature Granulocytes: 0.03 10*3/uL (ref 0.00–0.07)
Basophils Absolute: 0 10*3/uL (ref 0.0–0.1)
Basophils Relative: 0 %
Eosinophils Absolute: 0 10*3/uL (ref 0.0–0.5)
Eosinophils Relative: 0 %
HCT: 28.7 % — ABNORMAL LOW (ref 36.0–46.0)
Hemoglobin: 9.2 g/dL — ABNORMAL LOW (ref 12.0–15.0)
Immature Granulocytes: 1 %
Lymphocytes Relative: 45 %
Lymphs Abs: 2 10*3/uL (ref 0.7–4.0)
MCH: 19.8 pg — ABNORMAL LOW (ref 26.0–34.0)
MCHC: 32.1 g/dL (ref 30.0–36.0)
MCV: 61.7 fL — ABNORMAL LOW (ref 80.0–100.0)
Monocytes Absolute: 0.3 10*3/uL (ref 0.1–1.0)
Monocytes Relative: 6 %
Neutro Abs: 2.1 10*3/uL (ref 1.7–7.7)
Neutrophils Relative %: 48 %
Platelets: 178 10*3/uL (ref 150–400)
RBC: 4.65 MIL/uL (ref 3.87–5.11)
RDW: 16.3 % — ABNORMAL HIGH (ref 11.5–15.5)
WBC: 4.3 10*3/uL (ref 4.0–10.5)
nRBC: 0 % (ref 0.0–0.2)

## 2020-10-30 LAB — GLUCOSE, CAPILLARY
Glucose-Capillary: 89 mg/dL (ref 70–99)
Glucose-Capillary: 74 mg/dL (ref 70–99)
Glucose-Capillary: 88 mg/dL (ref 70–99)
Glucose-Capillary: 105 mg/dL — ABNORMAL HIGH (ref 70–99)

## 2020-10-30 LAB — COMPREHENSIVE METABOLIC PANEL
ALT: 32 U/L (ref 0–44)
AST: 119 U/L — ABNORMAL HIGH (ref 15–41)
Albumin: 2.7 g/dL — ABNORMAL LOW (ref 3.5–5.0)
Alkaline Phosphatase: 177 U/L — ABNORMAL HIGH (ref 38–126)
Anion gap: 8 (ref 5–15)
BUN: 23 mg/dL (ref 8–23)
CO2: 19 mmol/L — ABNORMAL LOW (ref 22–32)
Calcium: 7.8 mg/dL — ABNORMAL LOW (ref 8.9–10.3)
Chloride: 110 mmol/L (ref 98–111)
Creatinine, Ser: 0.84 mg/dL (ref 0.44–1.00)
GFR, Estimated: >60 mL/min (ref >60)
Glucose, Bld: 104 mg/dL — ABNORMAL HIGH (ref 70–99)
Potassium: 4.8 mmol/L (ref 3.5–5.1)
Sodium: 137 mmol/L (ref 135–145)
Total Bilirubin: 0.8 mg/dL (ref 0.3–1.2)
Total Protein: 6.2 g/dL — ABNORMAL LOW (ref 6.5–8.1)

## 2020-10-30 LAB — FERRITIN: Ferritin: 1409 ng/mL — ABNORMAL HIGH (ref 11–307)

## 2020-10-30 LAB — MAGNESIUM: Magnesium: 2 mg/dL (ref 1.7–2.4)

## 2020-10-30 LAB — FIBRIN DERIVATIVES D-DIMER (ARMC ONLY): Fibrin derivatives D-dimer (ARMC): 709.76 ng/mL (FEU) — ABNORMAL HIGH (ref 0.00–499.00)

## 2020-10-30 LAB — C-REACTIVE PROTEIN
CRP: 17.5 mg/dL — ABNORMAL HIGH (ref <1.0)
CRP: 17.4 mg/dL — ABNORMAL HIGH (ref <1.0)

## 2020-10-30 LAB — HEPATITIS B SURFACE ANTIGEN: Hepatitis B Surface Ag: NONREACTIVE

## 2020-10-30 LAB — HEMOGLOBIN A1C
Hgb A1c MFr Bld: 7 % — ABNORMAL HIGH (ref 4.8–5.6)
Mean Plasma Glucose: 154.2 mg/dL

## 2020-10-30 MED ORDER — ATORVASTATIN CALCIUM 20 MG PO TABS
20.0000 mg | ORAL_TABLET | Freq: Every day | ORAL | Status: DC
  Administered 2020-10-30 – 2020-11-02 (×4): 20 mg via ORAL
  Filled 2020-10-30 (×4): qty 1

## 2020-10-30 MED ORDER — GUAIFENESIN-DM 100-10 MG/5ML PO SYRP: 5.0000 mL | ORAL_SOLUTION | ORAL | Status: DC | PRN

## 2020-10-30 MED ORDER — OMEGA-3-ACID ETHYL ESTERS 1 G PO CAPS
1.0000 g | ORAL_CAPSULE | Freq: Two times a day (BID) | ORAL | Status: DC
  Administered 2020-10-30 – 2020-11-02 (×6): 1 g via ORAL
  Filled 2020-10-30 (×6): qty 1

## 2020-10-30 MED ORDER — SODIUM BICARBONATE 650 MG PO TABS
650.0000 mg | ORAL_TABLET | Freq: Three times a day (TID) | ORAL | Status: DC
Start: 2020-10-30 — End: 2020-10-31
  Administered 2020-10-30 – 2020-10-31 (×4): 650 mg via ORAL
  Filled 2020-10-30 (×5): qty 1

## 2020-10-30 MED ORDER — PANTOPRAZOLE SODIUM 40 MG PO TBEC
40.0000 mg | DELAYED_RELEASE_TABLET | Freq: Two times a day (BID) | ORAL | Status: DC
  Administered 2020-10-30 – 2020-11-02 (×6): 40 mg via ORAL
  Filled 2020-10-30 (×6): qty 1

## 2020-10-30 MED ORDER — LISINOPRIL 10 MG PO TABS
10.0000 mg | ORAL_TABLET | Freq: Every day | ORAL | Status: DC
  Administered 2020-10-30 – 2020-11-02 (×4): 10 mg via ORAL
  Filled 2020-10-30 (×4): qty 1

## 2020-10-30 MED ORDER — METHYLPREDNISOLONE SODIUM SUCC 40 MG IJ SOLR
40.0000 mg | Freq: Two times a day (BID) | INTRAMUSCULAR | Status: DC
  Administered 2020-10-30 – 2020-11-02 (×6): 40 mg via INTRAVENOUS
  Filled 2020-10-30 (×6): qty 1

## 2020-10-30 NOTE — Telephone Encounter (Signed)
Pt is still admitted, No need to schedule a followup visit as of right now.  Family can contact the office once she recovers per Dr.Yu

## 2020-10-30 NOTE — Evaluation (Signed)
Physical Therapy Evaluation Patient Details Name: Gabriela Robles MRN: [id number] DOB: 12-18-1928 Today's Date: 10/30/2020   History of Present Illness  Pt is a 87 y/o F with PMH: HTN, HLD, stroke, DM, anemia, and gout who presented to ED with nausea, vomiting, and diarrhea. Found to be COVID +.  Clinical Impression  Pt is a pleasant 87 year old female who was admitted for gastroenteritis secondary to covid. Pt performs bed mobility/transfers with min assist and ambulation with cga and RW. Communication barrier limiting further mobility at this time. Pt demonstrates deficits with strength/mobility/endurance. Per son, pt is close to baseline level, agreeable to [facility] services. Equipment delivered to room. Would benefit from skilled PT to address above deficits and promote optimal return to PLOF. Recommend transition to HHPT upon discharge from acute hospitalization.     Follow Up Recommendations Home health PT;Supervision/Assistance - 24 hour    Equipment Recommendations  Rolling walker with 5" wheels;3in1 (PT)    Recommendations for Other Services       Precautions / Restrictions Precautions Precautions: Fall Restrictions Weight Bearing Restrictions: No Other Position/Activity Restrictions: on O2      Mobility  Bed Mobility Overal bed mobility: Needs Assistance Bed Mobility: Supine to Sit;Sit to Supine     Supine to sit: Min assist Sit to supine: Min assist   General bed mobility comments: able to sit at EOB progressing to cga    Transfers Overall transfer level: Needs assistance Equipment used: Rolling walker (2 wheeled) Transfers: Sit to/from Stand Sit to Stand: Min assist         General transfer comment: safe technique and demonstrates forward flexed posture. Once standing, demonstrates incontinence episode in floor  Ambulation/Gait Ambulation/Gait assistance: Min guard Gait Distance (Feet): 3 Feet Assistive device: Rolling walker (2 wheeled) Gait  Pattern/deviations: Step-to pattern     General Gait Details: attempted to take steps to move away from wet floor due to incontience episode but unable to effectively communicate to keep patient safe with line management. Able to take several steps and cued for returning back to bed  Stairs            Wheelchair Mobility    Modified Rankin (Stroke Patients Only)       Balance Overall balance assessment: Needs assistance Sitting-balance support: Feet supported Sitting balance-Leahy Scale: Fair     Standing balance support: Bilateral upper extremity supported Standing balance-Leahy Scale: Fair                               Pertinent Vitals/Pain      Home Living Family/patient expects to be discharged to:: Private residence Living Arrangements: Children (lives with daughter) Available Help at Discharge: Family;Available PRN/intermittently Type of Home: House         Home Equipment: Walker - 2 wheels;Cane - single point Additional Comments: per chart review, requesting new AD    Prior Function Level of Independence: Needs assistance   Gait / Transfers Assistance Needed: Pt alternates RW and cane use per her son.  ADL's / Homemaking Assistance Needed: pt typically INDEP with BADLs, but her son reports she has recently required increased help in past 1-2 weeks d/t weakness. Dtr that pt lives with performs most IADLs.  Comments: Pt needs laotian interpreter, OT attempted to utilize AMN iPad to no avail as the dialect was incorrect. Pt's son is present throughout and speaks english moderately well and assists during assessment.  Hand Dominance        Extremity/Trunk Assessment   Upper Extremity Assessment Upper Extremity Assessment: Defer to OT evaluation    Lower Extremity Assessment Lower Extremity Assessment: Generalized weakness (B LE grossly 4/5 evidenced by functional mobility)       Communication   Communication: Prefers language  other than English (used son)  Cognition Arousal/Alertness: Awake/alert Behavior During Therapy: WFL for tasks assessed/performed;Flat affect Overall Cognitive Status: Difficult to assess                                 General Comments: very difficult to communicate with patient due to masking and isolation status.Very poor historian due to communication barrier. Unsure of accuracy      General Comments      Exercises Other Exercises Other Exercises: Pt with incontient episode during standing attempt. Assisted for hygiene and new gown/linen change   Assessment/Plan    PT Assessment Patient needs continued PT services  PT Problem List Decreased strength;Decreased balance;Decreased mobility       PT Treatment Interventions Gait training;Therapeutic exercise;Balance training    PT Goals (Current goals can be found in the Care Plan section)  Acute Rehab PT Goals Patient Stated Goal: to get stronger PT Goal Formulation: With patient Time For Goal Achievement: 11/13/20 Potential to Achieve Goals: Good    Frequency Min 2X/week   Barriers to discharge        Co-evaluation               AM-PAC PT "6 Clicks" Mobility  Outcome Measure Help needed turning from your back to your side while in a flat bed without using bedrails?: A Little Help needed moving from lying on your back to sitting on the side of a flat bed without using bedrails?: A Little Help needed moving to and from a bed to a chair (including a wheelchair)?: A Little Help needed standing up from a chair using your arms (e.g., wheelchair or bedside chair)?: A Little Help needed to walk in hospital room?: A Lot Help needed climbing 3-5 steps with a railing? : A Lot 6 Click Score: 16    End of Session Equipment Utilized During Treatment: Oxygen Activity Tolerance: Patient tolerated treatment well Patient left: in bed;with bed alarm set Nurse Communication: Mobility status PT Visit Diagnosis:  Unsteadiness on feet (R26.81);Muscle weakness (generalized) (M62.81);Difficulty in walking, not elsewhere classified (R26.2)    Time: 1829-9371 PT Time Calculation (min) (ACUTE ONLY): 24 min   Charges:   PT Evaluation $PT Eval Low Complexity: 1 Low PT Treatments $Therapeutic Activity: 8-22 mins        Elizabeth Palau, PT, DPT [phone]   Tarquin Welcher 10/30/2020, 5:02 PM

## 2020-10-30 NOTE — Evaluation (Signed)
Occupational Therapy Evaluation Patient Details Name: Gabriela Robles MRN: [id number] DOB: 07/31/1929 Today's Date: 10/30/2020    History of Present Illness Pt is a 87 y/o F with PMH: HTN, HLD, stroke, DM, anemia, and gout who presented to ED with nausea, vomiting, and diarrhea. Found to be COVID +.   Clinical Impression   Pt seen for OT evaluation this date in setting of acute hospitalization with COVID-19. OT attempted to use AMN interpreter, but dialect was incorrect. Pt's son who was present in room assisted with interpreting. In addition, tactile/visual cues utilized as pt is HOH. Pt's son reports that she is usually INDEP with self care at baseline and alternates cane or walker for fxl mobility. Pt presents this date with gross weakness and decreased fxl activity tolerance. She has some underlying R sided weakness from a previous stroke (2017) as well. Pt requires MIN A for sup<>sit to mange LEs d/t c/o R thigh pain and requires MIN A for sit<>stand with RW for balance/stability. Pt requires SETUP for seated/bed level UB ADLs and MIN/MOD A for LB ADLs. Demos flexed hips in standing requiring MIN/MOD Visual/tactile cues to come to more erect standing posture. Pt only tolerates ~20-30 second standing bout before requesting rest. Pt declines to get to chair with OT at this time, chair setup supplies left in room. Pt repositioned in bed. Bed alarm set and all needs in reach. Pt's son present throughout and at end of session. Anticipate pt will require some rehabilitation to regain strength. Will continue to follow pt acutely and assess for most appropriate d/c recommendations.     Follow Up Recommendations  SNF    Equipment Recommendations  3 in 1 bedside commode;Tub/shower seat    Recommendations for Other Services       Precautions / Restrictions Precautions Precautions: Fall Restrictions Weight Bearing Restrictions: No Other Position/Activity Restrictions: on O2      Mobility Bed  Mobility Overal bed mobility: Needs Assistance Bed Mobility: Supine to Sit;Sit to Supine     Supine to sit: Min assist Sit to supine: Min assist   General bed mobility comments: MIN A to manage LEs d/t c/o  R thigh pain    Transfers Overall transfer level: Needs assistance Equipment used: Rolling walker (2 wheeled) Transfers: Sit to/from Stand Sit to Stand: Min assist         General transfer comment: Pt slow to CTS, requires increased time and demos flexed hips in standing. With verbal/tactile cues, comes to slightly more erect posture, but still with some flexing of hips. Requires visual/tactile cues for safe walker use.    Balance Overall balance assessment: Needs assistance Sitting-balance support: Feet supported Sitting balance-Leahy Scale: Fair       Standing balance-Leahy Scale: Poor Standing balance comment: requires UE support on RW and MIN A to sustain static stand. Cannot accept challenge.                           ADL either performed or assessed with clinical judgement   ADL Overall ADL's : Needs assistance/impaired                                       General ADL Comments: SETUP for seated and bed level UB ADLs including oral care and washing face. Uses L UE for oral care. Son reports she's not necessarily L handed, but  needs to use L hand d/t stroke. Requires MIN A for seated LB ADLs. MIN A for bed mobility and ADL transfers with RW. Unable to tolerate fxl mobility this date, states she feels too weak.     Vision Patient Visual Report: No change from baseline Additional Comments: diffiuclt to formally assess d/t communication barrier. Pt appears to primarily track appropriately when attending to task     Perception     Praxis      Pertinent Vitals/Pain Pain Assessment: Faces Faces Pain Scale: Hurts little more Pain Location: R thigh Pain Descriptors / Indicators: Tender;Tightness Pain Intervention(s): Monitored during  session;Repositioned     Hand Dominance     Extremity/Trunk Assessment Upper Extremity Assessment Upper Extremity Assessment: Generalized weakness;RUE deficits/detail;LUE deficits/detail (lmiited shoulder ROM to ~3/4 range flexion. Grip MMT grossly 3+/5) RUE Deficits / Details: slightly weaker than L, son reports h/o stroke effecting her dominant side RUE Coordination: decreased fine motor;decreased gross motor   Lower Extremity Assessment Lower Extremity Assessment: Generalized weakness;Defer to PT evaluation       Communication Communication Communication: Prefers language other than English;HOH (pt with hering aide for L ear)   Cognition Arousal/Alertness: Awake/alert (drowsy/fatigued) Behavior During Therapy: WFL for tasks assessed/performed;Flat affect Overall Cognitive Status: Difficult to assess                                 General Comments: Pt able to follow simple one-step commands, sometimes use of visual cues was necessary as son could not accurately translate request. Pt oriented to self and place, some aspects of situation, not oritented to time. Difficult to formally assess as her son is helping ask questions/replying to me. Unclear how much he is/is not helping pt.   General Comments  Pt on supplemental nasal cannula. Sats sustained throughout. Son reports she is not on O2 at home.    Exercises Other Exercises Other Exercises: OT facilitates ed with pt and son re: role of OT, importance of OOB activity, lung benefits of sitting up, importance of doing as much I'ly as possible. Pt and son with moderate understanding considering communication barrier.   Shoulder Instructions      Home Living Family/patient expects to be discharged to:: Private residence Living Arrangements: Children (lives with daughter) Available Help at Discharge: Family;Available PRN/intermittently Type of Home: House                       Home Equipment: Walker - 2  wheels;Cane - single point          Prior Functioning/Environment Level of Independence: Needs assistance  Gait / Transfers Assistance Needed: Pt alternates RW and cane use per her son. ADL's / Homemaking Assistance Needed: pt typically INDEP with BADLs, but her son reports she has recently required increased help in past 1-2 weeks d/t weakness. Dtr that pt lives with performs most IADLs.   Comments: Pt needs laotian interpreter, OT attempted to utilize AMN iPad to no avail as the dialect was incorrect. Pt's son is present throughout and speaks english moderately well and assists during assessment.        OT Problem List: Decreased strength;Decreased range of motion;Decreased activity tolerance;Impaired balance (sitting and/or standing);Decreased knowledge of use of DME or AE;Cardiopulmonary status limiting activity      OT Treatment/Interventions: Self-care/ADL training    OT Goals(Current goals can be found in the care plan section) Acute Rehab OT Goals  Patient Stated Goal: to get stronger OT Goal Formulation: With patient/family Time For Goal Achievement: 11/13/20 Potential to Achieve Goals: Good ADL Goals Pt Will Perform Lower Body Dressing: with min guard assist;sit to/from stand (with AE PRN) Pt Will Transfer to Toilet: with min guard assist;with min assist;bedside commode (with LRAD to [facility] ~10' away to increase tolerance for fxl HH distances) Pt/caregiver will Perform Home Exercise Program: Increased strength;Both right and left upper extremity;With minimal assist Additional ADL Goal #1: pt will tolerate 10 mins unsupported sitting versus 1-2 mins of standing graded fxl tasks whether ADL or therapeutic activity to increase fxl acitivity tolerance.  OT Frequency: Min 1X/week   Barriers to D/C:            Co-evaluation              AM-PAC OT "6 Clicks" Daily Activity     Outcome Measure Help from another person eating meals?: A Little Help from another person  taking care of personal grooming?: A Little Help from another person toileting, which includes using toliet, bedpan, or urinal?: A Lot Help from another person bathing (including washing, rinsing, drying)?: A Lot Help from another person to put on and taking off regular upper body clothing?: A Little Help from another person to put on and taking off regular lower body clothing?: A Little 6 Click Score: 16   End of Session Equipment Utilized During Treatment: Gait belt;Rolling walker;Oxygen Nurse Communication: Mobility status  Activity Tolerance: Patient tolerated treatment well;Patient limited by fatigue;Patient limited by pain Patient left: in bed;with call bell/phone within reach;with bed alarm set;with family/visitor present  OT Visit Diagnosis: Unsteadiness on feet (R26.81);Muscle weakness (generalized) (M62.81)                Time: 7510-2585 OT Time Calculation (min): 28 min Charges:  OT General Charges $OT Visit: 1 Visit OT Evaluation $OT Eval Moderate Complexity: 1 Mod OT Treatments $Self Care/Home Management : 8-22 mins $Therapeutic Activity: 8-22 mins  Rejeana Brock, MS, OTR/L ascom [phone] 10/30/20, 12:43 PM

## 2020-10-30 NOTE — Plan of Care (Signed)

## 2020-10-30 NOTE — Care Management Obs Status (Signed)
MEDICARE OBSERVATION STATUS NOTIFICATION   Patient Details  Name: Gabriela Robles MRN: [id number] Date of Birth: Oct 14, 1929   Medicare Observation Status Notification Given:  Yes    Allayne Butcher, RN 10/30/2020, 11:08 AM

## 2020-10-30 NOTE — Progress Notes (Signed)
Received patient from ER, transported via stretcher accompanied by her son. Pt speaks [name], son at the bedside translating for patient. Placed on 2lpm/Sachse, heart monitor and continuous pulse ox. Call bell placed within reach. Orientation given. Bed alarm activated.

## 2020-10-30 NOTE — TOC Initial Note (Signed)
Transition of Care [facility]) - Initial/Assessment Note    Patient Details  Name: Gabriela Robles MRN: [id number] Date of Birth: 10/13/1929  Transition of Care [facility]) CM/SW Contact:    Allayne Butcher, RN Phone Number: 10/30/2020, 1:02 PM  Clinical Narrative:                 Patient placed under observation for intractable nausea and vomiting related to COVID 19.  RNCM was able to speak with patient's son and daughter via phone about discharge planning.  Patient has been staying with her daughter Gabriela Robles at [address] in [place].  Daughter is primary caregiver.  Daughter reports that the patient can walk with a walker but needs a new one.  Referral for new RW given to [facility] and will be delivered to the patient's room.  Patient is current with her PCP, Dr. Althea Charon, in [place] and uses [facility] [place] for [facility].   Family agrees to having home health services at discharge and is okay with uses [facility].  Barbara Cower with [facility] accepted referral for RN, PT, OT, and aide.   TOC team will cont to follow for needs.   Expected Discharge Plan: Home w Home Health Services Barriers to Discharge: Continued Medical Work up   Patient Goals and CMS Choice Patient states their goals for this hospitalization and ongoing recovery are:: Family would like for home health services to be arranged for discharge CMS Medicare.gov Compare Post Acute Care list provided to:: Patient Represenative (must comment) Choice offered to / list presented to : Adult Children  Expected Discharge Plan and Services Expected Discharge Plan: Home w Home Health Services   Discharge Planning Services: CM Consult Post Acute Care Choice: Home Health Living arrangements for the past 2 months: Single Family Home                           HH Arranged: RN,PT,OT,Nurse's Aide HH Agency: [facility] Date HH Agency Contacted: 10/30/20 Time HH Agency Contacted: 1100 Representative spoke with at [facility] Agency:  Barbara Cower  Prior Living Arrangements/Services Living arrangements for the past 2 months: Single Family Home Lives with:: Adult Children Patient language and need for interpreter reviewed:: Yes (Laotian) Do you feel safe going back to the place where you live?: Yes      Need for Family Participation in Patient Care: Yes (Comment) Care giver support system in place?: Yes (comment) (daughter and son) Current home services: DME (walker) Criminal Activity/Legal Involvement Pertinent to Current Situation/Hospitalization: No - Comment as needed  Activities of Daily Living Home Assistive Devices/Equipment: Hearing aid,Walker (specify type) ADL Screening (condition at time of admission) Patient's cognitive ability adequate to safely complete daily activities?: Yes Is the patient deaf or have difficulty hearing?: Yes Does the patient have difficulty seeing, even when wearing glasses/contacts?: No Does the patient have difficulty concentrating, remembering, or making decisions?: No Patient able to express need for assistance with ADLs?: Yes Does the patient have difficulty dressing or bathing?: No Independently performs ADLs?: Yes (appropriate for developmental age) Does the patient have difficulty walking or climbing stairs?: Yes Weakness of Legs: None Weakness of Arms/Hands: None  Permission Sought/Granted Permission sought to share information with : Case Manager,Family Supports,Other (comment) Permission granted to share information with : Yes, Verbal Permission Granted  Share Information with NAME: Bounthan and Naleudy  Permission granted to share info w AGENCY: [facility]  Permission granted to share info w Relationship: son and daughter  Emotional Assessment       Orientation: : Oriented to Self,Oriented to Place,Oriented to  Time,Oriented to Situation Alcohol / Substance Use: Not Applicable Psych Involvement: No (comment)  Admission diagnosis:  Atherosclerosis  [I70.90] Intractable vomiting with nausea, unspecified vomiting type [R11.2] Gastroenteritis due to COVID-19 virus [U07.1, A08.39] COVID-19 [U07.1] Patient Active Problem List   Diagnosis Date Noted  . COVID-19 10/30/2020  . Nausea vomiting and diarrhea 10/29/2020  . Elevated troponin 10/29/2020  . Hypokalemia 10/29/2020  . Gastroenteritis due to COVID-19 virus 10/29/2020  . Microcytic anemia 01/25/2020  . Elevated ferritin 01/25/2020  . Transaminitis 01/25/2020  . Abnormal liver ultrasound 01/25/2020  . Chronic gout of multiple sites 09/28/2019  . Weakness of right upper extremity 04/03/2018  . 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 Diabetic peripheral neuropathy (HCC) 11/20/2015  . History of cerebrovascular accident (CVA) with residual deficit 10/31/2015  . Essential (primary) hypertension 09/14/2015  . HLD (hyperlipidemia) 09/14/2015  . Type 2 diabetes mellitus, uncontrolled, with neuropathy (HCC) 09/14/2015  . History of arterial ischemic stroke 09/12/2015  . Iron deficiency anemia 07/24/2015  . Microcytic erythrocytes 04/17/2015  . Arthritis 04/15/2015  . Weight loss 04/15/2015   PCP:  Smitty Cords, DO Pharmacy:   [phone] [place] [place] - [address] [place] [address] Phone: [phone] Fax: [phone]     Social Determinants of Health (SDOH) Interventions    Readmission Risk Interventions No flowsheet data found.

## 2020-10-30 NOTE — Telephone Encounter (Signed)
He needs follow up appt w me.

## 2020-10-30 NOTE — Progress Notes (Addendum)
PROGRESS NOTE    Gabriela Robles  XTK:[id number] DOB: December 22, 1928 DOA: 10/29/2020 PCP: Smitty Cords, DO    Brief Narrative:  Mrs. Fennema was admitted to the hospital with a working diagnosis of acute hypoxic respiratory failure due to SARS COVID-19 viral pneumonia, complicated with viral gastroenteritis..  87 year old female with past medical history for hypertension, dyslipidemia, type II Titus mellitus, CVA, chronic anemia and gout who presented with nausea, vomiting and diarrhea.  Apparently patient was symptomatic for about 7 days, developing worsening generalized weakness, poor appetite and decreased p.o. intake. On her initial physical examination blood pressure 133/50, heart rate 60, oxygen saturation 96%, she had dry mucous membranes, lungs clear to auscultation bilaterally, heart S1-S2, present rhythmic, soft abdomen, no lower extremity edema.  Sodium 130, potassium 3.4, chloride 97, bicarb 21, glucose 131, BUN 21, creatinine 1.0, troponin I 45-47-57, white count 5.4, hemoglobin 10.1, hematocrit 32.3, platelets 177.  SARS COVID-19 positive. Urinalysis specific gravity 1.028, 21-50 red cells, more than 50 white cells.  CT of the abdomen and pelvis showing bilateral patchy airspace disease concerning for multifocal pneumonia, no acute changes in the abdomen pelvis. Chest radiograph with bilateral interstitial infiltrates, lower lobes and right upper lobe. EKG 66 bpm, normal axis, right bundle branch block, sinus rhythm, no ST segment or T wave changes.   Assessment & Plan:   Principal Problem:   Pneumonia due to COVID-19 virus Active Problems:   Iron deficiency anemia   Essential (primary) hypertension   Type 2 diabetes mellitus, uncontrolled, with neuropathy (HCC)   History of cerebrovascular accident (CVA) with residual deficit   Elevated troponin   Hypokalemia   Gastroenteritis due to COVID-19 virus  1. Acute hypoxemic respiratory failure due to SARS  COVID viral pneumonia.   RR: 18  Pulse oxymetry: 97%  Fi02: 2 L/min per [place]  COVID-19 Labs  Recent Labs    10/29/20 2151 10/29/20 2239 10/30/20 0606  FERRITIN 849*  --  1,409*  LDH 227*  --   --   CRP  --  17.5* 17.4*    Patient with improved dyspnea and low oxygen requirements but with very high inflammatory markers.  Elevated tropronin due to acute viral process not consistent with acs.  Continue medical therapy with remdesivir and will add systemic corticosteroids 40 mg IV q12 hr methylprednisolone.  On bronchodilator therapy, antitussive agents and airway clearing techniques.  Consult PT and Ot to improve mobility.  Supportive medical care for gastroenteritis, add pantoprazole.   2. HTN. Blood pressure 160/50 mmHg, will resume antihypertensive agents.  Continue blood pressure monitoring.  3. T2DM uncontrolled with Hgb A1c 9,3. Continue glucose cover and monitoring with insulin sliding scale.   4. Chronic anemia of iron deficiency Hgb stable continue with iron supplementation.  5. Hx of CVA patient on aspirin but not on statin. LDL 97, will resume low dose statin for now.   6. Hypokalemia with non anion gap metabolic acidosis corrected K up to 4,8 with stable renal function with serum cr at 0,84 and bicarbonate at 19.  Loss bicarb likely due to diarrhea, will add oral sodium bicarb for correction.   No results found for: SARSCOV2NAA   Patient continue to be at high risk for worsening respiratory failure   Status is: Inpatient  Remains inpatient appropriate because:IV treatments appropriate due to intensity of illness or inability to take PO   Dispo: The patient is from: Home              Anticipated d/c  is to: Home              Anticipated d/c date is: 3 days              Patient currently is not medically stable to d/c.   DVT prophylaxis: Enoxaparin   Code Status:   full  Family Communication:  I spoke with patient's son at the bedside, we talked in detail  about patient's condition, plan of care and prognosis and all questions were addressed.     Subjective: Patient is feeling better but not yet back to baseline, positive post-pandrial abdominal pain and diarrhea. Her son at the bedside translated.   Objective: Vitals:   10/30/20 0044 10/30/20 0454 10/30/20 0722 10/30/20 1122  BP: (!) 148/57 (!) 134/48 (!) 139/42 (!) 166/52  Pulse: 60 (!) 52 (!) 48 (!) 51  Resp: 18 18 17    Temp: (!) 97.5 F (36.4 C) 97.9 F (36.6 C) 97.8 F (36.6 C) 97.9 F (36.6 C)  TempSrc:   Oral   SpO2: 99% 97% 98% 100%  Weight:      Height:        Intake/Output Summary (Last 24 hours) at 10/30/2020 1432 Last data filed at 10/30/2020 0504 Gross per 24 hour  Intake 595.03 ml  Output 150 ml  Net 445.03 ml   Filed Weights   10/29/20 2242  Weight: 51.8 kg    Examination:   General: Not in pain or dyspnea, deconditioned  Neurology: Awake and alert, non focal  E ENT: no pallor, no icterus, oral mucosa moist Cardiovascular: No JVD. S1-S2 present, rhythmic, no gallops, rubs, or murmurs. No lower extremity edema. Pulmonary: positive breath sounds bilaterally, with no wheezing, rhonchi or rales. Gastrointestinal. Abdomen sift and non tender Skin. No rashes Musculoskeletal: positive interphalangeal joint deformities      Data Reviewed: I have personally reviewed following labs and imaging studies  CBC: Recent Labs  Lab 10/29/20 0103 10/30/20 0921  WBC 5.4 4.3  NEUTROABS  --  2.1  HGB 10.1* 9.2*  HCT 32.3* 28.7*  MCV 61.9* 61.7*  PLT 177 178   Basic Metabolic Panel: Recent Labs  Lab 10/29/20 0103 10/29/20 2151 10/30/20 0606  NA 130*  --  137  K 3.4*  --  4.8  CL 97*  --  110  CO2 21*  --  19*  GLUCOSE 131*  --  104*  BUN 21  --  23  CREATININE 1.01*  --  0.84  CALCIUM 8.4*  --  7.8*  MG  --  1.9 2.0   GFR: Estimated Creatinine Clearance: 31 mL/min (by C-G formula based on SCr of 0.84 mg/dL). Liver Function Tests: Recent Labs  Lab  10/29/20 0541 10/30/20 0606  AST 26 119*  ALT 14 32  ALKPHOS 53 177*  BILITOT 0.6 0.8  PROT 7.0 6.2*  ALBUMIN 3.2* 2.7*   No results for input(s): LIPASE, AMYLASE in the last 168 hours. No results for input(s): AMMONIA in the last 168 hours. Coagulation Profile: No results for input(s): INR, PROTIME in the last 168 hours. Cardiac Enzymes: No results for input(s): CKTOTAL, CKMB, CKMBINDEX, TROPONINI in the last 168 hours. BNP (last 3 results) No results for input(s): PROBNP in the last 8760 hours. HbA1C: Recent Labs    10/30/20 0606  HGBA1C 7.0*   CBG: Recent Labs  Lab 10/29/20 2235 10/30/20 0720 10/30/20 1121  GLUCAP 131* 89 74   Lipid Profile: Recent Labs    10/29/20 0541 10/30/20 0606  CHOL  --  149  HDL  --  36*  LDLCALC  --  97  TRIG 127 81  CHOLHDL  --  4.1   Thyroid Function Tests: No results for input(s): TSH, T4TOTAL, FREET4, T3FREE, THYROIDAB in the last 72 hours. Anemia Panel: Recent Labs    10/29/20 2151 10/30/20 0606  FERRITIN 849* 1,409*      Radiology Studies: I have reviewed all of the imaging during this hospital visit personally     Scheduled Meds: . vitamin C  500 mg Oral Daily  . enoxaparin (LOVENOX) injection  40 mg Subcutaneous Q24H  . insulin aspart  0-5 Units Subcutaneous QHS  . insulin aspart  0-9 Units Subcutaneous TID WC  . zinc sulfate  220 mg Oral Daily   Continuous Infusions: . sodium chloride 75 mL/hr at 10/30/20 1033  . remdesivir 100 mg in NS 100 mL 100 mg (10/30/20 0757)     LOS: 0 days        Corian Handley Gerome Apley, MD

## 2020-10-31 DIAGNOSIS — I10 Essential (primary) hypertension: Secondary | ICD-10-CM | POA: Diagnosis not present

## 2020-10-31 DIAGNOSIS — I6930 Unspecified sequelae of cerebral infarction: Secondary | ICD-10-CM | POA: Diagnosis not present

## 2020-10-31 DIAGNOSIS — U071 COVID-19: Secondary | ICD-10-CM | POA: Diagnosis not present

## 2020-10-31 DIAGNOSIS — E876 Hypokalemia: Secondary | ICD-10-CM | POA: Diagnosis not present

## 2020-10-31 LAB — COMPREHENSIVE METABOLIC PANEL
ALT: 70 U/L — ABNORMAL HIGH (ref 0–44)
AST: 186 U/L — ABNORMAL HIGH (ref 15–41)
Albumin: 2.7 g/dL — ABNORMAL LOW (ref 3.5–5.0)
Alkaline Phosphatase: 382 U/L — ABNORMAL HIGH (ref 38–126)
Anion gap: 11 (ref 5–15)
BUN: 28 mg/dL — ABNORMAL HIGH (ref 8–23)
CO2: 20 mmol/L — ABNORMAL LOW (ref 22–32)
Calcium: 7.9 mg/dL — ABNORMAL LOW (ref 8.9–10.3)
Chloride: 110 mmol/L (ref 98–111)
Creatinine, Ser: 0.93 mg/dL (ref 0.44–1.00)
GFR, Estimated: 59 mL/min — ABNORMAL LOW (ref >60)
Glucose, Bld: 178 mg/dL — ABNORMAL HIGH (ref 70–99)
Potassium: 4.8 mmol/L (ref 3.5–5.1)
Sodium: 141 mmol/L (ref 135–145)
Total Bilirubin: 1.4 mg/dL — ABNORMAL HIGH (ref 0.3–1.2)
Total Protein: 6.3 g/dL — ABNORMAL LOW (ref 6.5–8.1)

## 2020-10-31 LAB — GLUCOSE, CAPILLARY
Glucose-Capillary: 191 mg/dL — ABNORMAL HIGH (ref 70–99)
Glucose-Capillary: 159 mg/dL — ABNORMAL HIGH (ref 70–99)
Glucose-Capillary: 178 mg/dL — ABNORMAL HIGH (ref 70–99)
Glucose-Capillary: 187 mg/dL — ABNORMAL HIGH (ref 70–99)
Glucose-Capillary: 211 mg/dL — ABNORMAL HIGH (ref 70–99)

## 2020-10-31 LAB — C-REACTIVE PROTEIN: CRP: 15.5 mg/dL — ABNORMAL HIGH (ref <1.0)

## 2020-10-31 LAB — D-DIMER, QUANTITATIVE: D-Dimer, Quant: 0.73 ug/mL-FEU — ABNORMAL HIGH (ref 0.00–0.50)

## 2020-10-31 LAB — FERRITIN: Ferritin: 1783 ng/mL — ABNORMAL HIGH (ref 11–307)

## 2020-10-31 MED ORDER — ENOXAPARIN SODIUM 30 MG/0.3ML ~~LOC~~ SOLN
30.0000 mg | SUBCUTANEOUS | Status: DC
  Administered 2020-10-31 – 2020-11-01 (×2): 30 mg via SUBCUTANEOUS
  Filled 2020-10-31 (×2): qty 0.3

## 2020-10-31 NOTE — Plan of Care (Deleted)

## 2020-10-31 NOTE — Progress Notes (Signed)
PROGRESS NOTE    Gabriela Robles  GLO:[id number] DOB: November 08, 1928 DOA: 10/29/2020 PCP: Olin Hauser, DO    Brief Narrative:  Gabriela Robles was admitted to the hospital with a working diagnosis of acute hypoxic respiratory failure due to SARS COVID-19 viral pneumonia, complicated with viral gastroenteritis..  87 year old female with past medical history for hypertension, dyslipidemia, type II Titus mellitus, CVA, chronic anemia and gout who presented with nausea, vomiting and diarrhea.  Apparently patient was symptomatic for about 7 days, developing worsening generalized weakness, poor appetite and decreased p.o. intake. On her initial physical examination blood pressure 133/50, heart rate 60, oxygen saturation 96%, she had dry mucous membranes, lungs clear to auscultation bilaterally, heart S1-S2, present rhythmic, soft abdomen, no lower extremity edema.  Sodium 130, potassium 3.4, chloride 97, bicarb 21, glucose 131, BUN 21, creatinine 1.0, troponin I 45-47-57, white count 5.4, hemoglobin 10.1, hematocrit 32.3, platelets 177.  SARS COVID-19 positive. Urinalysis specific gravity 1.028, 21-50 red cells, more than 50 white cells.  CT of the abdomen and pelvis showing bilateral patchy airspace disease concerning for multifocal pneumonia, no acute changes in the abdomen pelvis. Chest radiograph with bilateral interstitial infiltrates, lower lobes and right upper lobe. EKG 66 bpm, normal axis, right bundle branch block, sinus rhythm, no ST segment or T wave changes.  Patient placed on medical therapy with remdesivir and systemic steroids.    Assessment & Plan:   Principal Problem:   Pneumonia due to COVID-19 virus Active Problems:   Iron deficiency anemia   Essential (primary) hypertension   Type 2 diabetes mellitus, uncontrolled, with neuropathy (HCC)   History of cerebrovascular accident (CVA) with residual deficit   Elevated troponin   Hypokalemia    Gastroenteritis due to COVID-19 virus   1. Acute hypoxemic respiratory failure due to SARS COVID viral pneumonia.   RR: 16  Pulse oxymetry: 97%  Fi02: 21% room air.   [place]    10/29/20 2151 10/29/20 2239 10/30/20 0606 10/31/20 0418 10/31/20 0606  DDIMER  --   --   --   --  0.73*  FERRITIN 849*  --  1,409* 1,783*  --   LDH 227*  --   --   --   --   CRP  --  17.5* 17.4* 15.5*  --    Fibrin derivatives. 735- 709  No results found for: SARSCOV2NAA  Elevated tropronin due to acute viral process not consistent with acs.   Continue to improve symptoms   Tolerating well remdesivir #3/5, systemic corticosteroids 40 mg IV q12 hr methylprednisolone, bronchodilator therapy, antitussive agents and airway clearing techniques.  Out of bed to chair tid with meals, follow with PT and OT recommendations  2. HTN. Continue blood pressure control with lisinopril.    3. T2DM uncontrolled with Hgb A1c 9,3. Fasting glucose is 178 mg/dl, per her son at the bedside her po intake has improved.   4. Chronic anemia of iron deficiency continue with iron supplements  5. Hx of CVA On aspirin and statin  6. Hypokalemia with non anion gap metabolic acidosis corrected electrolytes, renal function stable with serum cr at 0,93 and bicarbonate at 20.   discontinue sodium bicarbonate.   Patient continue to be at high risk for worsening viral pneumonia.   Status is: Inpatient  Remains inpatient appropriate because:IV treatments appropriate due to intensity of illness or inability to take PO   Dispo: The patient is from: Home  Anticipated d/c is to: Home              Anticipated d/c date is: 2 days              Patient currently is not medically stable to d/c.    DVT prophylaxis: Enoxaparin   Code Status:   full  Family Communication:  I spoke with patient's son at the bedside, we talked in detail about patient's condition, plan of care and prognosis and  all questions were addressed.      Subjective: Patient with improved po intake, abdominal pain and diarrhea have resolved. All information from her son at the bedside   Objective: Vitals:   10/30/20 1549 10/30/20 2012 10/31/20 0055 10/31/20 0438  BP: (!) 160/58 (!) 149/48 (!) 122/48 (!) 159/59  Pulse: (!) 58 64 (!) 53 (!) 52  Resp: 20 18 16 16   Temp: (!) 97.3 F (36.3 C) 98.7 F (37.1 C) 97.8 F (36.6 C) 97.7 F (36.5 C)  TempSrc: Oral     SpO2: 97% 95% 95% 97%  Weight:      Height:        Intake/Output Summary (Last 24 hours) at 10/31/2020 0736 Last data filed at 10/31/2020 0444 Gross per 24 hour  Intake 180 ml  Output 400 ml  Net -220 ml   Filed Weights   10/29/20 2242  Weight: 51.8 kg    Examination:   General: deconditioned  Neurology: Awake and alert, non focal  E ENT: no pallor, no icterus, oral mucosa moist Cardiovascular: No JVD. S1-S2 present, rhythmic, no gallops, rubs, or murmurs. No lower extremity edema. Pulmonary: positive breath sounds bilaterally,  Gastrointestinal. Abdomen soft and non tender Skin. No rashes Musculoskeletal: no joint deformities     Data Reviewed: I have personally reviewed following labs and imaging studies  CBC: Recent Labs  Lab 10/29/20 0103 10/30/20 0921  WBC 5.4 4.3  NEUTROABS  --  2.1  HGB 10.1* 9.2*  HCT 32.3* 28.7*  MCV 61.9* 61.7*  PLT 177 178   Basic Metabolic Panel: Recent Labs  Lab 10/29/20 0103 10/29/20 2151 10/30/20 0606 10/31/20 0418  NA 130*  --  137 141  K 3.4*  --  4.8 4.8  CL 97*  --  110 110  CO2 21*  --  19* 20*  GLUCOSE 131*  --  104* 178*  BUN 21  --  23 28*  CREATININE 1.01*  --  0.84 0.93  CALCIUM 8.4*  --  7.8* 7.9*  MG  --  1.9 2.0  --    GFR: Estimated Creatinine Clearance: 28 mL/min (by C-G formula based on SCr of 0.93 mg/dL). Liver Function Tests: Recent Labs  Lab 10/29/20 0541 10/30/20 0606 10/31/20 0418  AST 26 119* 186*  ALT 14 32 70*  ALKPHOS 53 177* 382*   BILITOT 0.6 0.8 1.4*  PROT 7.0 6.2* 6.3*  ALBUMIN 3.2* 2.7* 2.7*   No results for input(s): LIPASE, AMYLASE in the last 168 hours. No results for input(s): AMMONIA in the last 168 hours. Coagulation Profile: No results for input(s): INR, PROTIME in the last 168 hours. Cardiac Enzymes: No results for input(s): CKTOTAL, CKMB, CKMBINDEX, TROPONINI in the last 168 hours. BNP (last 3 results) No results for input(s): PROBNP in the last 8760 hours. HbA1C: Recent Labs    10/30/20 0606  HGBA1C 7.0*   CBG: Recent Labs  Lab 10/29/20 2235 10/30/20 0720 10/30/20 1121 10/30/20 1609 10/30/20 2014  GLUCAP 131* 89 74  88 105*   Lipid Profile: Recent Labs    10/29/20 0541 10/30/20 0606  CHOL  --  149  HDL  --  36*  LDLCALC  --  97  TRIG 127 81  CHOLHDL  --  4.1   Thyroid Function Tests: No results for input(s): TSH, T4TOTAL, FREET4, T3FREE, THYROIDAB in the last 72 hours. Anemia Panel: Recent Labs    10/30/20 0606 10/31/20 0418  FERRITIN 1,409* 1,783*      Radiology Studies: I have reviewed all of the imaging during this hospital visit personally     Scheduled Meds: . vitamin C  500 mg Oral Daily  . atorvastatin  20 mg Oral Daily  . enoxaparin (LOVENOX) injection  40 mg Subcutaneous Q24H  . insulin aspart  0-5 Units Subcutaneous QHS  . insulin aspart  0-9 Units Subcutaneous TID WC  . lisinopril  10 mg Oral Daily  . methylPREDNISolone (SOLU-MEDROL) injection  40 mg Intravenous Q12H  . omega-3 acid ethyl esters  1 g Oral BID  . pantoprazole  40 mg Oral BID  . sodium bicarbonate  650 mg Oral TID  . zinc sulfate  220 mg Oral Daily   Continuous Infusions: . remdesivir 100 mg in NS 100 mL 100 mg (10/30/20 0757)     LOS: 1 day        Gabriela Robles Annett Gula, MD

## 2020-10-31 NOTE — Progress Notes (Signed)
PHARMACIST - PHYSICIAN COMMUNICATION  CONCERNING:  Enoxaparin (Lovenox) for DVT Prophylaxis    RECOMMENDATION: Patient was prescribed enoxaparin 40mg  q24 hours for VTE prophylaxis.   Filed Weights   10/29/20 2242  Weight: 51.8 kg (114 lb 3.2 oz)    Body mass index is 23.07 kg/m.  Estimated Creatinine Clearance: 28 mL/min (by C-G formula based on SCr of 0.93 mg/dL).   Patient is candidate for enoxaparin 30mg  every 24 hours based on CrCl <18ml/min or Weight <45kg  DESCRIPTION: Pharmacy has adjusted enoxaparin dose per [facility] policy.  Patient is now receiving enoxaparin 30 mg every 24 hours   31m 10/31/2020 9:15 AM

## 2020-11-01 ENCOUNTER — Other Ambulatory Visit: Payer: Medicare Other

## 2020-11-01 DIAGNOSIS — I6930 Unspecified sequelae of cerebral infarction: Secondary | ICD-10-CM | POA: Diagnosis not present

## 2020-11-01 DIAGNOSIS — R778 Other specified abnormalities of plasma proteins: Secondary | ICD-10-CM | POA: Diagnosis not present

## 2020-11-01 DIAGNOSIS — U071 COVID-19: Secondary | ICD-10-CM | POA: Diagnosis not present

## 2020-11-01 DIAGNOSIS — I10 Essential (primary) hypertension: Secondary | ICD-10-CM | POA: Diagnosis not present

## 2020-11-01 LAB — COMPREHENSIVE METABOLIC PANEL
ALT: 63 U/L — ABNORMAL HIGH (ref 0–44)
AST: 82 U/L — ABNORMAL HIGH (ref 15–41)
Albumin: 2.7 g/dL — ABNORMAL LOW (ref 3.5–5.0)
Alkaline Phosphatase: 369 U/L — ABNORMAL HIGH (ref 38–126)
Anion gap: 10 (ref 5–15)
BUN: 37 mg/dL — ABNORMAL HIGH (ref 8–23)
CO2: 20 mmol/L — ABNORMAL LOW (ref 22–32)
Calcium: 8.2 mg/dL — ABNORMAL LOW (ref 8.9–10.3)
Chloride: 110 mmol/L (ref 98–111)
Creatinine, Ser: 1.13 mg/dL — ABNORMAL HIGH (ref 0.44–1.00)
GFR, Estimated: 47 mL/min — ABNORMAL LOW (ref >60)
Glucose, Bld: 213 mg/dL — ABNORMAL HIGH (ref 70–99)
Potassium: 4.4 mmol/L (ref 3.5–5.1)
Sodium: 140 mmol/L (ref 135–145)
Total Bilirubin: 1 mg/dL (ref 0.3–1.2)
Total Protein: 6.6 g/dL (ref 6.5–8.1)

## 2020-11-01 LAB — GLUCOSE, CAPILLARY
Glucose-Capillary: 211 mg/dL — ABNORMAL HIGH (ref 70–99)
Glucose-Capillary: 251 mg/dL — ABNORMAL HIGH (ref 70–99)
Glucose-Capillary: 237 mg/dL — ABNORMAL HIGH (ref 70–99)
Glucose-Capillary: 253 mg/dL — ABNORMAL HIGH (ref 70–99)

## 2020-11-01 LAB — C-REACTIVE PROTEIN: CRP: 9.6 mg/dL — ABNORMAL HIGH (ref <1.0)

## 2020-11-01 LAB — D-DIMER, QUANTITATIVE: D-Dimer, Quant: 0.57 ug/mL-FEU — ABNORMAL HIGH (ref 0.00–0.50)

## 2020-11-01 LAB — FERRITIN: Ferritin: 2026 ng/mL — ABNORMAL HIGH (ref 11–307)

## 2020-11-01 MED ORDER — ASPIRIN 81 MG PO CHEW
81.0000 mg | CHEWABLE_TABLET | Freq: Every day | ORAL | Status: DC
  Administered 2020-11-01 – 2020-11-02 (×2): 81 mg via ORAL
  Filled 2020-11-01 (×2): qty 1

## 2020-11-01 NOTE — Progress Notes (Signed)
PROGRESS NOTE    Gabriela Robles  XBM:[id number] DOB: [birth year] DOA: 10/29/2020 PCP: Olin Hauser, DO     Brief Narrative:  Mrs.Khamvanhthongwas admitted to the hospital with a working diagnosis ofacute hypoxic respiratory failure due to SARS [id number] pneumonia, complicated with viral gastroenteritis..  87 year old [place] female (primarily [place] speaking)  PMHx HTN, dyslipidemia, DM type II controlled with hyperglycemia, CVA, chronic anemia and gout   Presented with nausea, vomiting and diarrhea. Apparently patient was symptomatic for about 7 days, developing worsening generalized weakness, poor appetite and decreased p.o. intake. On her initial physical examination blood pressure 133/50, heart rate 60, oxygen saturation 96%, she had dry mucous membranes, lungs clear to auscultation bilaterally, heart S1-S2, present rhythmic, soft abdomen, no lower extremity edema.  Sodium 130, potassium 3.4, chloride 97, bicarb 21, glucose 131, BUN 21, creatinine 1.0, troponin I 45-47-57,white count 5.4, hemoglobin 10.1, hematocrit 32.3, platelets 177.SARS COVID-19 positive. Urinalysis specific gravity 1.028, 21-50 red cells, more than 50 white cells.  CT of the abdomen and pelvis showing bilateral patchy airspace disease concerning for multifocal pneumonia, no acute changes in the abdomen pelvis. Chest radiograph with bilateral interstitial infiltrates, lower lobes and right upper lobe. EKG 66 bpm, normal axis, right bundle branch block, sinus rhythm, no ST segment or T wave changes.  Patient placed on medical therapy with remdesivir and systemic steroids.     Subjective: Afebrile overnight, follows commands   Assessment & Plan: Covid vaccination;   Principal Problem:   Pneumonia due to COVID-19 virus Active Problems:   Iron deficiency anemia   Essential (primary) hypertension   Type 2 diabetes mellitus, uncontrolled, with neuropathy (HCC)   History of  cerebrovascular accident (CVA) with residual deficit   Elevated troponin   Hypokalemia   Gastroenteritis due to COVID-19 virus   Acute hypoxemic respiratory failure due to SARS COVID viral pneumonia. COVID-19 Labs  Recent Labs    10/29/20 2151 10/29/20 2239 10/30/20 0606 10/31/20 0418 10/31/20 0606 11/01/20 0436  DDIMER  --   --   --   --  0.73*  --   FERRITIN 849*  --  1,409* 1,783*  --  2,026*  LDH 227*  --   --   --   --   --   CRP  --  17.5* 17.4* 15.5*  --   --     1/2 POC SARS coronavirus positive x2 -Remdesivir per pharmacy  -Solu-Medrol 40 mg BID -Albuterol PRN -Incentive spirometry -Flutter valve -Out of bed to chair qac -PT recommends home health with 24-hour supervision.  Will need to discuss with family if this is available.   Elevated troponin Trend Results for Gabriela Robles (MRN [id number]) as of 11/01/2020 17:44  Ref. Range 10/29/2020 01:03 10/29/2020 05:41 10/29/2020 21:51 10/29/2020 22:39  Troponin I (High Sensitivity) Latest Ref Range: <18 ng/L 45 (H) 47 (H) 57 (H) 66 (H)  -Troponin not consistent with ACS patient having no symptoms consistent with ACS. -Secondary to Covid pneumonia  Essential HTN  -Lisinopril 10 mg daily   DM type II controlled with hyperglycemia - 1/3 hemoglobin A1c= 7.0 -Hyperglycemia most likely secondary to steroids   Chronic iron deficiency anemia -Anemia panel pending  Hx CVA -ASA 81 mg daily -Lipitor 20 mg daily  Hypokalemia -Resolved  Mild anion gap metabolic acidosis -Correcting    Patient continue to be at high risk for worsening viral pneumonia.     DVT prophylaxis: Lovenox Code Status: Full Family Communication:  Status is: Inpatient    Dispo:  The patient is from: Home              Anticipated d/c is to: Home              Anticipated d/c date is: 1/9              Patient currently unstable      Consultants:    Procedures/Significant Events:    I have personally reviewed and  interpreted all radiology studies and my findings are as above.  VENTILATOR SETTINGS: Room air 1/5 SPO2 90%   Cultures 1/2 POC SARS coronavirus positive x2  Antimicrobials: Anti-infectives (From admission, onward)   Start     Ordered Stop   10/30/20 1000  remdesivir 100 mg in sodium chloride 0.9 % 100 mL IVPB       "Followed by" Linked Group Details   10/29/20 1807 11/03/20 0959   10/29/20 1900  remdesivir 200 mg in sodium chloride 0.9% 250 mL IVPB       "Followed by" Linked Group Details   10/29/20 1807 10/30/20 0002       Devices    LINES / TUBES:      Continuous Infusions: . remdesivir 100 mg in NS 100 mL 100 mg (10/30/20 0757)     Objective: Vitals:   11/01/20 0011 11/01/20 0552 11/01/20 0558 11/01/20 0742  BP: (!) 143/58 (!) 166/41 (!) 148/74 (!) 158/45  Pulse: (!) 57 (!) 51  (!) 52  Resp: 20 18  18   Temp: (!) 97.5 F (36.4 C) 97.8 F (36.6 C)  98.2 F (36.8 C)  TempSrc:      SpO2: 94% 96%  90%  Weight:      Height:        Intake/Output Summary (Last 24 hours) at 11/01/2020 1113 Last data filed at 11/01/2020 12/30/2020 Gross per 24 hour  Intake 320 ml  Output 650 ml  Net -330 ml   Filed Weights   10/29/20 2242  Weight: 51.8 kg    Examination:  General: A/O x4 No acute respiratory distress Eyes: negative scleral hemorrhage, negative anisocoria, negative icterus ENT: Negative Runny nose, negative gingival bleeding, Neck:  Negative scars, masses, torticollis, lymphadenopathy, JVD Lungs: Clear to auscultation bilaterally without wheezes or crackles Cardiovascular: Regular rate and rhythm without murmur gallop or rub normal S1 and S2 Abdomen: negative abdominal pain, nondistended, positive soft, bowel sounds, no rebound, no ascites, no appreciable mass Extremities: No significant cyanosis, clubbing, or edema bilateral lower extremities Skin: Negative rashes, lesions, ulcers Psychiatric:  Negative depression, negative anxiety, negative fatigue, negative  mania  Central nervous system:  Cranial nerves II through XII intact, tongue/uvula midline, all extremities muscle strength 5/5, sensation intact throughout, negative dysarthria, negative expressive aphasia, negative receptive aphasia.  .     Data Reviewed: Care during the described time interval was provided by me .  I have reviewed this patient's available data, including medical history, events of note, physical examination, and all test results as part of my evaluation.  CBC: Recent Labs  Lab 10/29/20 0103 10/30/20 0921  WBC 5.4 4.3  NEUTROABS  --  2.1  HGB 10.1* 9.2*  HCT 32.3* 28.7*  MCV 61.9* 61.7*  PLT 177 178   Basic Metabolic Panel: Recent Labs  Lab 10/29/20 0103 10/29/20 2151 10/30/20 0606 10/31/20 0418 11/01/20 0436  NA 130*  --  137 141 140  K 3.4*  --  4.8 4.8 4.4  CL 97*  --  110 110 110  CO2 21*  --  19* 20* 20*  GLUCOSE 131*  --  104* 178* 213*  BUN 21  --  23 28* 37*  CREATININE 1.01*  --  0.84 0.93 1.13*  CALCIUM 8.4*  --  7.8* 7.9* 8.2*  MG  --  1.9 2.0  --   --    GFR: Estimated Creatinine Clearance: 23 mL/min (A) (by C-G formula based on SCr of 1.13 mg/dL (H)). Liver Function Tests: Recent Labs  Lab 10/29/20 0541 10/30/20 0606 10/31/20 0418 11/01/20 0436  AST 26 119* 186* 82*  ALT 14 32 70* 63*  ALKPHOS 53 177* 382* 369*  BILITOT 0.6 0.8 1.4* 1.0  PROT 7.0 6.2* 6.3* 6.6  ALBUMIN 3.2* 2.7* 2.7* 2.7*   No results for input(s): LIPASE, AMYLASE in the last 168 hours. No results for input(s): AMMONIA in the last 168 hours. Coagulation Profile: No results for input(s): INR, PROTIME in the last 168 hours. Cardiac Enzymes: No results for input(s): CKTOTAL, CKMB, CKMBINDEX, TROPONINI in the last 168 hours. BNP (last 3 results) No results for input(s): PROBNP in the last 8760 hours. HbA1C: Recent Labs    10/30/20 0606  HGBA1C 7.0*   CBG: Recent Labs  Lab 10/31/20 1144 10/31/20 1606 10/31/20 2024 10/31/20 2048 11/01/20 0802   GLUCAP 159* 178* 187* 211* 211*   Lipid Profile: Recent Labs    10/30/20 0606  CHOL 149  HDL 36*  LDLCALC 97  TRIG 81  CHOLHDL 4.1   Thyroid Function Tests: No results for input(s): TSH, T4TOTAL, FREET4, T3FREE, THYROIDAB in the last 72 hours. Anemia Panel: Recent Labs    10/31/20 0418 11/01/20 0436  FERRITIN 1,783* 2,026*   Sepsis Labs: Recent Labs  Lab 10/29/20 1652 10/29/20 2151  PROCALCITON <0.10  --   LATICACIDVEN 1.7 1.6    Recent Results (from the past 240 hour(s))  Blood Culture (routine x 2)     Status: None (Preliminary result)   Collection Time: 10/29/20  4:52 PM   Specimen: BLOOD  Result Value Ref Range Status   Specimen Description BLOOD BLOOD LEFT FOREARM  Final   Special Requests   Final    BOTTLES DRAWN AEROBIC AND ANAEROBIC Blood Culture results may not be optimal due to an inadequate volume of blood received in culture bottles   Culture   Final    NO GROWTH 3 DAYS Performed at [facility], [address] [place], [place] [address]    Report Status PENDING  Incomplete  Blood Culture (routine x 2)     Status: None (Preliminary result)   Collection Time: 10/29/20  4:52 PM   Specimen: BLOOD  Result Value Ref Range Status   Specimen Description BLOOD BLOOD LEFT FOREARM  Final   Special Requests   Final    BOTTLES DRAWN AEROBIC AND ANAEROBIC Blood Culture adequate volume   Culture   Final    NO GROWTH 3 DAYS Performed at [facility], [address] [place], [place] [address]    Report Status PENDING  Incomplete         Radiology Studies: No results found.      Scheduled Meds: . vitamin C  500 mg Oral Daily  . atorvastatin  20 mg Oral Daily  . enoxaparin (LOVENOX) injection  30 mg Subcutaneous Q24H  . insulin aspart  0-5 Units Subcutaneous QHS  . insulin aspart  0-9 Units Subcutaneous TID WC  . lisinopril  10 mg Oral Daily  . methylPREDNISolone (SOLU-MEDROL) injection  40 mg Intravenous Q12H  .  omega-3  acid ethyl esters  1 g Oral BID  . pantoprazole  40 mg Oral BID  . zinc sulfate  220 mg Oral Daily   Continuous Infusions: . remdesivir 100 mg in NS 100 mL 100 mg (10/30/20 0757)     LOS: 2 days    Time spent:40 min    Kadijah Shamoon, Roselind Messier, MD [facility] Pager [phone]  If 7PM-7AM, please contact night-coverage [email or web address] Password [email or web address] 11/01/2020, 11:13 AM

## 2020-11-01 NOTE — Progress Notes (Signed)
Physical Therapy Treatment Patient Details Name: Gabriela Robles MRN: [id number] DOB: 1929/03/29 Today's Date: 11/01/2020    History of Present Illness Pt is a 87 y/o F with PMH: HTN, HLD, stroke, DM, anemia, and gout who presented to ED with nausea, vomiting, and diarrhea. Found to be COVID +.    PT Comments    Pt is making gradual progress towards goals with ability to ambulate short distance in room. Still limited by communication barrier. Son in room to assist interpreter. Good endurance with there-x. Will continue to progress as able.   Follow Up Recommendations  Home health PT;Supervision/Assistance - 24 hour     Equipment Recommendations  Rolling walker with 5" wheels;3in1 (PT)    Recommendations for Other Services       Precautions / Restrictions Precautions Precautions: Fall Restrictions Weight Bearing Restrictions: No    Mobility  Bed Mobility Overal bed mobility: Needs Assistance Bed Mobility: Supine to Sit;Sit to Supine     Supine to sit: Min assist Sit to supine: Min guard   General bed mobility comments: able to sit at EOB and once seated progressed to mod I  Transfers Overall transfer level: Needs assistance Equipment used: Rolling walker (2 wheeled) Transfers: Sit to/from Stand Sit to Stand: Min assist         General transfer comment: needs gestures to follow commands. Once standing, upright posture noted. All mobility performed on RA with sats at 92%  Ambulation/Gait Ambulation/Gait assistance: Min guard Gait Distance (Feet): 25 Feet Assistive device: Rolling walker (2 wheeled) Gait Pattern/deviations: Step-to pattern     General Gait Details: ambulated in room with physical management to navigate around obstacles.   Stairs             Wheelchair Mobility    Modified Rankin (Stroke Patients Only)       Balance Overall balance assessment: Needs assistance Sitting-balance support: Feet supported Sitting balance-Leahy  Scale: Good     Standing balance support: Bilateral upper extremity supported Standing balance-Leahy Scale: Fair                              Cognition Arousal/Alertness: Awake/alert Behavior During Therapy: WFL for tasks assessed/performed;Flat affect Overall Cognitive Status: Difficult to assess                                        Exercises Other Exercises Other Exercises: Pt able to sit at EOB and eat few bites of graham cracker. B dentures donned. Other Exercises: Pt able to performed seated ther-ex x 5 reps including B LAQ and B shoulder flexion. Responds well to gestures    General Comments        Pertinent Vitals/Pain Pain Assessment: No/denies pain    Home Living                      Prior Function            PT Goals (current goals can now be found in the care plan section) Acute Rehab PT Goals Patient Stated Goal: to get stronger PT Goal Formulation: With patient Time For Goal Achievement: 11/13/20 Potential to Achieve Goals: Good Progress towards PT goals: Progressing toward goals    Frequency    Min 2X/week      PT Plan Current plan remains appropriate    Co-evaluation  AM-PAC PT "6 Clicks" Mobility   Outcome Measure  Help needed turning from your back to your side while in a flat bed without using bedrails?: A Little Help needed moving from lying on your back to sitting on the side of a flat bed without using bedrails?: A Little Help needed moving to and from a bed to a chair (including a wheelchair)?: A Little Help needed standing up from a chair using your arms (e.g., wheelchair or bedside chair)?: A Little Help needed to walk in hospital room?: A Little Help needed climbing 3-5 steps with a railing? : A Lot 6 Click Score: 17    End of Session   Activity Tolerance: Patient tolerated treatment well Patient left: in bed;with bed alarm set Nurse Communication: Mobility status PT  Visit Diagnosis: Unsteadiness on feet (R26.81);Muscle weakness (generalized) (M62.81);Difficulty in walking, not elsewhere classified (R26.2)     Time: 5498-2641 PT Time Calculation (min) (ACUTE ONLY): 26 min  Charges:  $Gait Training: 8-22 mins $Therapeutic Exercise: 8-22 mins                     Elizabeth Palau, PT, DPT [phone]    Baani Bober 11/01/2020, 4:19 PM

## 2020-11-01 NOTE — Progress Notes (Signed)
Inpatient Diabetes Program Recommendations  AACE/ADA: New Consensus Statement on Inpatient Glycemic Control (2015)  Target Ranges:  Prepandial:   less than 140 mg/dL      Peak postprandial:   less than 180 mg/dL (1-2 hours)      Critically ill patients:  140 - 180 mg/dL   Lab Results  Component Value Date   GLUCAP 251 (H) 11/01/2020   HGBA1C 7.0 (H) 10/30/2020    Review of Glycemic Control Results for Gabriela Robles, Gabriela Robles (MRN [id number]) as of 11/01/2020 12:26  Ref. Range 10/31/2020 16:06 10/31/2020 20:24 10/31/2020 20:48 11/01/2020 08:02 11/01/2020 11:37  Glucose-Capillary Latest Ref Range: 70 - 99 mg/dL 275 (H) 170 (H) 017 (H) 211 (H) 251 (H)   Diabetes history: DM 2 Outpatient Diabetes medications: Metformin 500 mg bid Current orders for Inpatient glycemic control:  Novolog sensitive tid with meals and HS Solumedrol 40 mg IV q 12 hours Inpatient Diabetes Program Recommendations:    While in the hospital and on steroids, consider adding Levemir 5 units daily.   Thanks  Beryl Meager, RN, BC-ADM Inpatient Diabetes Coordinator Pager [phone] (8a-5p)

## 2020-11-02 DIAGNOSIS — R778 Other specified abnormalities of plasma proteins: Secondary | ICD-10-CM | POA: Diagnosis not present

## 2020-11-02 DIAGNOSIS — I6930 Unspecified sequelae of cerebral infarction: Secondary | ICD-10-CM | POA: Diagnosis not present

## 2020-11-02 DIAGNOSIS — I10 Essential (primary) hypertension: Secondary | ICD-10-CM | POA: Diagnosis not present

## 2020-11-02 DIAGNOSIS — D508 Other iron deficiency anemias: Secondary | ICD-10-CM

## 2020-11-02 DIAGNOSIS — U071 COVID-19: Secondary | ICD-10-CM | POA: Diagnosis not present

## 2020-11-02 LAB — CBC WITH DIFFERENTIAL/PLATELET
Abs Immature Granulocytes: 0.08 10*3/uL — ABNORMAL HIGH (ref 0.00–0.07)
Basophils Absolute: 0 10*3/uL (ref 0.0–0.1)
Basophils Relative: 0 %
Eosinophils Absolute: 0 10*3/uL (ref 0.0–0.5)
Eosinophils Relative: 0 %
HCT: 32.1 % — ABNORMAL LOW (ref 36.0–46.0)
Hemoglobin: 10.4 g/dL — ABNORMAL LOW (ref 12.0–15.0)
Immature Granulocytes: 2 %
Lymphocytes Relative: 29 %
Lymphs Abs: 1.5 10*3/uL (ref 0.7–4.0)
MCH: 19.6 pg — ABNORMAL LOW (ref 26.0–34.0)
MCHC: 32.4 g/dL (ref 30.0–36.0)
MCV: 60.5 fL — ABNORMAL LOW (ref 80.0–100.0)
Monocytes Absolute: 0.5 10*3/uL (ref 0.1–1.0)
Monocytes Relative: 10 %
Neutro Abs: 3.1 10*3/uL (ref 1.7–7.7)
Neutrophils Relative %: 59 %
Platelets: 337 10*3/uL (ref 150–400)
RBC: 5.31 MIL/uL — ABNORMAL HIGH (ref 3.87–5.11)
RDW: 16.4 % — ABNORMAL HIGH (ref 11.5–15.5)
Smear Review: NORMAL
WBC: 5.2 10*3/uL (ref 4.0–10.5)
nRBC: 0 % (ref 0.0–0.2)

## 2020-11-02 LAB — RETICULOCYTES
Immature Retic Fract: 13.9 % (ref 2.3–15.9)
RBC.: 5.2 MIL/uL — ABNORMAL HIGH (ref 3.87–5.11)
Retic Count, Absolute: 43.2 10*3/uL (ref 19.0–186.0)
Retic Ct Pct: 0.8 % (ref 0.4–3.1)

## 2020-11-02 LAB — PHOSPHORUS: Phosphorus: 2.5 mg/dL (ref 2.5–4.6)

## 2020-11-02 LAB — D-DIMER, QUANTITATIVE: D-Dimer, Quant: 0.41 ug/mL-FEU (ref 0.00–0.50)

## 2020-11-02 LAB — C-REACTIVE PROTEIN: CRP: 5.9 mg/dL — ABNORMAL HIGH (ref <1.0)

## 2020-11-02 LAB — COMPREHENSIVE METABOLIC PANEL
ALT: 56 U/L — ABNORMAL HIGH (ref 0–44)
AST: 82 U/L — ABNORMAL HIGH (ref 15–41)
Albumin: 2.8 g/dL — ABNORMAL LOW (ref 3.5–5.0)
Alkaline Phosphatase: 330 U/L — ABNORMAL HIGH (ref 38–126)
Anion gap: 9 (ref 5–15)
BUN: 35 mg/dL — ABNORMAL HIGH (ref 8–23)
CO2: 23 mmol/L (ref 22–32)
Calcium: 8.4 mg/dL — ABNORMAL LOW (ref 8.9–10.3)
Chloride: 110 mmol/L (ref 98–111)
Creatinine, Ser: 1.03 mg/dL — ABNORMAL HIGH (ref 0.44–1.00)
GFR, Estimated: 52 mL/min — ABNORMAL LOW (ref >60)
Glucose, Bld: 182 mg/dL — ABNORMAL HIGH (ref 70–99)
Potassium: 4.4 mmol/L (ref 3.5–5.1)
Sodium: 142 mmol/L (ref 135–145)
Total Bilirubin: 0.7 mg/dL (ref 0.3–1.2)
Total Protein: 6.4 g/dL — ABNORMAL LOW (ref 6.5–8.1)

## 2020-11-02 LAB — GLUCOSE, CAPILLARY: Glucose-Capillary: 177 mg/dL — ABNORMAL HIGH (ref 70–99)

## 2020-11-02 LAB — FERRITIN: Ferritin: 1371 ng/mL — ABNORMAL HIGH (ref 11–307)

## 2020-11-02 LAB — IRON AND TIBC
Iron: 111 ug/dL (ref 28–170)
Saturation Ratios: 71 % — ABNORMAL HIGH (ref 10.4–31.8)
TIBC: 157 ug/dL — ABNORMAL LOW (ref 250–450)
UIBC: 46 ug/dL

## 2020-11-02 LAB — VITAMIN B12: Vitamin B-12: 1980 pg/mL — ABNORMAL HIGH (ref 180–914)

## 2020-11-02 LAB — MAGNESIUM: Magnesium: 2.2 mg/dL (ref 1.7–2.4)

## 2020-11-02 LAB — LACTATE DEHYDROGENASE: LDH: 228 U/L — ABNORMAL HIGH (ref 98–192)

## 2020-11-02 LAB — FOLATE: Folate: 21.4 ng/mL (ref >5.9)

## 2020-11-02 MED ORDER — PREDNISONE 20 MG PO TABS: 20.0000 mg | ORAL_TABLET | Freq: Every day | ORAL | Status: DC

## 2020-11-02 MED ORDER — ALBUTEROL SULFATE HFA 108 (90 BASE) MCG/ACT IN AERS: 2.0000 | INHALATION_SPRAY | RESPIRATORY_TRACT | 0 refills | Status: DC | PRN

## 2020-11-02 MED ORDER — ATORVASTATIN CALCIUM 20 MG PO TABS: 20.0000 mg | ORAL_TABLET | Freq: Every day | ORAL | 0 refills | Status: DC

## 2020-11-02 MED ORDER — GUAIFENESIN-DM 100-10 MG/5ML PO SYRP: 5.0000 mL | ORAL_SOLUTION | Freq: Four times a day (QID) | ORAL | 0 refills | Status: DC | PRN

## 2020-11-02 MED ORDER — PANTOPRAZOLE SODIUM 40 MG PO TBEC
40.0000 mg | DELAYED_RELEASE_TABLET | Freq: Two times a day (BID) | ORAL | 0 refills | Status: DC
Start: 2020-11-02 — End: 2020-11-14

## 2020-11-02 MED ORDER — PREDNISONE 20 MG PO TABS: 20.0000 mg | ORAL_TABLET | Freq: Every day | ORAL | 0 refills | Status: DC

## 2020-11-02 NOTE — TOC Transition Note (Signed)
Transition of Care [facility]) - CM/SW Discharge Note   Patient Details  Name: Gabriela Robles MRN: [id number] Date of Birth: 05/22/1929  [facility] CM/SW Contact:  Allayne Butcher, RN Phone Number: 11/02/2020, 11:10 AM   Clinical Narrative:    Patient is medically cleared for discharge home with home health services.  [facility] C[facility] with [facility] accepted referral and is aware of DC home today.  Walker and 3 in 1 delivered to room earlier this week.  Patient's son is in the room with her and will transport patient home.    Final next level of care: Home w Home Health Services Barriers to Discharge: Barriers Resolved   Patient Goals and CMS Choice Patient states their goals for this hospitalization and ongoing recovery are:: Family would like for home health services to be arranged for discharge CMS Medicare.gov Compare Post Acute Care list provided to:: Patient Represenative (must comment) Choice offered to / list presented to : Adult Children  Discharge Placement                       Discharge Plan and Services   Discharge Planning Services: CM Consult Post Acute Care Choice: Home Health          DME Arranged: Walker rolling DME Agency: [facility] Date DME Agency Contacted: 10/31/20   Representative spoke with at DME Agency: Elease Hashimoto HH Arranged: RN,PT,OT HH Agency: [facility] Date HH Agency Contacted: 11/02/20 Time HH Agency Contacted: 1109 Representative spoke with at [facility] Agency: Barbara Cower  Social Determinants of Health (SDOH) Interventions     Readmission Risk Interventions No flowsheet data found.

## 2020-11-02 NOTE — Discharge Summary (Addendum)
Physician Discharge Summary  Gabriela Robles HUD:[id number] DOB: 08-14-1929 DOA: 10/29/2020  PCP: Smitty Cords, DO  Admit date: 10/29/2020 Discharge date: 11/02/2020  Admitted From: Home  Disposition:  Home   Recommendations for Outpatient Follow-up and new medication changes:  1. Follow up with Dr Althea Charon in 2 weeks.  2. Continue self quarantine for 2 more weeks, use a mask in public and maintain physical distancing.  3. Continue prednisone 20 mg for 7 more days. 4. Added pantoprazole for dyspepsia for 14 days.   I spoke with patient's son at the bedside, we talked in detail about patient's condition, plan of care and prognosis and all questions were addressed.'  Home Health: yes   Equipment/Devices: na    Discharge Condition: stable  CODE STATUS: full  Diet recommendation: heart healthy   Brief/Interim Summary: Mrs.Khamvanhthongwas admitted to the hospital with a working diagnosis ofacute hypoxic respiratory failure due to SARS COVID-19viral pneumonia, complicated with viral gastroenteritis..  87 year old female with past medical history for hypertension, dyslipidemia, type II Titus mellitus, CVA, chronic anemia and gout who presented with nausea, vomiting and diarrhea. Apparently patient was symptomatic for about 7 days, developing worsening generalized weakness, poor appetite and decreased p.o. intake. On her initial physical examination blood pressure 133/50, heart rate 60, oxygen saturation 96%, she had dry mucous membranes, lungs clear to auscultation bilaterally, heart S1-S2, present rhythmic, soft abdomen, no lower extremity edema.  Sodium 130, potassium 3.4, chloride 97, bicarb 21, glucose 131, BUN 21, creatinine 1.0, troponin I 45-47-57,white count 5.4, hemoglobin 10.1, hematocrit 32.3, platelets 177.SARS COVID-19 positive. Urinalysis specific gravity 1.028, 21-50 red cells, more than 50 white cells.  CT of the abdomen and pelvis showing bilateral  patchy airspace disease concerning for multifocal pneumonia, no acute changes in the abdomen pelvis. Chest radiograph with bilateral interstitial infiltrates, lower lobes and right upper lobe. EKG 66 bpm, normal axis, right bundle branch block, sinus rhythm, no ST segment or T wave changes.  Patient placed on medical therapy with remdesivir and systemic steroids, with good toleration.   1.  Acute hypoxemic respiratory failure due to SARS COVID-19 viral pneumonia. Patient responded well to medical therapy with systemic corticosteroids and remdesivir.  She was treated with antitussive agents, bronchodilators and airway clearing techniques. Her symptoms, inflammatory markers and oxygenation improved.  COVID-19 Labs  Recent Labs    10/31/20 0418 10/31/20 0606 11/01/20 0436 11/02/20 0514  DDIMER  --  0.73* 0.57*  --   FERRITIN 1,783*  --  2,026* 1,371*  LDH  --   --   --  228*  CRP 15.5*  --  9.6*  --    Fibrin derivatives 735 -709 (troponin elevation due to acute viral illness, not consistent with acute coronary syndrome). Mild AST/ALT elevation, 82/56 respectively, related to acute viral disease.  No results found for: SARSCOV2NAA  Patient will continue 1 new 4-week of steroids, follow-up as an outpatient in 2 weeks by primary care provider.  Physical therapy/occupational therapy have recommended home health services.  Continue prednisone 20 mg for 7 more days.   2.  Hypertension.  Continue blood pressure control with lisinopril.  3.  Type 2 diabetes mellitus, uncontrolled, hemoglobin A1c 9.3.  Patient received insulin therapy during her hospitalization, sliding scale with good toleration. Continue home glucose monitoring. Resume metformin.   4.  Chronic iron deficiency anemia.  Her hemoglobin remained stable, continue oral iron supplements.  5.  History of CVA.  Patient will continue aspirin and statin.  6.  Hypokalemia  with known anion gap metabolic acidosis.  Likely related  to diarrhea due to COVID-19 viral infection/ viral gastroenteritis. Potassium was corrected with potassium chloride she also received oral sodium bicarb with good toleration. At discharge sodium 142, potassium 4.4, chloride 110, bicarb 23, glucose 82, BUN 35, creatinine 1.03, magnesium 2.2.  7. GERD. Continue pantoprazole for 14 days.   Discharge Diagnoses:  Principal Problem:   Pneumonia due to COVID-19 virus Active Problems:   Iron deficiency anemia   Essential (primary) hypertension   Type 2 diabetes mellitus, uncontrolled, with neuropathy (HCC)   History of cerebrovascular accident (CVA) with residual deficit   Elevated troponin   Hypokalemia   Gastroenteritis due to COVID-19 virus    Discharge Instructions   Allergies as of 11/02/2020   No Known Allergies     Medication List    TAKE these medications   albuterol 108 (90 Base) MCG/ACT inhaler Commonly known as: VENTOLIN HFA Inhale 2 puffs into the lungs every 4 (four) hours as needed for wheezing or shortness of breath.   atorvastatin 20 MG tablet Commonly known as: LIPITOR Take 1 tablet (20 mg total) by mouth daily. Start taking on: November 03, 2020   [employer] Aspirin Adult Low Dose 81 MG chewable tablet Generic drug: aspirin Chew 81 mg by mouth daily.   Fish Oil 1000 MG Cpdr Take by mouth.   gabapentin 100 MG capsule Commonly known as: NEURONTIN Take by mouth.   guaiFENesin-dextromethorphan 100-10 MG/5ML syrup Commonly known as: ROBITUSSIN DM Take 5 mLs by mouth every 6 (six) hours as needed for cough.   lisinopril 10 MG tablet Commonly known as: ZESTRIL TAKE 1 TABLET BY MOUTH EVERY DAY   metFORMIN 500 MG 24 hr tablet Commonly known as: GLUCOPHAGE-XR Take 500 mg by mouth 2 (two) times daily with a meal.   MULTIPLE VITAMINS-MINERALS ER PO Take by mouth.   pantoprazole 40 MG tablet Commonly known as: PROTONIX Take 1 tablet (40 mg total) by mouth 2 (two) times daily for 14 doses.   predniSONE 20 MG  tablet Commonly known as: DELTASONE Take 1 tablet (20 mg total) by mouth daily with breakfast. Start taking on: November 03, 2020   Vitron-C 65-125 MG Tabs Generic drug: Iron-Vitamin C TAKE 1 TABLET BY MOUTH EVERY DAY            Durable Medical Equipment  (From admission, onward)         Start     Ordered   10/30/20 1304  For home use only DME Walker rolling  Once       Question Answer Comment  Walker: With 5 Inch Wheels   Patient needs a walker to treat with the following condition COVID-19   Patient needs a walker to treat with the following condition Weakness generalized      10/30/20 1303   10/30/20 1304  For home use only DME 3 n 1  Once        10/30/20 1303          Follow-up Information    Schedule an appointment as soon as possible for a visit  with Smitty Cords, DO.   Specialty: Family Medicine Contact information: [address] [place] [address] [phone]              No Known Allergies    Procedures/Studies: DG Chest 2 View  Result Date: 10/29/2020 CLINICAL DATA:  Increased weakness, nausea, vomiting, and diarrhea for 1 week. EXAM: CHEST - 2 VIEW  COMPARISON:  None. FINDINGS: Shallow inspiration. Cardiac enlargement. Small amount of fluid in the right minor fissure. Diffuse reticulonodular interstitial infiltrates throughout the lungs. This is likely edema but multifocal pneumonia would also be a consideration. No pneumothorax. Calcified and tortuous aorta. Degenerative changes in the spine. IMPRESSION: Cardiac enlargement. Diffuse reticulonodular interstitial infiltrates throughout the lungs likely edema but multifocal pneumonia would also be a consideration. Small amount of fluid in the minor fissure. Electronically Signed   By: Lucienne Capers M.D.   On: 10/29/2020 16:03   CT ABDOMEN PELVIS W CONTRAST  Result Date: 10/29/2020 CLINICAL DATA:  Weakness. EXAM: CT ABDOMEN AND PELVIS WITH CONTRAST TECHNIQUE: Multidetector CT imaging of the  abdomen and pelvis was performed using the standard protocol following bolus administration of intravenous contrast. CONTRAST:  53mL OMNIPAQUE IOHEXOL 300 MG/ML  SOLN COMPARISON:  None. FINDINGS: Lower chest: Bilateral patchy airspace opacities are noted concerning for multifocal pneumonia. Hepatobiliary: No focal liver abnormality is seen. Status post cholecystectomy. No biliary dilatation. Pancreas: Unremarkable. No pancreatic ductal dilatation or surrounding inflammatory changes. Spleen: Normal in size without focal abnormality. Adrenals/Urinary Tract: Adrenal glands are unremarkable. Kidneys are normal, without renal calculi, focal lesion, or hydronephrosis. Bladder is unremarkable. Stomach/Bowel: Stomach is within normal limits. Appendix appears normal. No evidence of bowel wall thickening, distention, or inflammatory changes. Vascular/Lymphatic: Aortic atherosclerosis. No enlarged abdominal or pelvic lymph nodes. Reproductive: Uterus and bilateral adnexa are unremarkable. Other: No abdominal wall hernia or abnormality. No abdominopelvic ascites. Musculoskeletal: No acute or significant osseous findings. IMPRESSION: 1. Bilateral patchy airspace opacities are noted concerning for multifocal pneumonia. 2. Aortic atherosclerosis. 3. No acute abnormality seen in the abdomen or pelvis. Aortic Atherosclerosis (ICD10-I70.0). Electronically Signed   By: Marijo Conception M.D.   On: 10/29/2020 16:01        Subjective: Patient is feeling better, improved po intake, no nausea or vomiting. Translation per her son at the bedside   Discharge Exam: Vitals:   11/02/20 0452 11/02/20 0735  BP: (!) 152/56 (!) 147/52  Pulse: (!) 52 (!) 48  Resp: 16 16  Temp: 98 F (36.7 C) 97.7 F (36.5 C)  SpO2: 94% 93%   Vitals:   11/01/20 2117 11/02/20 0015 11/02/20 0452 11/02/20 0735  BP: (!) 159/60 (!) 140/39 (!) 152/56 (!) 147/52  Pulse: (!) 52 (!) 53 (!) 52 (!) 48  Resp: 20 16 16 16   Temp: 98.3 F (36.8 C) 97.6 F  (36.4 C) 98 F (36.7 C) 97.7 F (36.5 C)  TempSrc:    Oral  SpO2: 94% 94% 94% 93%  Weight:      Height:        General: Not in pain or dyspnea.  Neurology: Awake and alert, non focal  E ENT: no pallor, no icterus, oral mucosa moist Cardiovascular: No JVD. S1-S2 present, rhythmic, no gallops, rubs, or murmurs. No lower extremity edema. Pulmonary: positive breath sounds bilaterally, with no wheezing, rhonchi or rales. Gastrointestinal. Abdomen soft and non tender Skin. No rashes Musculoskeletal: no joint deformities   The results of significant diagnostics from this hospitalization (including imaging, microbiology, ancillary and laboratory) are listed below for reference.     Microbiology: Recent Results (from the past 240 hour(s))  Blood Culture (routine x 2)     Status: None (Preliminary result)   Collection Time: 10/29/20  4:52 PM   Specimen: BLOOD  Result Value Ref Range Status   Specimen Description BLOOD BLOOD LEFT FOREARM  Final   Special Requests   Final  BOTTLES DRAWN AEROBIC AND ANAEROBIC Blood Culture results may not be optimal due to an inadequate volume of blood received in culture bottles   Culture   Final    NO GROWTH 4 DAYS Performed at [facility], [address] [place], [place] [address]    Report Status PENDING  Incomplete  Blood Culture (routine x 2)     Status: None (Preliminary result)   Collection Time: 10/29/20  4:52 PM   Specimen: BLOOD  Result Value Ref Range Status   Specimen Description BLOOD BLOOD LEFT FOREARM  Final   Special Requests   Final    BOTTLES DRAWN AEROBIC AND ANAEROBIC Blood Culture adequate volume   Culture   Final    NO GROWTH 4 DAYS Performed at [facility], [address] [place], [place] [address]    Report Status PENDING  Incomplete     Labs: BNP (last 3 results) Recent Labs    10/29/20 2151  BNP 80.9   Basic Metabolic Panel: Recent Labs  Lab 10/29/20 0103 10/29/20 2151  10/30/20 0606 10/31/20 0418 11/01/20 0436 11/02/20 0514  NA 130*  --  137 141 140 142  K 3.4*  --  4.8 4.8 4.4 4.4  CL 97*  --  110 110 110 110  CO2 21*  --  19* 20* 20* 23  GLUCOSE 131*  --  104* 178* 213* 182*  BUN 21  --  23 28* 37* 35*  CREATININE 1.01*  --  0.84 0.93 1.13* 1.03*  CALCIUM 8.4*  --  7.8* 7.9* 8.2* 8.4*  MG  --  1.9 2.0  --   --  2.2  PHOS  --   --   --   --   --  2.5   Liver Function Tests: Recent Labs  Lab 10/29/20 0541 10/30/20 0606 10/31/20 0418 11/01/20 0436 11/02/20 0514  AST 26 119* 186* 82* 82*  ALT 14 32 70* 63* 56*  ALKPHOS 53 177* 382* 369* 330*  BILITOT 0.6 0.8 1.4* 1.0 0.7  PROT 7.0 6.2* 6.3* 6.6 6.4*  ALBUMIN 3.2* 2.7* 2.7* 2.7* 2.8*   No results for input(s): LIPASE, AMYLASE in the last 168 hours. No results for input(s): AMMONIA in the last 168 hours. CBC: Recent Labs  Lab 10/29/20 0103 10/30/20 0921 11/02/20 0514  WBC 5.4 4.3 5.2  NEUTROABS  --  2.1 3.1  HGB 10.1* 9.2* 10.4*  HCT 32.3* 28.7* 32.1*  MCV 61.9* 61.7* 60.5*  PLT 177 178 337   Cardiac Enzymes: No results for input(s): CKTOTAL, CKMB, CKMBINDEX, TROPONINI in the last 168 hours. BNP: Invalid input(s): POCBNP CBG: Recent Labs  Lab 11/01/20 0802 11/01/20 1137 11/01/20 1538 11/01/20 2121 11/02/20 0729  GLUCAP 211* 251* 237* 253* 177*   D-Dimer Recent Labs    10/31/20 0606 11/01/20 0436  DDIMER 0.73* 0.57*   Hgb A1c No results for input(s): HGBA1C in the last 72 hours. Lipid Profile No results for input(s): CHOL, HDL, LDLCALC, TRIG, CHOLHDL, LDLDIRECT in the last 72 hours. Thyroid function studies No results for input(s): TSH, T4TOTAL, T3FREE, THYROIDAB in the last 72 hours.  Invalid input(s): FREET3 Anemia work up Recent Labs    11/01/20 0436 11/02/20 0514  FOLATE  --  21.4  FERRITIN 2,026* 1,371*  TIBC  --  157*  IRON  --  111  RETICCTPCT  --  0.8   Urinalysis    Component Value Date/Time   COLORURINE YELLOW (A) 10/29/2020 1652    APPEARANCEUR  CLOUDY (A) 10/29/2020 1652   LABSPEC 1.028 10/29/2020 1652   PHURINE 5.0 10/29/2020 1652   GLUCOSEU NEGATIVE 10/29/2020 1652   HGBUR LARGE (A) 10/29/2020 1652   BILIRUBINUR NEGATIVE 10/29/2020 1652   KETONESUR 5 (A) 10/29/2020 1652   PROTEINUR NEGATIVE 10/29/2020 1652   NITRITE POSITIVE (A) 10/29/2020 1652   LEUKOCYTESUR LARGE (A) 10/29/2020 1652   Sepsis Labs Invalid input(s): PROCALCITONIN,  WBC,  LACTICIDVEN Microbiology Recent Results (from the past 240 hour(s))  Blood Culture (routine x 2)     Status: None (Preliminary result)   Collection Time: 10/29/20  4:52 PM   Specimen: BLOOD  Result Value Ref Range Status   Specimen Description BLOOD BLOOD LEFT FOREARM  Final   Special Requests   Final    BOTTLES DRAWN AEROBIC AND ANAEROBIC Blood Culture results may not be optimal due to an inadequate volume of blood received in culture bottles   Culture   Final    NO GROWTH 4 DAYS Performed at [facility], [address], [place], [place] [address]    Report Status PENDING  Incomplete  Blood Culture (routine x 2)     Status: None (Preliminary result)   Collection Time: 10/29/20  4:52 PM   Specimen: BLOOD  Result Value Ref Range Status   Specimen Description BLOOD BLOOD LEFT FOREARM  Final   Special Requests   Final    BOTTLES DRAWN AEROBIC AND ANAEROBIC Blood Culture adequate volume   Culture   Final    NO GROWTH 4 DAYS Performed at [facility], [address] [place], [place] [address]    Report Status PENDING  Incomplete     Time coordinating discharge: 45 minutes  SIGNED:   Coralie Keens, MD  [facility] 11/02/2020, 9:19 AM

## 2020-11-03 ENCOUNTER — Telehealth: Payer: Self-pay

## 2020-11-03 LAB — CULTURE, BLOOD (ROUTINE X 2)
Culture: NO GROWTH
Culture: NO GROWTH
Special Requests: ADEQUATE

## 2020-11-03 LAB — HAPTOGLOBIN: Haptoglobin: 198 mg/dL (ref 41–333)

## 2020-11-03 NOTE — Telephone Encounter (Signed)
Per daughter Mertha Finders  Transition Care Management Follow-up Telephone Call  Date of discharge and from where: 11/02/2020 [facility]  How have you been since you were released from the hospital? Feels weak, but eating well  Any questions or concerns? No  Items Reviewed:  Did the pt receive and understand the discharge instructions provided? Yes   Medications obtained and verified? Yes   Other? No   Any new allergies since your discharge? No   Dietary orders reviewed? Yes  Do you have support at home? Yes   Home Care and Equipment/Supplies: Were home health services ordered? not applicable If so, what is the name of the agency? n/a  Has the agency set up a time to come to the patient's home? not applicable Were any new equipment or medical supplies ordered?  No What is the name of the medical supply agency? n/a Were you able to get the supplies/equipment? not applicable Do you have any questions related to the use of the equipment or supplies? No  Functional Questionnaire: (I = Independent and D = Dependent) ADLs: I  Bathing/Dressing- I  Meal Prep- D  Eating- I  Maintaining continence- I  Transferring/Ambulation- I  Managing Meds- D  Follow up appointments reviewed:   PCP Hospital f/u appt confirmed? Yes  Scheduled to see Dr. Althea Charon on 11/21/2020 @ 9:00.  Are transportation arrangements needed? No   If their condition worsens, is the pt aware to call PCP or go to the Emergency Dept.? Yes  Was the patient provided with contact information for the PCP's office or ED? Yes  Was to pt encouraged to call back with questions or concerns? Yes

## 2020-11-04 DIAGNOSIS — K219 Gastro-esophageal reflux disease without esophagitis: Secondary | ICD-10-CM | POA: Diagnosis not present

## 2020-11-04 DIAGNOSIS — Z7982 Long term (current) use of aspirin: Secondary | ICD-10-CM | POA: Diagnosis not present

## 2020-11-04 DIAGNOSIS — Z8673 Personal history of transient ischemic attack (TIA), and cerebral infarction without residual deficits: Secondary | ICD-10-CM | POA: Diagnosis not present

## 2020-11-04 DIAGNOSIS — D509 Iron deficiency anemia, unspecified: Secondary | ICD-10-CM | POA: Diagnosis not present

## 2020-11-04 DIAGNOSIS — M109 Gout, unspecified: Secondary | ICD-10-CM | POA: Diagnosis not present

## 2020-11-04 DIAGNOSIS — I700 Atherosclerosis of aorta: Secondary | ICD-10-CM | POA: Diagnosis not present

## 2020-11-04 DIAGNOSIS — Z7984 Long term (current) use of oral hypoglycemic drugs: Secondary | ICD-10-CM | POA: Diagnosis not present

## 2020-11-04 DIAGNOSIS — J1282 Pneumonia due to coronavirus disease 2019: Secondary | ICD-10-CM | POA: Diagnosis not present

## 2020-11-04 DIAGNOSIS — E785 Hyperlipidemia, unspecified: Secondary | ICD-10-CM | POA: Diagnosis not present

## 2020-11-04 DIAGNOSIS — U071 COVID-19: Secondary | ICD-10-CM | POA: Diagnosis not present

## 2020-11-04 DIAGNOSIS — E1165 Type 2 diabetes mellitus with hyperglycemia: Secondary | ICD-10-CM | POA: Diagnosis not present

## 2020-11-04 DIAGNOSIS — M1990 Unspecified osteoarthritis, unspecified site: Secondary | ICD-10-CM | POA: Diagnosis not present

## 2020-11-04 DIAGNOSIS — Z9181 History of falling: Secondary | ICD-10-CM | POA: Diagnosis not present

## 2020-11-04 DIAGNOSIS — I10 Essential (primary) hypertension: Secondary | ICD-10-CM | POA: Diagnosis not present

## 2020-11-04 DIAGNOSIS — A0839 Other viral enteritis: Secondary | ICD-10-CM | POA: Diagnosis not present

## 2020-11-04 DIAGNOSIS — E1140 Type 2 diabetes mellitus with diabetic neuropathy, unspecified: Secondary | ICD-10-CM | POA: Diagnosis not present

## 2020-11-07 ENCOUNTER — Telehealth: Payer: Self-pay | Admitting: Family Medicine

## 2020-11-07 ENCOUNTER — Ambulatory Visit: Payer: Medicare Other | Admitting: Family Medicine

## 2020-11-07 ENCOUNTER — Ambulatory Visit (INDEPENDENT_AMBULATORY_CARE_PROVIDER_SITE_OTHER): Payer: Medicare Other

## 2020-11-07 ENCOUNTER — Telehealth: Payer: Self-pay

## 2020-11-07 VITALS — Wt 106.0 lb

## 2020-11-07 DIAGNOSIS — Z0000 Encounter for general adult medical examination without abnormal findings: Secondary | ICD-10-CM | POA: Diagnosis not present

## 2020-11-07 NOTE — Telephone Encounter (Signed)
Home Health Verbal Orders - Caller/Agency: Rebeca Alert [facility] Callback Number: [phone] secure  Requesting OT/PT/Skilled Nursing/Social Work/Speech Therapy: Nursing  Frequency: 1w for 1x, 2w for 2x, 1w for 6x, 2PRN

## 2020-11-07 NOTE — Patient Instructions (Signed)
Gabriela Robles , Thank you for taking time to come for your Medicare Wellness Visit. I appreciate your ongoing commitment to your health goals. Please review the following plan we discussed and let me know if I can assist you in the future.   Screening recommendations/referrals: Colonoscopy: not required Mammogram: not required Bone Density: due Recommended yearly ophthalmology/optometry visit for glaucoma screening and checkup Recommended yearly dental visit for hygiene and checkup  Vaccinations: Influenza vaccine: due Pneumococcal vaccine: completed 11/02/2019 Tdap vaccine: due Shingles vaccine:  discussed   Covid-19: will get once over covid infection  Advanced directives: Please bring a copy of your POA (Power of Attorney) and/or Living Will to your next appointment.   Conditions/risks identified: none  Next appointment: Follow up in one year for your annual wellness visit    Preventive Care 65 Years and Older, Female Preventive care refers to lifestyle choices and visits with your health care provider that can promote health and wellness. What does preventive care include?  A yearly physical exam. This is also called an annual well check.  Dental exams once or twice a year.  Routine eye exams. Ask your health care provider how often you should have your eyes checked.  Personal lifestyle choices, including:  Daily care of your teeth and gums.  Regular physical activity.  Eating a healthy diet.  Avoiding tobacco and drug use.  Limiting alcohol use.  Practicing safe sex.  Taking low-dose aspirin every day.  Taking vitamin and mineral supplements as recommended by your health care provider. What happens during an annual well check? The services and screenings done by your health care provider during your annual well check will depend on your age, overall health, lifestyle risk factors, and family history of disease. Counseling  Your health care provider may ask  you questions about your:  Alcohol use.  Tobacco use.  Drug use.  Emotional well-being.  Home and relationship well-being.  Sexual activity.  Eating habits.  History of falls.  Memory and ability to understand (cognition).  Work and work Astronomer.  Reproductive health. Screening  You may have the following tests or measurements:  Height, weight, and BMI.  Blood pressure.  Lipid and cholesterol levels. These may be checked every 5 years, or more frequently if you are over 33 years old.  Skin check.  Lung cancer screening. You may have this screening every year starting at age 54 if you have a 30-pack-year history of smoking and currently smoke or have quit within the past 15 years.  Fecal occult blood test (FOBT) of the stool. You may have this test every year starting at age 102.  Flexible sigmoidoscopy or colonoscopy. You may have a sigmoidoscopy every 5 years or a colonoscopy every 10 years starting at age 76.  Hepatitis C blood test.  Hepatitis B blood test.  Sexually transmitted disease (STD) testing.  Diabetes screening. This is done by checking your blood sugar (glucose) after you have not eaten for a while (fasting). You may have this done every 1-3 years.  Bone density scan. This is done to screen for osteoporosis. You may have this done starting at age 15.  Mammogram. This may be done every 1-2 years. Talk to your health care provider about how often you should have regular mammograms. Talk with your health care provider about your test results, treatment options, and if necessary, the need for more tests. Vaccines  Your health care provider may recommend certain vaccines, such as:  Influenza vaccine. This is  recommended every year.  Tetanus, diphtheria, and acellular pertussis (Tdap, Td) vaccine. You may need a Td booster every 10 years.  Zoster vaccine. You may need this after age 28.  Pneumococcal 13-valent conjugate (PCV13) vaccine. One dose  is recommended after age 78.  Pneumococcal polysaccharide (PPSV23) vaccine. One dose is recommended after age 44. Talk to your health care provider about which screenings and vaccines you need and how often you need them. This information is not intended to replace advice given to you by your health care provider. Make sure you discuss any questions you have with your health care provider. Document Released: 11/10/2015 Document Revised: 07/03/2016 Document Reviewed: 08/15/2015 Elsevier Interactive Patient Education  2017 [place] Prevention in the Home Falls can cause injuries. They can happen to people of all ages. There are many things you can do to make your home safe and to help prevent falls. What can I do on the outside of my home?  Regularly fix the edges of walkways and driveways and fix any cracks.  Remove anything that might make you trip as you walk through a door, such as a raised step or threshold.  Trim any bushes or trees on the path to your home.  Use bright outdoor lighting.  Clear any walking paths of anything that might make someone trip, such as rocks or tools.  Regularly check to see if handrails are loose or broken. Make sure that both sides of any steps have handrails.  Any raised decks and porches should have guardrails on the edges.  Have any leaves, snow, or ice cleared regularly.  Use sand or salt on walking paths during winter.  Clean up any spills in your garage right away. This includes oil or grease spills. What can I do in the bathroom?  Use night lights.  Install grab bars by the toilet and in the tub and shower. Do not use towel bars as grab bars.  Use non-skid mats or decals in the tub or shower.  If you need to sit down in the shower, use a plastic, non-slip stool.  Keep the floor dry. Clean up any water that spills on the floor as soon as it happens.  Remove soap buildup in the tub or shower regularly.  Attach bath mats  securely with double-sided non-slip rug tape.  Do not have throw rugs and other things on the floor that can make you trip. What can I do in the bedroom?  Use night lights.  Make sure that you have a light by your bed that is easy to reach.  Do not use any sheets or blankets that are too big for your bed. They should not hang down onto the floor.  Have a firm chair that has side arms. You can use this for support while you get dressed.  Do not have throw rugs and other things on the floor that can make you trip. What can I do in the kitchen?  Clean up any spills right away.  Avoid walking on wet floors.  Keep items that you use a lot in easy-to-reach places.  If you need to reach something above you, use a strong step stool that has a grab bar.  Keep electrical cords out of the way.  Do not use floor polish or wax that makes floors slippery. If you must use wax, use non-skid floor wax.  Do not have throw rugs and other things on the floor that can make you trip.  What can I do with my stairs?  Do not leave any items on the stairs.  Make sure that there are handrails on both sides of the stairs and use them. Fix handrails that are broken or loose. Make sure that handrails are as long as the stairways.  Check any carpeting to make sure that it is firmly attached to the stairs. Fix any carpet that is loose or worn.  Avoid having throw rugs at the top or bottom of the stairs. If you do have throw rugs, attach them to the floor with carpet tape.  Make sure that you have a light switch at the top of the stairs and the bottom of the stairs. If you do not have them, ask someone to add them for you. What else can I do to help prevent falls?  Wear shoes that:  Do not have high heels.  Have rubber bottoms.  Are comfortable and fit you well.  Are closed at the toe. Do not wear sandals.  If you use a stepladder:  Make sure that it is fully opened. Do not climb a closed  stepladder.  Make sure that both sides of the stepladder are locked into place.  Ask someone to hold it for you, if possible.  Clearly mark and make sure that you can see:  Any grab bars or handrails.  First and last steps.  Where the edge of each step is.  Use tools that help you move around (mobility aids) if they are needed. These include:  Canes.  Walkers.  Scooters.  Crutches.  Turn on the lights when you go into a dark area. Replace any light bulbs as soon as they burn out.  Set up your furniture so you have a clear path. Avoid moving your furniture around.  If any of your floors are uneven, fix them.  If there are any pets around you, be aware of where they are.  Review your medicines with your doctor. Some medicines can make you feel dizzy. This can increase your chance of falling. Ask your doctor what other things that you can do to help prevent falls. This information is not intended to replace advice given to you by your health care provider. Make sure you discuss any questions you have with your health care provider. Document Released: 08/10/2009 Document Revised: 03/21/2016 Document Reviewed: 11/18/2014 Elsevier Interactive Patient Education  2017 [employer].

## 2020-11-07 NOTE — Progress Notes (Signed)
I connected with Gabriela Robles today by telephone and verified that I am speaking with the correct person using two identifiers. Location patient: home Location provider: work Persons participating in the virtual visit: Raeley Gilmore, daughter Joycelyn Schmid LPN.   I discussed the limitations, risks, security and privacy concerns of performing an evaluation and management service by telephone and the availability of in person appointments. I also discussed with the patient that there may be a patient responsible charge related to this service. The patient expressed understanding and verbally consented to this telephonic visit.    Interactive audio and video telecommunications were attempted between this provider and patient, however failed, due to patient having technical difficulties OR patient did not have access to video capability.  We continued and completed visit with audio only.     Vital signs may be patient reported or missing.  Subjective:   Gabriela Robles is a 87 y.o. female who presents for Medicare Annual (Subsequent) preventive examination.  Review of Systems     Cardiac Risk Factors include: advanced age (>80men, >84 women);diabetes mellitus;hypertension;sedentary lifestyle     Objective:    Today's Vitals   11/07/20 1016  Weight: 106 lb (48.1 kg)   Body mass index is 21.41 kg/m.  Advanced Directives 11/07/2020 10/30/2020 05/12/2020 01/25/2020 01/05/2020 11/02/2019 10/06/2018  Does Patient Have a Medical Advance Directive? Yes No No No Unable to assess, patient is non-responsive or altered mental status No No  Type of Estate agent of [place];Living will - - - - - -  Copy of Healthcare Power of Attorney in Chart? No - copy requested - - - - - -  Would patient like information on creating a medical advance directive? - No - Patient declined;No - Guardian declined No - Patient declined - - - No - Patient declined    Current  Medications (verified) Outpatient Encounter Medications as of 11/07/2020  Medication Sig  . atorvastatin (LIPITOR) 20 MG tablet Take 1 tablet (20 mg total) by mouth daily.  . [facility] ASPIRIN ADULT LOW DOSE 81 MG chewable tablet Chew 81 mg by mouth daily.  Marland Kitchen gabapentin (NEURONTIN) 100 MG capsule Take by mouth.  Marland Kitchen guaiFENesin-dextromethorphan (ROBITUSSIN DM) 100-10 MG/5ML syrup Take 5 mLs by mouth every 6 (six) hours as needed for cough.  Marland Kitchen lisinopril (ZESTRIL) 10 MG tablet TAKE 1 TABLET BY MOUTH EVERY DAY (Patient taking differently: Take 10 mg by mouth daily.)  . metFORMIN (GLUCOPHAGE-XR) 500 MG 24 hr tablet Take 500 mg by mouth 2 (two) times daily with a meal.  . MULTIPLE VITAMINS-MINERALS ER PO Take by mouth.  . pantoprazole (PROTONIX) 40 MG tablet Take 1 tablet (40 mg total) by mouth 2 (two) times daily for 14 doses.  . predniSONE (DELTASONE) 20 MG tablet Take 1 tablet (20 mg total) by mouth daily with breakfast.  . VITRON-C 65-125 MG TABS TAKE 1 TABLET BY MOUTH EVERY DAY  . albuterol (VENTOLIN HFA) 108 (90 Base) MCG/ACT inhaler Inhale 2 puffs into the lungs every 4 (four) hours as needed for wheezing or shortness of breath. (Patient not taking: Reported on 11/07/2020)  . Omega-3 Fatty Acids (FISH OIL) 1000 MG CPDR Take by mouth. (Patient not taking: Reported on 11/07/2020)   No facility-administered encounter medications on file as of 11/07/2020.    Allergies (verified) Patient has no known allergies.   History: Past Medical History:  Diagnosis Date  . Acute ischemic left MCA stroke (HCC)   . Arthritis   . Diabetes  mellitus without complication (HCC)   . History of stroke 2017  . Hyperlipidemia   . Hypertension    Past Surgical History:  Procedure Laterality Date  . LIVER SURGERY     per her son   Family History  Problem Relation Age of Onset  . Cancer Daughter        bone cancer   Social History   Socioeconomic History  . Marital status: Widowed    Spouse name: Not on file   . Number of children: 10  . Years of education: Not on file  . Highest education level: Not on file  Occupational History  . Occupation: retired  Tobacco Use  . Smoking status: Never Smoker  . Smokeless tobacco: Never Used  Vaping Use  . Vaping Use: Never used  Substance and Sexual Activity  . Alcohol use: No    Alcohol/week: 0.0 standard drinks  . Drug use: No  . Sexual activity: Not Currently  Other Topics Concern  . Not on file  Social History Narrative   5 children living and 5 children deceased.    Social Determinants of Health   Financial Resource Strain: Low Risk   . Difficulty of Paying Living Expenses: Not hard at all  Food Insecurity: No Food Insecurity  . Worried About Programme researcher, broadcasting/film/video in the Last Year: Never true  . Ran Out of Food in the Last Year: Never true  Transportation Needs: No Transportation Needs  . Lack of Transportation (Medical): No  . Lack of Transportation (Non-Medical): No  Physical Activity: Inactive  . Days of Exercise per Week: 0 days  . Minutes of Exercise per Session: 0 min  Stress: No Stress Concern Present  . Feeling of Stress : Not at all  Social Connections: Not on file    Tobacco Counseling Counseling given: Not Answered   Clinical Intake:  Pre-visit preparation completed: Yes  Pain : No/denies pain     Nutritional Status: BMI of 19-24  Normal Nutritional Risks: None Diabetes: Yes     Diabetic? Yes Nutrition Risk Assessment:  Has the patient had any N/V/D within the last 2 months?  No  Does the patient have any non-healing wounds?  No  Has the patient had any unintentional weight loss or weight gain?  No   Diabetes:  Is the patient diabetic?  Yes  If diabetic, was a CBG obtained today?  No  Did the patient bring in their glucometer from home?  No  How often do you monitor your CBG's? Does not check.   Financial Strains and Diabetes Management:  Are you having any financial strains with the device, your  supplies or your medication? No .  Does the patient want to be seen by Chronic Care Management for management of their diabetes?  No  Would the patient like to be referred to a Nutritionist or for Diabetic Management?  No   Diabetic Exams:  Diabetic Eye Exam: Overdue for diabetic eye exam. Pt has been advised about the importance in completing this exam. Patient advised to call and schedule an eye exam. Diabetic Foot Exam: Completed 05/02/2020   Interpreter Needed?: Yes Interpreter Name: Mertha Finders (daughter)NAllen LPN  Information entered by :: NAllen LPN   Activities of Daily Living In your present state of health, do you have any difficulty performing the following activities: 11/07/2020 10/30/2020  Hearing? [place]  Vision? N N  Difficulty concentrating or making decisions? N N  Walking or climbing stairs? [place]  Dressing or bathing? Y N  Comment daughter helps -  Doing errands, shopping? Y N  Comment daughter takes to doctor -  [place] managerreparing Food and eating ? Y -  Comment daughter manages -  Using the Toilet? N -  In the past six months, have you accidently leaked urine? N -  Do you have problems with loss of bowel control? N -  Managing your Medications? Y -  Comment daughter manages -  Managing your Finances? Y -  Comment daughter manages -  Housekeeping or managing your Housekeeping? Y -  Comment daughter manages -  Some recent data might be hidden    Patient Care Team: Smitty CordsKaramalegos, Alexander J, DO as PCP - General (Family Medicine)  Indicate any recent Medical Services you may have received from other than [facility] providers in the past year (date may be approximate).     Assessment:   This is a routine wellness examination for [place].  Hearing/Vision screen  Hearing Screening   125Hz  250Hz  500Hz  1000Hz  2000Hz  3000Hz  4000Hz  6000Hz  8000Hz   Right ear:           Left ear:           Vision Screening Comments: No regular eye exams  Dietary issues and exercise activities  discussed: Current Exercise Habits: The patient does not participate in regular exercise at present  Goals    . DIET - INCREASE WATER INTAKE     Recommend drinking at least 6-8 glasses of water a day     . Patient Stated     11/07/2020, no goals      Depression Screen PHQ 2/9 Scores 11/07/2020 05/02/2020 01/12/2020 11/02/2019 10/06/2018 10/06/2018 04/03/2018  PHQ - 2 Score 0 0 0 0 0 0 0    Fall Risk Fall Risk  11/07/2020 05/02/2020 01/12/2020 11/02/2019 10/06/2018  Falls in the past year? 0 0 0 0 0  Number falls in past yr: - 0 0 0 -  Injury with Fall? - 0 0 0 -  Risk for fall due to : Medication side effect - - - -  Follow up Falls evaluation completed;Education provided;Falls prevention discussed Falls evaluation completed Falls evaluation completed - -    FALL RISK PREVENTION PERTAINING TO THE HOME:  Any stairs in or around the home? No  If so, are there any without handrails? n/a Home free of loose throw rugs in walkways, pet beds, electrical cords, etc? Yes  Adequate lighting in your home to reduce risk of falls? Yes   ASSISTIVE DEVICES UTILIZED TO PREVENT FALLS:  Life alert? No  Use of a cane, walker or w/c? Yes  Grab bars in the bathroom? Yes  Shower chair or bench in shower? No  Elevated toilet seat or a handicapped toilet? No   TIMED UP AND GO:  Was the test performed? No . .    Cognitive Function:        Immunizations Immunization History  Administered Date(s) Administered  . Fluad Quad(high Dose 65+) 09/29/2019  . Influenza, High Dose Seasonal PF 07/24/2015, 08/18/2018  . Influenza-Unspecified 08/22/2017  . Pneumococcal Conjugate-13 10/06/2018  . Pneumococcal Polysaccharide-23 11/02/2019    TDAP status: Due, Education has been provided regarding the importance of this vaccine. Advised may receive this vaccine at local pharmacy or Health Dept. Aware to provide a copy of the vaccination record if obtained from local pharmacy or Health Dept. Verbalized acceptance  and understanding.  Flu Vaccine status: Due, Education has been provided regarding the importance of  this vaccine. Advised may receive this vaccine at local pharmacy or Health Dept. Aware to provide a copy of the vaccination record if obtained from local pharmacy or Health Dept. Verbalized acceptance and understanding.  Pneumococcal vaccine status: Up to date  Covid-19 vaccine status: Currently has covid, due for shots after gets better  Qualifies for Shingles Vaccine? Yes   Zostavax completed No   Shingrix Completed?: No.    Education has been provided regarding the importance of this vaccine. Patient has been advised to call insurance company to determine out of pocket expense if they have not yet received this vaccine. Advised may also receive vaccine at local pharmacy or Health Dept. Verbalized acceptance and understanding.  Screening Tests Health Maintenance  Topic Date Due  . OPHTHALMOLOGY EXAM  Never done  . COVID-19 Vaccine (1) Never done  . TETANUS/TDAP  Never done  . DEXA SCAN  Never done  . INFLUENZA VACCINE  05/28/2020  . HEMOGLOBIN A1C  04/29/2021  . FOOT EXAM  05/02/2021  . PNA vac Low Risk Adult  Completed    Health Maintenance  Health Maintenance Due  Topic Date Due  . OPHTHALMOLOGY EXAM  Never done  . COVID-19 Vaccine (1) Never done  . TETANUS/TDAP  Never done  . DEXA SCAN  Never done  . INFLUENZA VACCINE  05/28/2020    Colorectal cancer screening: No longer required.   Mammogram status: No longer required due to age.  Bone Density status: due  Lung Cancer Screening: (Low Dose CT Chest recommended if Age 2-80 years, 30 pack-year currently smoking OR have quit w/in 15years.) does not qualify.   Lung Cancer Screening Referral: no  Additional Screening:  Hepatitis C Screening: does not qualify;   Vision Screening: Recommended annual ophthalmology exams for early detection of glaucoma and other disorders of the eye. Is the patient up to date with their  annual eye exam?  No  Who is the provider or what is the name of the office in which the patient attends annual eye exams? none If pt is not established with a provider, would they like to be referred to a provider to establish care? No .   Dental Screening: Recommended annual dental exams for proper oral hygiene  Community Resource Referral / Chronic Care Management: CRR required this visit?  No   CCM required this visit?  No      Plan:     I have personally reviewed and noted the following in the patient's chart:   . Medical and social history . Use of alcohol, tobacco or illicit drugs  . Current medications and supplements . Functional ability and status . Nutritional status . Physical activity . Advanced directives . List of other physicians . Hospitalizations, surgeries, and ER visits in previous 12 months . Vitals . Screenings to include cognitive, depression, and falls . Referrals and appointments  In addition, I have reviewed and discussed with patient certain preventive protocols, quality metrics, and best practice recommendations. A written personalized care plan for preventive services as well as general preventive health recommendations were provided to patient.     Barb Merino, LPN   [id number]   Nurse Notes: 6 CIT not administered due to language barrier.

## 2020-11-07 NOTE — Telephone Encounter (Signed)
Patient's daughter consented to telehealth visit.

## 2020-11-08 ENCOUNTER — Telehealth: Payer: Self-pay | Admitting: Family Medicine

## 2020-11-08 DIAGNOSIS — A0839 Other viral enteritis: Secondary | ICD-10-CM | POA: Diagnosis not present

## 2020-11-08 DIAGNOSIS — U071 COVID-19: Secondary | ICD-10-CM | POA: Diagnosis not present

## 2020-11-08 DIAGNOSIS — I10 Essential (primary) hypertension: Secondary | ICD-10-CM | POA: Diagnosis not present

## 2020-11-08 DIAGNOSIS — E1140 Type 2 diabetes mellitus with diabetic neuropathy, unspecified: Secondary | ICD-10-CM | POA: Diagnosis not present

## 2020-11-08 DIAGNOSIS — J1282 Pneumonia due to coronavirus disease 2019: Secondary | ICD-10-CM | POA: Diagnosis not present

## 2020-11-08 DIAGNOSIS — E1165 Type 2 diabetes mellitus with hyperglycemia: Secondary | ICD-10-CM | POA: Diagnosis not present

## 2020-11-08 NOTE — Telephone Encounter (Signed)
Would you be able to call the Home Health agency back and give verbal authorization to proceed with these requested orders?  Thank you  Saralyn Pilar, DO [facility] 11/08/2020, 10:39 AM

## 2020-11-08 NOTE — Telephone Encounter (Signed)
Home Health Verbal Orders - Caller/Agency: [facility] Callback Number: [phone] Requesting  PT Frequency: 1x7 starting in two weeks 11/22/20

## 2020-11-11 DIAGNOSIS — I10 Essential (primary) hypertension: Secondary | ICD-10-CM | POA: Diagnosis not present

## 2020-11-11 DIAGNOSIS — J1282 Pneumonia due to coronavirus disease 2019: Secondary | ICD-10-CM | POA: Diagnosis not present

## 2020-11-11 DIAGNOSIS — A0839 Other viral enteritis: Secondary | ICD-10-CM | POA: Diagnosis not present

## 2020-11-11 DIAGNOSIS — E1140 Type 2 diabetes mellitus with diabetic neuropathy, unspecified: Secondary | ICD-10-CM | POA: Diagnosis not present

## 2020-11-11 DIAGNOSIS — U071 COVID-19: Secondary | ICD-10-CM | POA: Diagnosis not present

## 2020-11-11 DIAGNOSIS — E1165 Type 2 diabetes mellitus with hyperglycemia: Secondary | ICD-10-CM | POA: Diagnosis not present

## 2020-11-14 ENCOUNTER — Other Ambulatory Visit: Payer: Self-pay

## 2020-11-14 ENCOUNTER — Telehealth: Payer: Self-pay | Admitting: Family Medicine

## 2020-11-14 DIAGNOSIS — J1282 Pneumonia due to coronavirus disease 2019: Secondary | ICD-10-CM | POA: Diagnosis not present

## 2020-11-14 DIAGNOSIS — I10 Essential (primary) hypertension: Secondary | ICD-10-CM

## 2020-11-14 DIAGNOSIS — E1140 Type 2 diabetes mellitus with diabetic neuropathy, unspecified: Secondary | ICD-10-CM | POA: Diagnosis not present

## 2020-11-14 DIAGNOSIS — U071 COVID-19: Secondary | ICD-10-CM | POA: Diagnosis not present

## 2020-11-14 DIAGNOSIS — A0839 Other viral enteritis: Secondary | ICD-10-CM | POA: Diagnosis not present

## 2020-11-14 DIAGNOSIS — E1165 Type 2 diabetes mellitus with hyperglycemia: Secondary | ICD-10-CM | POA: Diagnosis not present

## 2020-11-14 MED ORDER — PANTOPRAZOLE SODIUM 40 MG PO TBEC
40.0000 mg | DELAYED_RELEASE_TABLET | Freq: Two times a day (BID) | ORAL | 3 refills | Status: DC
Start: 2020-11-14 — End: 2023-04-17

## 2020-11-14 MED ORDER — GABAPENTIN 100 MG PO CAPS: 100.0000 mg | ORAL_CAPSULE | Freq: Two times a day (BID) | ORAL | 3 refills | Status: DC

## 2020-11-14 NOTE — Telephone Encounter (Signed)
Yes that is no problem.  Can you clarify if they need a new order Pantoprazole? Chart says it was 40mg  twice a day previously ordered.  If she has worsening problem or acute worse problem, may need more urgent evaluation at hospital or urgent care, or if persistent issue or improved and wants to follow-up can schedule w/ me.  , DO [facility] 11/14/2020, 2:29 PM

## 2020-11-14 NOTE — Telephone Encounter (Signed)
I called Sonja and left a message for her to return my call.

## 2020-11-14 NOTE — Telephone Encounter (Signed)
Patient's nurse Celine Mans) would like advice from PCP (Dr. Althea Charon) Patient has began experiencing abdominal pain and been unable to eat since Friday 11/10/20 Patient and nurse would like to begin using pantoprazole (PROTONIX) 40 MG again if that's agreeable with PCP

## 2020-11-15 DIAGNOSIS — J1282 Pneumonia due to coronavirus disease 2019: Secondary | ICD-10-CM | POA: Diagnosis not present

## 2020-11-15 DIAGNOSIS — I10 Essential (primary) hypertension: Secondary | ICD-10-CM | POA: Diagnosis not present

## 2020-11-15 DIAGNOSIS — U071 COVID-19: Secondary | ICD-10-CM | POA: Diagnosis not present

## 2020-11-15 DIAGNOSIS — E1165 Type 2 diabetes mellitus with hyperglycemia: Secondary | ICD-10-CM | POA: Diagnosis not present

## 2020-11-15 DIAGNOSIS — A0839 Other viral enteritis: Secondary | ICD-10-CM | POA: Diagnosis not present

## 2020-11-15 DIAGNOSIS — E1140 Type 2 diabetes mellitus with diabetic neuropathy, unspecified: Secondary | ICD-10-CM | POA: Diagnosis not present

## 2020-11-16 DIAGNOSIS — I10 Essential (primary) hypertension: Secondary | ICD-10-CM | POA: Diagnosis not present

## 2020-11-16 DIAGNOSIS — E1140 Type 2 diabetes mellitus with diabetic neuropathy, unspecified: Secondary | ICD-10-CM | POA: Diagnosis not present

## 2020-11-16 DIAGNOSIS — U071 COVID-19: Secondary | ICD-10-CM | POA: Diagnosis not present

## 2020-11-16 DIAGNOSIS — A0839 Other viral enteritis: Secondary | ICD-10-CM | POA: Diagnosis not present

## 2020-11-16 DIAGNOSIS — E1165 Type 2 diabetes mellitus with hyperglycemia: Secondary | ICD-10-CM | POA: Diagnosis not present

## 2020-11-16 DIAGNOSIS — J1282 Pneumonia due to coronavirus disease 2019: Secondary | ICD-10-CM | POA: Diagnosis not present

## 2020-11-17 DIAGNOSIS — E1140 Type 2 diabetes mellitus with diabetic neuropathy, unspecified: Secondary | ICD-10-CM | POA: Diagnosis not present

## 2020-11-17 DIAGNOSIS — E1165 Type 2 diabetes mellitus with hyperglycemia: Secondary | ICD-10-CM | POA: Diagnosis not present

## 2020-11-17 DIAGNOSIS — I10 Essential (primary) hypertension: Secondary | ICD-10-CM | POA: Diagnosis not present

## 2020-11-17 DIAGNOSIS — A0839 Other viral enteritis: Secondary | ICD-10-CM | POA: Diagnosis not present

## 2020-11-17 DIAGNOSIS — J1282 Pneumonia due to coronavirus disease 2019: Secondary | ICD-10-CM | POA: Diagnosis not present

## 2020-11-17 DIAGNOSIS — U071 COVID-19: Secondary | ICD-10-CM | POA: Diagnosis not present

## 2020-11-20 DIAGNOSIS — E1165 Type 2 diabetes mellitus with hyperglycemia: Secondary | ICD-10-CM | POA: Diagnosis not present

## 2020-11-20 DIAGNOSIS — J1282 Pneumonia due to coronavirus disease 2019: Secondary | ICD-10-CM | POA: Diagnosis not present

## 2020-11-20 DIAGNOSIS — E1140 Type 2 diabetes mellitus with diabetic neuropathy, unspecified: Secondary | ICD-10-CM | POA: Diagnosis not present

## 2020-11-20 DIAGNOSIS — I10 Essential (primary) hypertension: Secondary | ICD-10-CM | POA: Diagnosis not present

## 2020-11-20 DIAGNOSIS — U071 COVID-19: Secondary | ICD-10-CM | POA: Diagnosis not present

## 2020-11-20 DIAGNOSIS — A0839 Other viral enteritis: Secondary | ICD-10-CM | POA: Diagnosis not present

## 2020-11-21 ENCOUNTER — Other Ambulatory Visit: Payer: Self-pay

## 2020-11-21 ENCOUNTER — Ambulatory Visit (INDEPENDENT_AMBULATORY_CARE_PROVIDER_SITE_OTHER): Payer: Medicare Other | Admitting: Family Medicine

## 2020-11-21 ENCOUNTER — Encounter: Payer: Self-pay | Admitting: Family Medicine

## 2020-11-21 VITALS — BP 107/88 | HR 75 | Ht <= 58 in | Wt 105.0 lb

## 2020-11-21 DIAGNOSIS — J1282 Pneumonia due to coronavirus disease 2019: Secondary | ICD-10-CM | POA: Diagnosis not present

## 2020-11-21 DIAGNOSIS — U071 COVID-19: Secondary | ICD-10-CM

## 2020-11-21 NOTE — Progress Notes (Signed)
Subjective:    Patient ID: Gabriela Robles, female    DOB: May 12, 1929, 87 y.o.   MRN: [id number]  Gabriela Robles is a 87 y.o. female presenting on 11/21/2020 for Hospitalization Follow-up   HPI  HOSPITAL FOLLOW-UP VISIT  Hospital/Location: [facility] Date of Admission: 10/29/20 Date of Discharge: 11/02/20 Transitions of care telephone call: Completed by Kellie Simmering LPN on 12/01/80  Reason for Admission: COVID / Pneumonia Primary (+Secondary) Diagnosis: COVID / Pneumonia  - Hospital H&P and Discharge Summary have been reviewed - Patient presents today 19 days after recent hospitalization. Brief summary of recent course, patient had symptoms of fever cough dyspnea nausea vomiting, hospitalized, confirmed COVID19  treated in hospital. No IV antibody infusion received on documentation. Also dx with pneumonia complication.  - Today reports overall has done well after discharge. Symptoms of COVID cough, breathing, nausea have resolved. Daughter admits that she is feeling fatigued and tired, and sleeping more than usual, but she is gradually improving. Very rare cough. Her appetite has improved, she is eating / drinking better now.  - New medications on discharge: Albuterol Inhaler PRN - no longer using. Prednisone 64m daily x 7 days, and Cough Syrup rx - now has completed these medications.  - Changes to current meds on discharge: none  I have reviewed the discharge medication list, and have reconciled the current and discharge medications today.   Current Outpatient Medications:  .  atorvastatin (LIPITOR) 20 MG tablet, Take 1 tablet (20 mg total) by mouth daily., Disp: 30 tablet, Rfl: 0 .  [facility] ASPIRIN ADULT LOW DOSE 81 MG chewable tablet, Chew 81 mg by mouth daily., Disp: , Rfl: 0 .  gabapentin (NEURONTIN) 100 MG capsule, Take 1 capsule (100 mg total) by mouth 2 (two) times daily., Disp: 60 capsule, Rfl: 3 .  lisinopril (ZESTRIL) 10 MG tablet, TAKE 1 TABLET BY MOUTH EVERY DAY (Patient  taking differently: Take 10 mg by mouth daily.), Disp: 90 tablet, Rfl: 0 .  metFORMIN (GLUCOPHAGE-XR) 500 MG 24 hr tablet, Take 500 mg by mouth 2 (two) times daily with a meal., Disp: , Rfl:  .  MULTIPLE VITAMINS-MINERALS ER PO, Take by mouth., Disp: , Rfl:  .  pantoprazole (PROTONIX) 40 MG tablet, Take 1 tablet (40 mg total) by mouth 2 (two) times daily., Disp: 60 tablet, Rfl: 3 .  VITRON-C 65-125 MG TABS, TAKE 1 TABLET BY MOUTH EVERY DAY, Disp: 30 tablet, Rfl: 0 .  Omega-3 Fatty Acids (FISH OIL) 1000 MG CPDR, Take by mouth. (Patient not taking: No sig reported), Disp: , Rfl:   ------------------------------------------------------------------------- Social History   Tobacco Use  . Smoking status: Never Smoker  . Smokeless tobacco: Never Used  Vaping Use  . Vaping Use: Never used  Substance Use Topics  . Alcohol use: No    Alcohol/week: 0.0 standard drinks  . Drug use: No    Review of Systems Per HPI unless specifically indicated above     Objective:    BP 107/88   Pulse 75   Ht '4\' 10"'  (1.473 m)   Wt 105 lb (47.6 kg)   BMI 21.95 kg/m   Wt Readings from Last 3 Encounters:  11/21/20 105 lb (47.6 kg)  11/07/20 106 lb (48.1 kg)  10/29/20 114 lb 3.2 oz (51.8 kg)    Physical Exam Vitals and nursing note reviewed.  Constitutional:      General: She is not in acute distress.    Appearance: She is well-developed and well-nourished. She is not  diaphoretic.     Comments: Well-appearing, comfortable, cooperative  HENT:     Head: Normocephalic and atraumatic.     Mouth/Throat:     Mouth: Oropharynx is clear and moist.  Eyes:     General:        Right eye: No discharge.        Left eye: No discharge.     Conjunctiva/sclera: Conjunctivae normal.  Neck:     Thyroid: No thyromegaly.  Cardiovascular:     Rate and Rhythm: Normal rate and regular rhythm.     Pulses: Intact distal pulses.     Heart sounds: Normal heart sounds. No murmur heard.   Pulmonary:     Effort:  Pulmonary effort is normal. No respiratory distress.     Breath sounds: Normal breath sounds. No wheezing or rales.  Musculoskeletal:        General: No edema. Normal range of motion.     Cervical back: Normal range of motion and neck supple.  Lymphadenopathy:     Cervical: No cervical adenopathy.  Skin:    General: Skin is warm and dry.     Findings: No erythema or rash.  Neurological:     Mental Status: She is alert and oriented to person, place, and time.  Psychiatric:        Mood and Affect: Mood and affect normal.        Behavior: Behavior normal.     Comments: Well groomed, good eye contact, normal speech and thoughts        Results for orders placed or performed during the hospital encounter of 10/29/20  Blood Culture (routine x 2)   Specimen: BLOOD  Result Value Ref Range   Specimen Description BLOOD BLOOD LEFT FOREARM    Special Requests      BOTTLES DRAWN AEROBIC AND ANAEROBIC Blood Culture results may not be optimal due to an inadequate volume of blood received in culture bottles   Culture      NO GROWTH 5 DAYS Performed at [facility], [address] [place], [place] [address]    Report Status 11/03/2020 FINAL   Blood Culture (routine x 2)   Specimen: BLOOD  Result Value Ref Range   Specimen Description BLOOD BLOOD LEFT FOREARM    Special Requests      BOTTLES DRAWN AEROBIC AND ANAEROBIC Blood Culture adequate volume   Culture      NO GROWTH 5 DAYS Performed at [facility], [address] [place], [place] [address]    Report Status 11/03/2020 FINAL   Basic metabolic panel  Result Value Ref Range   Sodium 130 (L) 135 - 145 mmol/L   Potassium 3.4 (L) 3.5 - 5.1 mmol/L   Chloride 97 (L) 98 - 111 mmol/L   CO2 21 (L) 22 - 32 mmol/L   Glucose, Bld 131 (H) 70 - 99 mg/dL   BUN 21 8 - 23 mg/dL   Creatinine, Ser 1.01 (H) 0.44 - 1.00 mg/dL   Calcium 8.4 (L) 8.9 - 10.3 mg/dL   GFR, Estimated 53 (L) >60 mL/min   Anion gap 12 5 - 15  CBC   Result Value Ref Range   WBC 5.4 4.0 - 10.5 K/uL   RBC 5.22 (H) 3.87 - 5.11 MIL/uL   Hemoglobin 10.1 (L) 12.0 - 15.0 g/dL   HCT 32.3 (L) 36.0 - 46.0 %   MCV 61.9 (L) 80.0 - 100.0 fL   MCH 19.3 (L) 26.0 - 34.0 pg  MCHC 31.3 30.0 - 36.0 g/dL   RDW 15.9 (H) 11.5 - 15.5 %   Platelets 177 150 - 400 K/uL   nRBC 0.0 0.0 - 0.2 %  Urinalysis, Complete w Microscopic  Result Value Ref Range   Color, Urine YELLOW (A) YELLOW   APPearance CLOUDY (A) CLEAR   Specific Gravity, Urine 1.028 1.005 - 1.030   pH 5.0 5.0 - 8.0   Glucose, UA NEGATIVE NEGATIVE mg/dL   Hgb urine dipstick LARGE (A) NEGATIVE   Bilirubin Urine NEGATIVE NEGATIVE   Ketones, ur 5 (A) NEGATIVE mg/dL   Protein, ur NEGATIVE NEGATIVE mg/dL   Nitrite POSITIVE (A) NEGATIVE   Leukocytes,Ua LARGE (A) NEGATIVE   RBC / HPF 21-50 0 - 5 RBC/hpf   WBC, UA >50 (H) 0 - 5 WBC/hpf   Bacteria, UA MANY (A) NONE SEEN   Squamous Epithelial / LPF 0-5 0 - 5   Hyaline Casts, UA PRESENT   Hepatic function panel  Result Value Ref Range   Total Protein 7.0 6.5 - 8.1 g/dL   Albumin 3.2 (L) 3.5 - 5.0 g/dL   AST 26 15 - 41 U/L   ALT 14 0 - 44 U/L   Alkaline Phosphatase 53 38 - 126 U/L   Total Bilirubin 0.6 0.3 - 1.2 mg/dL   Bilirubin, Direct <0.1 0.0 - 0.2 mg/dL   Indirect Bilirubin NOT CALCULATED 0.3 - 0.9 mg/dL  Lactic acid, plasma  Result Value Ref Range   Lactic Acid, Venous 1.7 0.5 - 1.9 mmol/L  Lactic acid, plasma  Result Value Ref Range   Lactic Acid, Venous 1.6 0.5 - 1.9 mmol/L  Procalcitonin  Result Value Ref Range   Procalcitonin <0.10 ng/mL  Triglycerides  Result Value Ref Range   Triglycerides 127 <150 mg/dL  Fibrinogen  Result Value Ref Range   Fibrinogen 527 (H) 210 - 475 mg/dL  Hemoglobin A1c  Result Value Ref Range   Hgb A1c MFr Bld 7.0 (H) 4.8 - 5.6 %   Mean Plasma Glucose 154.2 mg/dL  Lipid panel  Result Value Ref Range   Cholesterol 149 0 - 200 mg/dL   Triglycerides 81 <150 mg/dL   HDL 36 (L) >40 mg/dL    Total CHOL/HDL Ratio 4.1 RATIO   VLDL 16 0 - 40 mg/dL   LDL Cholesterol 97 0 - 99 mg/dL  Ferritin  Result Value Ref Range   Ferritin 849 (H) 11 - 307 ng/mL  Lactate dehydrogenase  Result Value Ref Range   LDH 227 (H) 98 - 192 U/L  Magnesium  Result Value Ref Range   Magnesium 1.9 1.7 - 2.4 mg/dL  C-reactive protein  Result Value Ref Range   CRP 17.5 (H) <1.0 mg/dL  Brain natriuretic peptide  Result Value Ref Range   B Natriuretic Peptide 80.9 0.0 - 100.0 pg/mL  Fibrin derivatives D-Dimer ([facility] only)  Result Value Ref Range   Fibrin derivatives D-dimer ([facility]) 735.69 (H) 0.00 - 499.00 ng/mL (FEU)  Hepatitis B surface antigen  Result Value Ref Range   Hepatitis B Surface Ag NON REACTIVE NON REACTIVE  Comprehensive metabolic panel  Result Value Ref Range   Sodium 137 135 - 145 mmol/L   Potassium 4.8 3.5 - 5.1 mmol/L   Chloride 110 98 - 111 mmol/L   CO2 19 (L) 22 - 32 mmol/L   Glucose, Bld 104 (H) 70 - 99 mg/dL   BUN 23 8 - 23 mg/dL   Creatinine, Ser 0.84 0.44 - 1.00 mg/dL  Calcium 7.8 (L) 8.9 - 10.3 mg/dL   Total Protein 6.2 (L) 6.5 - 8.1 g/dL   Albumin 2.7 (L) 3.5 - 5.0 g/dL   AST 119 (H) 15 - 41 U/L   ALT 32 0 - 44 U/L   Alkaline Phosphatase 177 (H) 38 - 126 U/L   Total Bilirubin 0.8 0.3 - 1.2 mg/dL   GFR, Estimated >60 >60 mL/min   Anion gap 8 5 - 15  C-reactive protein  Result Value Ref Range   CRP 17.4 (H) <1.0 mg/dL  Fibrin derivatives D-Dimer ([facility] only)  Result Value Ref Range   Fibrin derivatives D-dimer ([facility]) 709.76 (H) 0.00 - 499.00 ng/mL (FEU)  Ferritin  Result Value Ref Range   Ferritin 1,409 (H) 11 - 307 ng/mL  Magnesium  Result Value Ref Range   Magnesium 2.0 1.7 - 2.4 mg/dL  Glucose, capillary  Result Value Ref Range   Glucose-Capillary 131 (H) 70 - 99 mg/dL  Glucose, capillary  Result Value Ref Range   Glucose-Capillary 89 70 - 99 mg/dL  CBC with Differential/Platelet  Result Value Ref Range   WBC 4.3 4.0 - 10.5 K/uL   RBC 4.65 3.87 - 5.11  MIL/uL   Hemoglobin 9.2 (L) 12.0 - 15.0 g/dL   HCT 28.7 (L) 36.0 - 46.0 %   MCV 61.7 (L) 80.0 - 100.0 fL   MCH 19.8 (L) 26.0 - 34.0 pg   MCHC 32.1 30.0 - 36.0 g/dL   RDW 16.3 (H) 11.5 - 15.5 %   Platelets 178 150 - 400 K/uL   nRBC 0.0 0.0 - 0.2 %   Neutrophils Relative % 48 %   Neutro Abs 2.1 1.7 - 7.7 K/uL   Lymphocytes Relative 45 %   Lymphs Abs 2.0 0.7 - 4.0 K/uL   Monocytes Relative 6 %   Monocytes Absolute 0.3 0.1 - 1.0 K/uL   Eosinophils Relative 0 %   Eosinophils Absolute 0.0 0.0 - 0.5 K/uL   Basophils Relative 0 %   Basophils Absolute 0.0 0.0 - 0.1 K/uL   Immature Granulocytes 1 %   Abs Immature Granulocytes 0.03 0.00 - 0.07 K/uL  Glucose, capillary  Result Value Ref Range   Glucose-Capillary 74 70 - 99 mg/dL  Glucose, capillary  Result Value Ref Range   Glucose-Capillary 88 70 - 99 mg/dL  Comprehensive metabolic panel  Result Value Ref Range   Sodium 141 135 - 145 mmol/L   Potassium 4.8 3.5 - 5.1 mmol/L   Chloride 110 98 - 111 mmol/L   CO2 20 (L) 22 - 32 mmol/L   Glucose, Bld 178 (H) 70 - 99 mg/dL   BUN 28 (H) 8 - 23 mg/dL   Creatinine, Ser 0.93 0.44 - 1.00 mg/dL   Calcium 7.9 (L) 8.9 - 10.3 mg/dL   Total Protein 6.3 (L) 6.5 - 8.1 g/dL   Albumin 2.7 (L) 3.5 - 5.0 g/dL   AST 186 (H) 15 - 41 U/L   ALT 70 (H) 0 - 44 U/L   Alkaline Phosphatase 382 (H) 38 - 126 U/L   Total Bilirubin 1.4 (H) 0.3 - 1.2 mg/dL   GFR, Estimated 59 (L) >60 mL/min   Anion gap 11 5 - 15  C-reactive protein  Result Value Ref Range   CRP 15.5 (H) <1.0 mg/dL  Ferritin  Result Value Ref Range   Ferritin 1,783 (H) 11 - 307 ng/mL  Glucose, capillary  Result Value Ref Range   Glucose-Capillary 105 (H) 70 - 99 mg/dL  Glucose, capillary  Result Value Ref Range   Glucose-Capillary 191 (H) 70 - 99 mg/dL  D-dimer, quantitative (not at [facility])  Result Value Ref Range   D-Dimer, Quant 0.73 (H) 0.00 - 0.50 ug/mL-FEU  Glucose, capillary  Result Value Ref Range   Glucose-Capillary 159 (H) 70 -  99 mg/dL  Glucose, capillary  Result Value Ref Range   Glucose-Capillary 178 (H) 70 - 99 mg/dL  Comprehensive metabolic panel  Result Value Ref Range   Sodium 140 135 - 145 mmol/L   Potassium 4.4 3.5 - 5.1 mmol/L   Chloride 110 98 - 111 mmol/L   CO2 20 (L) 22 - 32 mmol/L   Glucose, Bld 213 (H) 70 - 99 mg/dL   BUN 37 (H) 8 - 23 mg/dL   Creatinine, Ser 1.13 (H) 0.44 - 1.00 mg/dL   Calcium 8.2 (L) 8.9 - 10.3 mg/dL   Total Protein 6.6 6.5 - 8.1 g/dL   Albumin 2.7 (L) 3.5 - 5.0 g/dL   AST 82 (H) 15 - 41 U/L   ALT 63 (H) 0 - 44 U/L   Alkaline Phosphatase 369 (H) 38 - 126 U/L   Total Bilirubin 1.0 0.3 - 1.2 mg/dL   GFR, Estimated 47 (L) >60 mL/min   Anion gap 10 5 - 15  C-reactive protein  Result Value Ref Range   CRP 9.6 (H) <1.0 mg/dL  Ferritin  Result Value Ref Range   Ferritin 2,026 (H) 11 - 307 ng/mL  Glucose, capillary  Result Value Ref Range   Glucose-Capillary 187 (H) 70 - 99 mg/dL  Glucose, capillary  Result Value Ref Range   Glucose-Capillary 211 (H) 70 - 99 mg/dL  D-dimer, quantitative (not at [facility])  Result Value Ref Range   D-Dimer, Quant 0.57 (H) 0.00 - 0.50 ug/mL-FEU  Glucose, capillary  Result Value Ref Range   Glucose-Capillary 211 (H) 70 - 99 mg/dL  Glucose, capillary  Result Value Ref Range   Glucose-Capillary 251 (H) 70 - 99 mg/dL  Glucose, capillary  Result Value Ref Range   Glucose-Capillary 237 (H) 70 - 99 mg/dL  C-reactive protein  Result Value Ref Range   CRP 5.9 (H) <1.0 mg/dL  Comprehensive metabolic panel  Result Value Ref Range   Sodium 142 135 - 145 mmol/L   Potassium 4.4 3.5 - 5.1 mmol/L   Chloride 110 98 - 111 mmol/L   CO2 23 22 - 32 mmol/L   Glucose, Bld 182 (H) 70 - 99 mg/dL   BUN 35 (H) 8 - 23 mg/dL   Creatinine, Ser 1.03 (H) 0.44 - 1.00 mg/dL   Calcium 8.4 (L) 8.9 - 10.3 mg/dL   Total Protein 6.4 (L) 6.5 - 8.1 g/dL   Albumin 2.8 (L) 3.5 - 5.0 g/dL   AST 82 (H) 15 - 41 U/L   ALT 56 (H) 0 - 44 U/L   Alkaline Phosphatase 330 (H)  38 - 126 U/L   Total Bilirubin 0.7 0.3 - 1.2 mg/dL   GFR, Estimated 52 (L) >60 mL/min   Anion gap 9 5 - 15  Ferritin  Result Value Ref Range   Ferritin 1,371 (H) 11 - 307 ng/mL  Magnesium  Result Value Ref Range   Magnesium 2.2 1.7 - 2.4 mg/dL  Phosphorus  Result Value Ref Range   Phosphorus 2.5 2.5 - 4.6 mg/dL  Lactate dehydrogenase  Result Value Ref Range   LDH 228 (H) 98 - 192 U/L  CBC with Differential/Platelet  Result Value Ref Range  WBC 5.2 4.0 - 10.5 K/uL   RBC 5.31 (H) 3.87 - 5.11 MIL/uL   Hemoglobin 10.4 (L) 12.0 - 15.0 g/dL   HCT 32.1 (L) 36.0 - 46.0 %   MCV 60.5 (L) 80.0 - 100.0 fL   MCH 19.6 (L) 26.0 - 34.0 pg   MCHC 32.4 30.0 - 36.0 g/dL   RDW 16.4 (H) 11.5 - 15.5 %   Platelets 337 150 - 400 K/uL   nRBC 0.0 0.0 - 0.2 %   Neutrophils Relative % 59 %   Neutro Abs 3.1 1.7 - 7.7 K/uL   Lymphocytes Relative 29 %   Lymphs Abs 1.5 0.7 - 4.0 K/uL   Monocytes Relative 10 %   Monocytes Absolute 0.5 0.1 - 1.0 K/uL   Eosinophils Relative 0 %   Eosinophils Absolute 0.0 0.0 - 0.5 K/uL   Basophils Relative 0 %   Basophils Absolute 0.0 0.0 - 0.1 K/uL   WBC Morphology MILD LEFT SHIFT (1-5% METAS, OCC MYELO, OCC BANDS)    Smear Review Normal platelet morphology    Immature Granulocytes 2 %   Abs Immature Granulocytes 0.08 (H) 0.00 - 0.07 K/uL  Vitamin B12  Result Value Ref Range   Vitamin B-12 1,980 (H) 180 - 914 pg/mL  Folate  Result Value Ref Range   Folate 21.4 >5.9 ng/mL  Iron and TIBC  Result Value Ref Range   Iron 111 28 - 170 ug/dL   TIBC 157 (L) 250 - 450 ug/dL   Saturation Ratios 71 (H) 10.4 - 31.8 %   UIBC 46 ug/dL  Reticulocytes  Result Value Ref Range   Retic Ct Pct 0.8 0.4 - 3.1 %   RBC. 5.20 (H) 3.87 - 5.11 MIL/uL   Retic Count, Absolute 43.2 19.0 - 186.0 K/uL   Immature Retic Fract 13.9 2.3 - 15.9 %  Haptoglobin  Result Value Ref Range   Haptoglobin 198 41 - 333 mg/dL  Glucose, capillary  Result Value Ref Range   Glucose-Capillary 253 (H)  70 - 99 mg/dL  Glucose, capillary  Result Value Ref Range   Glucose-Capillary 177 (H) 70 - 99 mg/dL  D-dimer, quantitative (not at [facility])  Result Value Ref Range   D-Dimer, Quant 0.41 0.00 - 0.50 ug/mL-FEU  POC SARS Coronavirus 2 Ag-ED - Nasal Swab (BD Veritor Kit)  Result Value Ref Range   SARS Coronavirus 2 Ag Positive (A) Negative  POC SARS Coronavirus 2 Ag-ED -  Result Value Ref Range   SARS Coronavirus 2 Ag POSITIVE (A) NEGATIVE  Troponin I (High Sensitivity)  Result Value Ref Range   Troponin I (High Sensitivity) 45 (H) <18 ng/L  Troponin I (High Sensitivity)  Result Value Ref Range   Troponin I (High Sensitivity) 47 (H) <18 ng/L  Troponin I (High Sensitivity)  Result Value Ref Range   Troponin I (High Sensitivity) 57 (H) <18 ng/L  Troponin I (High Sensitivity)  Result Value Ref Range   Troponin I (High Sensitivity) 66 (H) <18 ng/L      Assessment & Plan:   Problem List Items Addressed This Visit    Pneumonia due to COVID-19 virus - Primary     RESOLVED COVID Pneumonia Hospitalized 10/29/20 COVID Positive Now resolved, afebrile most symptoms resolved. Respiratory status back to baseline Hemodynamic stable Afebrile She has some lingering fatigue, post covid, gradual improving Tolerating PO well Encourage keep improving diet No new medication changes or new additions Follow-up as scheduled upcoming 1 month   No orders of the  defined types were placed in this encounter.   Follow up plan: Return in about 4 weeks (around 12/19/2020) for keep upcoming apt as scheduled.   Nobie Putnam, [facility] 11/21/2020, 9:25 AM

## 2020-11-21 NOTE — Patient Instructions (Addendum)
Thank you for coming to the office today.  Keep up the good work, tired and fatigue can take several weeks and months.  She is eligible to get covid vaccine anytime when ready, okay to wait 1-2 weeks until feeling better, she does not have to wait 3 months.  COVID Vaccine Information  You can get booster dose at [facility], [facility] - check their websites or call to schedule in advance. Appointment is required.  [place] COVID19 Vaccine Information  [email or web address]  Appointments are required. To register for your free vaccination appointment, click the link on the date on the calendar located on the website or call [phone].   Please schedule a Follow-up Appointment to: Return in about 4 weeks (around 12/19/2020) for keep upcoming apt as scheduled.  If you have any other questions or concerns, please feel free to call the office or send a message through MyChart. You may also schedule an earlier appointment if necessary.  Additionally, you may be receiving a survey about your experience at our office within a few days to 1 week by e-mail or mail. We value your feedback.  Saralyn Pilar, DO [facility], [place]

## 2020-11-22 DIAGNOSIS — I10 Essential (primary) hypertension: Secondary | ICD-10-CM | POA: Diagnosis not present

## 2020-11-22 DIAGNOSIS — U071 COVID-19: Secondary | ICD-10-CM | POA: Diagnosis not present

## 2020-11-22 DIAGNOSIS — A0839 Other viral enteritis: Secondary | ICD-10-CM | POA: Diagnosis not present

## 2020-11-22 DIAGNOSIS — E1140 Type 2 diabetes mellitus with diabetic neuropathy, unspecified: Secondary | ICD-10-CM | POA: Diagnosis not present

## 2020-11-22 DIAGNOSIS — E1165 Type 2 diabetes mellitus with hyperglycemia: Secondary | ICD-10-CM | POA: Diagnosis not present

## 2020-11-22 DIAGNOSIS — J1282 Pneumonia due to coronavirus disease 2019: Secondary | ICD-10-CM | POA: Diagnosis not present

## 2020-11-23 DIAGNOSIS — A0839 Other viral enteritis: Secondary | ICD-10-CM | POA: Diagnosis not present

## 2020-11-23 DIAGNOSIS — E1140 Type 2 diabetes mellitus with diabetic neuropathy, unspecified: Secondary | ICD-10-CM | POA: Diagnosis not present

## 2020-11-23 DIAGNOSIS — J1282 Pneumonia due to coronavirus disease 2019: Secondary | ICD-10-CM | POA: Diagnosis not present

## 2020-11-23 DIAGNOSIS — U071 COVID-19: Secondary | ICD-10-CM | POA: Diagnosis not present

## 2020-11-23 DIAGNOSIS — E1165 Type 2 diabetes mellitus with hyperglycemia: Secondary | ICD-10-CM | POA: Diagnosis not present

## 2020-11-23 DIAGNOSIS — I10 Essential (primary) hypertension: Secondary | ICD-10-CM | POA: Diagnosis not present

## 2020-11-27 DIAGNOSIS — A0839 Other viral enteritis: Secondary | ICD-10-CM | POA: Diagnosis not present

## 2020-11-27 DIAGNOSIS — I10 Essential (primary) hypertension: Secondary | ICD-10-CM | POA: Diagnosis not present

## 2020-11-27 DIAGNOSIS — E1140 Type 2 diabetes mellitus with diabetic neuropathy, unspecified: Secondary | ICD-10-CM | POA: Diagnosis not present

## 2020-11-27 DIAGNOSIS — U071 COVID-19: Secondary | ICD-10-CM | POA: Diagnosis not present

## 2020-11-27 DIAGNOSIS — J1282 Pneumonia due to coronavirus disease 2019: Secondary | ICD-10-CM | POA: Diagnosis not present

## 2020-11-27 DIAGNOSIS — E1165 Type 2 diabetes mellitus with hyperglycemia: Secondary | ICD-10-CM | POA: Diagnosis not present

## 2020-11-28 DIAGNOSIS — I10 Essential (primary) hypertension: Secondary | ICD-10-CM | POA: Diagnosis not present

## 2020-11-28 DIAGNOSIS — E1165 Type 2 diabetes mellitus with hyperglycemia: Secondary | ICD-10-CM | POA: Diagnosis not present

## 2020-11-28 DIAGNOSIS — J1282 Pneumonia due to coronavirus disease 2019: Secondary | ICD-10-CM | POA: Diagnosis not present

## 2020-11-28 DIAGNOSIS — E1140 Type 2 diabetes mellitus with diabetic neuropathy, unspecified: Secondary | ICD-10-CM | POA: Diagnosis not present

## 2020-11-28 DIAGNOSIS — A0839 Other viral enteritis: Secondary | ICD-10-CM | POA: Diagnosis not present

## 2020-11-28 DIAGNOSIS — U071 COVID-19: Secondary | ICD-10-CM | POA: Diagnosis not present

## 2020-11-30 DIAGNOSIS — I10 Essential (primary) hypertension: Secondary | ICD-10-CM | POA: Diagnosis not present

## 2020-11-30 DIAGNOSIS — U071 COVID-19: Secondary | ICD-10-CM | POA: Diagnosis not present

## 2020-11-30 DIAGNOSIS — A0839 Other viral enteritis: Secondary | ICD-10-CM | POA: Diagnosis not present

## 2020-11-30 DIAGNOSIS — J1282 Pneumonia due to coronavirus disease 2019: Secondary | ICD-10-CM | POA: Diagnosis not present

## 2020-11-30 DIAGNOSIS — E1140 Type 2 diabetes mellitus with diabetic neuropathy, unspecified: Secondary | ICD-10-CM | POA: Diagnosis not present

## 2020-11-30 DIAGNOSIS — E1165 Type 2 diabetes mellitus with hyperglycemia: Secondary | ICD-10-CM | POA: Diagnosis not present

## 2020-12-04 DIAGNOSIS — E1140 Type 2 diabetes mellitus with diabetic neuropathy, unspecified: Secondary | ICD-10-CM | POA: Diagnosis not present

## 2020-12-04 DIAGNOSIS — M1990 Unspecified osteoarthritis, unspecified site: Secondary | ICD-10-CM | POA: Diagnosis not present

## 2020-12-04 DIAGNOSIS — Z9181 History of falling: Secondary | ICD-10-CM | POA: Diagnosis not present

## 2020-12-04 DIAGNOSIS — Z8673 Personal history of transient ischemic attack (TIA), and cerebral infarction without residual deficits: Secondary | ICD-10-CM | POA: Diagnosis not present

## 2020-12-04 DIAGNOSIS — J1282 Pneumonia due to coronavirus disease 2019: Secondary | ICD-10-CM | POA: Diagnosis not present

## 2020-12-04 DIAGNOSIS — A0839 Other viral enteritis: Secondary | ICD-10-CM | POA: Diagnosis not present

## 2020-12-04 DIAGNOSIS — D509 Iron deficiency anemia, unspecified: Secondary | ICD-10-CM | POA: Diagnosis not present

## 2020-12-04 DIAGNOSIS — I700 Atherosclerosis of aorta: Secondary | ICD-10-CM | POA: Diagnosis not present

## 2020-12-04 DIAGNOSIS — M109 Gout, unspecified: Secondary | ICD-10-CM | POA: Diagnosis not present

## 2020-12-04 DIAGNOSIS — K219 Gastro-esophageal reflux disease without esophagitis: Secondary | ICD-10-CM | POA: Diagnosis not present

## 2020-12-04 DIAGNOSIS — Z7982 Long term (current) use of aspirin: Secondary | ICD-10-CM | POA: Diagnosis not present

## 2020-12-04 DIAGNOSIS — E785 Hyperlipidemia, unspecified: Secondary | ICD-10-CM | POA: Diagnosis not present

## 2020-12-04 DIAGNOSIS — U071 COVID-19: Secondary | ICD-10-CM | POA: Diagnosis not present

## 2020-12-04 DIAGNOSIS — I10 Essential (primary) hypertension: Secondary | ICD-10-CM | POA: Diagnosis not present

## 2020-12-04 DIAGNOSIS — E1165 Type 2 diabetes mellitus with hyperglycemia: Secondary | ICD-10-CM | POA: Diagnosis not present

## 2020-12-04 DIAGNOSIS — Z7984 Long term (current) use of oral hypoglycemic drugs: Secondary | ICD-10-CM | POA: Diagnosis not present

## 2020-12-06 DIAGNOSIS — A0839 Other viral enteritis: Secondary | ICD-10-CM | POA: Diagnosis not present

## 2020-12-06 DIAGNOSIS — I10 Essential (primary) hypertension: Secondary | ICD-10-CM | POA: Diagnosis not present

## 2020-12-06 DIAGNOSIS — E1165 Type 2 diabetes mellitus with hyperglycemia: Secondary | ICD-10-CM | POA: Diagnosis not present

## 2020-12-06 DIAGNOSIS — J1282 Pneumonia due to coronavirus disease 2019: Secondary | ICD-10-CM | POA: Diagnosis not present

## 2020-12-06 DIAGNOSIS — E1140 Type 2 diabetes mellitus with diabetic neuropathy, unspecified: Secondary | ICD-10-CM | POA: Diagnosis not present

## 2020-12-06 DIAGNOSIS — U071 COVID-19: Secondary | ICD-10-CM | POA: Diagnosis not present

## 2020-12-07 DIAGNOSIS — E1140 Type 2 diabetes mellitus with diabetic neuropathy, unspecified: Secondary | ICD-10-CM | POA: Diagnosis not present

## 2020-12-07 DIAGNOSIS — A0839 Other viral enteritis: Secondary | ICD-10-CM | POA: Diagnosis not present

## 2020-12-07 DIAGNOSIS — U071 COVID-19: Secondary | ICD-10-CM | POA: Diagnosis not present

## 2020-12-07 DIAGNOSIS — J1282 Pneumonia due to coronavirus disease 2019: Secondary | ICD-10-CM | POA: Diagnosis not present

## 2020-12-07 DIAGNOSIS — E1165 Type 2 diabetes mellitus with hyperglycemia: Secondary | ICD-10-CM | POA: Diagnosis not present

## 2020-12-07 DIAGNOSIS — I10 Essential (primary) hypertension: Secondary | ICD-10-CM | POA: Diagnosis not present

## 2020-12-14 DIAGNOSIS — I10 Essential (primary) hypertension: Secondary | ICD-10-CM | POA: Diagnosis not present

## 2020-12-14 DIAGNOSIS — U071 COVID-19: Secondary | ICD-10-CM | POA: Diagnosis not present

## 2020-12-14 DIAGNOSIS — J1282 Pneumonia due to coronavirus disease 2019: Secondary | ICD-10-CM | POA: Diagnosis not present

## 2020-12-14 DIAGNOSIS — A0839 Other viral enteritis: Secondary | ICD-10-CM | POA: Diagnosis not present

## 2020-12-14 DIAGNOSIS — E1165 Type 2 diabetes mellitus with hyperglycemia: Secondary | ICD-10-CM | POA: Diagnosis not present

## 2020-12-14 DIAGNOSIS — E1140 Type 2 diabetes mellitus with diabetic neuropathy, unspecified: Secondary | ICD-10-CM | POA: Diagnosis not present

## 2020-12-19 ENCOUNTER — Other Ambulatory Visit: Payer: Self-pay

## 2020-12-19 ENCOUNTER — Ambulatory Visit (INDEPENDENT_AMBULATORY_CARE_PROVIDER_SITE_OTHER): Payer: Medicare Other | Admitting: Family Medicine

## 2020-12-19 ENCOUNTER — Encounter: Payer: Self-pay | Admitting: Family Medicine

## 2020-12-19 VITALS — BP 145/40 | HR 73 | Temp 95.7°F | Ht <= 58 in | Wt 116.0 lb

## 2020-12-19 DIAGNOSIS — E1142 Type 2 diabetes mellitus with diabetic polyneuropathy: Secondary | ICD-10-CM

## 2020-12-19 DIAGNOSIS — I10 Essential (primary) hypertension: Secondary | ICD-10-CM

## 2020-12-19 DIAGNOSIS — E782 Mixed hyperlipidemia: Secondary | ICD-10-CM | POA: Diagnosis not present

## 2020-12-19 DIAGNOSIS — M1A09X0 Idiopathic chronic gout, multiple sites, without tophus (tophi): Secondary | ICD-10-CM

## 2020-12-19 MED ORDER — LISINOPRIL 10 MG PO TABS: 10.0000 mg | ORAL_TABLET | Freq: Every day | ORAL | 3 refills | Status: DC

## 2020-12-19 MED ORDER — ATORVASTATIN CALCIUM 20 MG PO TABS: 20.0000 mg | ORAL_TABLET | Freq: Every day | ORAL | 3 refills | Status: DC

## 2020-12-19 MED ORDER — TRAMADOL HCL 50 MG PO TABS: 50.0000 mg | ORAL_TABLET | Freq: Two times a day (BID) | ORAL | 1 refills | Status: DC | PRN

## 2020-12-19 MED ORDER — GABAPENTIN 100 MG PO CAPS: 100.0000 mg | ORAL_CAPSULE | Freq: Two times a day (BID) | ORAL | 3 refills | Status: DC

## 2020-12-19 NOTE — Progress Notes (Signed)
Subjective:    Patient ID: Gabriela Robles, female    DOB: 1929-04-17, 87 y.o.   MRN: [id number]  Cidney Robles is a 87 y.o. female presenting on 12/19/2020 for Diabetes and Hypertension   HPI   FOLLOW-UPCHRONIC HTN: - Today patient reportsdoing well.No BP checks outside officestill. Current Meds - Lisinopril 41m daily in AM  Tolerating well, w/o complaints. Continue ASA 822mfor risk reduction with prior CVA  Anemia, iron deficiency/ Fatigue: See prior notes for history of anemia and lab results. Has improved on last lab check Hgb No new acute complaints today, last seen Heme 01/2020. Has anemia of chronic disease, on MVI. - Regular exercises daily, active walks around house and outdoors ingarden, very functional -Denies any episodes of bleeding, sleepiness, syncope, chest pain, or dyspnea, poor appetite, constipation, abdominal pain  CHRONIC DM, Type 2 - Diet Controlled/ DM Neuropathy Last lab A1c was elevated 12/2019 [facility]. CBGs: No longer checking CBG. Meds: metformin XR 500 BID Currently on ACEi Diet isbalanced,some sweets still- appetite is good overall. Has not done DM Eye exam due to COVID concerns Denies hypoglycemia  PMH -ResidualRupper extremity weakness, from CVA Reports chronic weakness of Rgrip and upper extremity, without any recent changes  Follow-up Gout, R Foot / Ankle Gout flare in March 2021 - colder weather only Has Tramadol PRN for flare, needs refill for gout PRN Prior Uric acid elevated   [facility] 11/07/2020 05/02/2020 01/12/2020  Decreased Interest 0 0 0  Down, Depressed, Hopeless 0 0 0  PHQ - 2 Score 0 0 0    Past Medical History:  Diagnosis Date  . Acute ischemic left MCA stroke ([facility]  . Arthritis   . Diabetes mellitus without complication ([place]  . History of stroke 2017  . Hyperlipidemia   . Hypertension    Past Surgical History:   Procedure Laterality Date  . LIVER SURGERY     per her son   Social History   Socioeconomic History  . Marital status: Widowed    Spouse name: Not on file  . Number of children: 10  . Years of education: Not on file  . Highest education level: Not on file  Occupational History  . Occupation: retired  Tobacco Use  . Smoking status: Never Smoker  . Smokeless tobacco: Never Used  Vaping Use  . Vaping Use: Never used  Substance and Sexual Activity  . Alcohol use: No    Alcohol/week: 0.0 standard drinks  . Drug use: No  . Sexual activity: Not Currently  Other Topics Concern  . Not on file  Social History Narrative   5 children living and 5 children deceased.    Social Determinants of Health   Financial Resource Strain: Low Risk   . Difficulty of Paying Living Expenses: Not hard at all  Food Insecurity: No Food Insecurity  . Worried About RuCharity fundraisern the Last Year: Never true  . Ran Out of Food in the Last Year: Never true  Transportation Needs: No Transportation Needs  . Lack of Transportation (Medical): No  . Lack of Transportation (Non-Medical): No  Physical Activity: Inactive  . Days of Exercise per Week: 0 days  . Minutes of Exercise per Session: 0 min  Stress: No Stress Concern Present  . Feeling of Stress : Not at all  Social Connections: Not on file  Intimate Partner Violence: Not on file   Family History  Problem Relation Age of Onset  . Cancer Daughter        bone cancer   Current Outpatient Medications on File Prior to Visit  Medication Sig  . [facility] ASPIRIN ADULT LOW DOSE 81 MG chewable tablet Chew 81 mg by mouth daily.  . metFORMIN (GLUCOPHAGE-XR) 500 MG 24 hr tablet Take 500 mg by mouth 2 (two) times daily with a meal.  . pantoprazole (PROTONIX) 40 MG tablet Take 1 tablet (40 mg total) by mouth 2 (two) times daily.  Marland Kitchen VITRON-C 65-125 MG TABS TAKE 1 TABLET BY MOUTH EVERY DAY  . MULTIPLE VITAMINS-MINERALS ER PO Take by mouth. (Patient not  taking: Reported on 12/19/2020)  . Omega-3 Fatty Acids (FISH OIL) 1000 MG CPDR Take by mouth. (Patient not taking: No sig reported)   No current facility-administered medications on file prior to visit.    Review of Systems Per HPI unless specifically indicated above      Objective:    BP (!) 145/40 (BP Location: Right Arm, Patient Position: Sitting, Cuff Size: Normal)   Pulse 73   Temp (!) 95.7 F (35.4 C) (Temporal)   Ht _0  (1.473 m)   Wt 116 lb (52.6 kg)   SpO2 100%   BMI 24.24 kg/m   Wt Readings from Last 3 Encounters:  12/19/20 116 lb (52.6 kg)  11/21/20 105 lb (47.6 kg)  11/07/20 106 lb (48.1 kg)    Physical Exam Vitals and nursing note reviewed.  Constitutional:      General: She is not in acute distress.    Appearance: She is well-developed and well-nourished. She is not diaphoretic.     Comments: Well-appearing, comfortable, cooperative  HENT:     Head: Normocephalic and atraumatic.     Mouth/Throat:     Mouth: Oropharynx is clear and moist.  Eyes:     General:        Right eye: No discharge.        Left eye: No discharge.     Conjunctiva/sclera: Conjunctivae normal.  Neck:     Thyroid: No thyromegaly.  Cardiovascular:     Rate and Rhythm: Normal rate and regular rhythm.     Pulses: Intact distal pulses.     Heart sounds: Normal heart sounds. No murmur heard.   Pulmonary:     Effort: Pulmonary effort is normal. No respiratory distress.     Breath sounds: Normal breath sounds. No wheezing or rales.  Musculoskeletal:        General: No edema. Normal range of motion.     Cervical back: Normal range of motion and neck supple.     Comments: Right knee swollen, no erythema. Has good range of motion. Mild tender on exam.  Lymphadenopathy:     Cervical: No cervical adenopathy.  Skin:    General: Skin is warm and dry.     Findings: No erythema or rash.  Neurological:     Mental Status: She is alert and oriented to person, place, and time.   Psychiatric:        Mood and Affect: Mood and affect normal.        Behavior: Behavior normal.     Comments: Well groomed, good eye contact, normal speech and thoughts      Results for orders placed or performed during the hospital encounter of 10/29/20  Blood Culture (routine x 2)   Specimen: BLOOD  Result Value Ref Range   Specimen Description BLOOD BLOOD LEFT FOREARM  Special Requests      BOTTLES DRAWN AEROBIC AND ANAEROBIC Blood Culture results may not be optimal due to an inadequate volume of blood received in culture bottles   Culture      NO GROWTH 5 DAYS Performed at [facility], [address] [place], [place] [address]    Report Status 11/03/2020 FINAL   Blood Culture (routine x 2)   Specimen: BLOOD  Result Value Ref Range   Specimen Description BLOOD BLOOD LEFT FOREARM    Special Requests      BOTTLES DRAWN AEROBIC AND ANAEROBIC Blood Culture adequate volume   Culture      NO GROWTH 5 DAYS Performed at [facility], [address] [place], [place] [address]    Report Status 11/03/2020 FINAL   Basic metabolic panel  Result Value Ref Range   Sodium 130 (L) 135 - 145 mmol/L   Potassium 3.4 (L) 3.5 - 5.1 mmol/L   Chloride 97 (L) 98 - 111 mmol/L   CO2 21 (L) 22 - 32 mmol/L   Glucose, Bld 131 (H) 70 - 99 mg/dL   BUN 21 8 - 23 mg/dL   Creatinine, Ser 1.01 (H) 0.44 - 1.00 mg/dL   Calcium 8.4 (L) 8.9 - 10.3 mg/dL   GFR, Estimated 53 (L) >60 mL/min   Anion gap 12 5 - 15  CBC  Result Value Ref Range   WBC 5.4 4.0 - 10.5 K/uL   RBC 5.22 (H) 3.87 - 5.11 MIL/uL   Hemoglobin 10.1 (L) 12.0 - 15.0 g/dL   HCT 32.3 (L) 36.0 - 46.0 %   MCV 61.9 (L) 80.0 - 100.0 fL   MCH 19.3 (L) 26.0 - 34.0 pg   MCHC 31.3 30.0 - 36.0 g/dL   RDW 15.9 (H) 11.5 - 15.5 %   Platelets 177 150 - 400 K/uL   nRBC 0.0 0.0 - 0.2 %  Urinalysis, Complete w Microscopic  Result Value Ref Range   Color, Urine YELLOW (A) YELLOW   APPearance CLOUDY (A) CLEAR   Specific  Gravity, Urine 1.028 1.005 - 1.030   pH 5.0 5.0 - 8.0   Glucose, UA NEGATIVE NEGATIVE mg/dL   Hgb urine dipstick LARGE (A) NEGATIVE   Bilirubin Urine NEGATIVE NEGATIVE   Ketones, ur 5 (A) NEGATIVE mg/dL   Protein, ur NEGATIVE NEGATIVE mg/dL   Nitrite POSITIVE (A) NEGATIVE   Leukocytes,Ua LARGE (A) NEGATIVE   RBC / HPF 21-50 0 - 5 RBC/hpf   WBC, UA >50 (H) 0 - 5 WBC/hpf   Bacteria, UA MANY (A) NONE SEEN   Squamous Epithelial / LPF 0-5 0 - 5   Hyaline Casts, UA PRESENT   Hepatic function panel  Result Value Ref Range   Total Protein 7.0 6.5 - 8.1 g/dL   Albumin 3.2 (L) 3.5 - 5.0 g/dL   AST 26 15 - 41 U/L   ALT 14 0 - 44 U/L   Alkaline Phosphatase 53 38 - 126 U/L   Total Bilirubin 0.6 0.3 - 1.2 mg/dL   Bilirubin, Direct <0.1 0.0 - 0.2 mg/dL   Indirect Bilirubin NOT CALCULATED 0.3 - 0.9 mg/dL  Lactic acid, plasma  Result Value Ref Range   Lactic Acid, Venous 1.7 0.5 - 1.9 mmol/L  Lactic acid, plasma  Result Value Ref Range   Lactic Acid, Venous 1.6 0.5 - 1.9 mmol/L  Procalcitonin  Result Value Ref Range   Procalcitonin <0.10 ng/mL  Triglycerides  Result Value Ref Range   Triglycerides  127 <150 mg/dL  Fibrinogen  Result Value Ref Range   Fibrinogen 527 (H) 210 - 475 mg/dL  Hemoglobin A1c  Result Value Ref Range   Hgb A1c MFr Bld 7.0 (H) 4.8 - 5.6 %   Mean Plasma Glucose 154.2 mg/dL  Lipid panel  Result Value Ref Range   Cholesterol 149 0 - 200 mg/dL   Triglycerides 81 <150 mg/dL   HDL 36 (L) >40 mg/dL   Total CHOL/HDL Ratio 4.1 RATIO   VLDL 16 0 - 40 mg/dL   LDL Cholesterol 97 0 - 99 mg/dL  Ferritin  Result Value Ref Range   Ferritin 849 (H) 11 - 307 ng/mL  Lactate dehydrogenase  Result Value Ref Range   LDH 227 (H) 98 - 192 U/L  Magnesium  Result Value Ref Range   Magnesium 1.9 1.7 - 2.4 mg/dL  C-reactive protein  Result Value Ref Range   CRP 17.5 (H) <1.0 mg/dL  Brain natriuretic peptide  Result Value Ref Range   B Natriuretic Peptide 80.9 0.0 - 100.0  pg/mL  Fibrin derivatives D-Dimer ([facility] only)  Result Value Ref Range   Fibrin derivatives D-dimer ([facility]) 735.69 (H) 0.00 - 499.00 ng/mL (FEU)  Hepatitis B surface antigen  Result Value Ref Range   Hepatitis B Surface Ag NON REACTIVE NON REACTIVE  Comprehensive metabolic panel  Result Value Ref Range   Sodium 137 135 - 145 mmol/L   Potassium 4.8 3.5 - 5.1 mmol/L   Chloride 110 98 - 111 mmol/L   CO2 19 (L) 22 - 32 mmol/L   Glucose, Bld 104 (H) 70 - 99 mg/dL   BUN 23 8 - 23 mg/dL   Creatinine, Ser 0.84 0.44 - 1.00 mg/dL   Calcium 7.8 (L) 8.9 - 10.3 mg/dL   Total Protein 6.2 (L) 6.5 - 8.1 g/dL   Albumin 2.7 (L) 3.5 - 5.0 g/dL   AST 119 (H) 15 - 41 U/L   ALT 32 0 - 44 U/L   Alkaline Phosphatase 177 (H) 38 - 126 U/L   Total Bilirubin 0.8 0.3 - 1.2 mg/dL   GFR, Estimated >60 >60 mL/min   Anion gap 8 5 - 15  C-reactive protein  Result Value Ref Range   CRP 17.4 (H) <1.0 mg/dL  Fibrin derivatives D-Dimer ([facility] only)  Result Value Ref Range   Fibrin derivatives D-dimer ([facility]) 709.76 (H) 0.00 - 499.00 ng/mL (FEU)  Ferritin  Result Value Ref Range   Ferritin 1,409 (H) 11 - 307 ng/mL  Magnesium  Result Value Ref Range   Magnesium 2.0 1.7 - 2.4 mg/dL  Glucose, capillary  Result Value Ref Range   Glucose-Capillary 131 (H) 70 - 99 mg/dL  Glucose, capillary  Result Value Ref Range   Glucose-Capillary 89 70 - 99 mg/dL  CBC with Differential/Platelet  Result Value Ref Range   WBC 4.3 4.0 - 10.5 K/uL   RBC 4.65 3.87 - 5.11 MIL/uL   Hemoglobin 9.2 (L) 12.0 - 15.0 g/dL   HCT 28.7 (L) 36.0 - 46.0 %   MCV 61.7 (L) 80.0 - 100.0 fL   MCH 19.8 (L) 26.0 - 34.0 pg   MCHC 32.1 30.0 - 36.0 g/dL   RDW 16.3 (H) 11.5 - 15.5 %   Platelets 178 150 - 400 K/uL   nRBC 0.0 0.0 - 0.2 %   Neutrophils Relative % 48 %   Neutro Abs 2.1 1.7 - 7.7 K/uL   Lymphocytes Relative 45 %   Lymphs Abs 2.0 0.7 -  4.0 K/uL   Monocytes Relative 6 %   Monocytes Absolute 0.3 0.1 - 1.0 K/uL   Eosinophils Relative 0 %    Eosinophils Absolute 0.0 0.0 - 0.5 K/uL   Basophils Relative 0 %   Basophils Absolute 0.0 0.0 - 0.1 K/uL   Immature Granulocytes 1 %   Abs Immature Granulocytes 0.03 0.00 - 0.07 K/uL  Glucose, capillary  Result Value Ref Range   Glucose-Capillary 74 70 - 99 mg/dL  Glucose, capillary  Result Value Ref Range   Glucose-Capillary 88 70 - 99 mg/dL  Comprehensive metabolic panel  Result Value Ref Range   Sodium 141 135 - 145 mmol/L   Potassium 4.8 3.5 - 5.1 mmol/L   Chloride 110 98 - 111 mmol/L   CO2 20 (L) 22 - 32 mmol/L   Glucose, Bld 178 (H) 70 - 99 mg/dL   BUN 28 (H) 8 - 23 mg/dL   Creatinine, Ser 0.93 0.44 - 1.00 mg/dL   Calcium 7.9 (L) 8.9 - 10.3 mg/dL   Total Protein 6.3 (L) 6.5 - 8.1 g/dL   Albumin 2.7 (L) 3.5 - 5.0 g/dL   AST 186 (H) 15 - 41 U/L   ALT 70 (H) 0 - 44 U/L   Alkaline Phosphatase 382 (H) 38 - 126 U/L   Total Bilirubin 1.4 (H) 0.3 - 1.2 mg/dL   GFR, Estimated 59 (L) >60 mL/min   Anion gap 11 5 - 15  C-reactive protein  Result Value Ref Range   CRP 15.5 (H) <1.0 mg/dL  Ferritin  Result Value Ref Range   Ferritin 1,783 (H) 11 - 307 ng/mL  Glucose, capillary  Result Value Ref Range   Glucose-Capillary 105 (H) 70 - 99 mg/dL  Glucose, capillary  Result Value Ref Range   Glucose-Capillary 191 (H) 70 - 99 mg/dL  D-dimer, quantitative (not at [facility])  Result Value Ref Range   D-Dimer, Quant 0.73 (H) 0.00 - 0.50 ug/mL-FEU  Glucose, capillary  Result Value Ref Range   Glucose-Capillary 159 (H) 70 - 99 mg/dL  Glucose, capillary  Result Value Ref Range   Glucose-Capillary 178 (H) 70 - 99 mg/dL  Comprehensive metabolic panel  Result Value Ref Range   Sodium 140 135 - 145 mmol/L   Potassium 4.4 3.5 - 5.1 mmol/L   Chloride 110 98 - 111 mmol/L   CO2 20 (L) 22 - 32 mmol/L   Glucose, Bld 213 (H) 70 - 99 mg/dL   BUN 37 (H) 8 - 23 mg/dL   Creatinine, Ser 1.13 (H) 0.44 - 1.00 mg/dL   Calcium 8.2 (L) 8.9 - 10.3 mg/dL   Total Protein 6.6 6.5 - 8.1 g/dL   Albumin  2.7 (L) 3.5 - 5.0 g/dL   AST 82 (H) 15 - 41 U/L   ALT 63 (H) 0 - 44 U/L   Alkaline Phosphatase 369 (H) 38 - 126 U/L   Total Bilirubin 1.0 0.3 - 1.2 mg/dL   GFR, Estimated 47 (L) >60 mL/min   Anion gap 10 5 - 15  C-reactive protein  Result Value Ref Range   CRP 9.6 (H) <1.0 mg/dL  Ferritin  Result Value Ref Range   Ferritin 2,026 (H) 11 - 307 ng/mL  Glucose, capillary  Result Value Ref Range   Glucose-Capillary 187 (H) 70 - 99 mg/dL  Glucose, capillary  Result Value Ref Range   Glucose-Capillary 211 (H) 70 - 99 mg/dL  D-dimer, quantitative (not at [facility])  Result Value Ref Range   D-Dimer, Quant  0.57 (H) 0.00 - 0.50 ug/mL-FEU  Glucose, capillary  Result Value Ref Range   Glucose-Capillary 211 (H) 70 - 99 mg/dL  Glucose, capillary  Result Value Ref Range   Glucose-Capillary 251 (H) 70 - 99 mg/dL  Glucose, capillary  Result Value Ref Range   Glucose-Capillary 237 (H) 70 - 99 mg/dL  C-reactive protein  Result Value Ref Range   CRP 5.9 (H) <1.0 mg/dL  Comprehensive metabolic panel  Result Value Ref Range   Sodium 142 135 - 145 mmol/L   Potassium 4.4 3.5 - 5.1 mmol/L   Chloride 110 98 - 111 mmol/L   CO2 23 22 - 32 mmol/L   Glucose, Bld 182 (H) 70 - 99 mg/dL   BUN 35 (H) 8 - 23 mg/dL   Creatinine, Ser 1.03 (H) 0.44 - 1.00 mg/dL   Calcium 8.4 (L) 8.9 - 10.3 mg/dL   Total Protein 6.4 (L) 6.5 - 8.1 g/dL   Albumin 2.8 (L) 3.5 - 5.0 g/dL   AST 82 (H) 15 - 41 U/L   ALT 56 (H) 0 - 44 U/L   Alkaline Phosphatase 330 (H) 38 - 126 U/L   Total Bilirubin 0.7 0.3 - 1.2 mg/dL   GFR, Estimated 52 (L) >60 mL/min   Anion gap 9 5 - 15  Ferritin  Result Value Ref Range   Ferritin 1,371 (H) 11 - 307 ng/mL  Magnesium  Result Value Ref Range   Magnesium 2.2 1.7 - 2.4 mg/dL  Phosphorus  Result Value Ref Range   Phosphorus 2.5 2.5 - 4.6 mg/dL  Lactate dehydrogenase  Result Value Ref Range   LDH 228 (H) 98 - 192 U/L  CBC with Differential/Platelet  Result Value Ref Range   WBC 5.2 4.0  - 10.5 K/uL   RBC 5.31 (H) 3.87 - 5.11 MIL/uL   Hemoglobin 10.4 (L) 12.0 - 15.0 g/dL   HCT 32.1 (L) 36.0 - 46.0 %   MCV 60.5 (L) 80.0 - 100.0 fL   MCH 19.6 (L) 26.0 - 34.0 pg   MCHC 32.4 30.0 - 36.0 g/dL   RDW 16.4 (H) 11.5 - 15.5 %   Platelets 337 150 - 400 K/uL   nRBC 0.0 0.0 - 0.2 %   Neutrophils Relative % 59 %   Neutro Abs 3.1 1.7 - 7.7 K/uL   Lymphocytes Relative 29 %   Lymphs Abs 1.5 0.7 - 4.0 K/uL   Monocytes Relative 10 %   Monocytes Absolute 0.5 0.1 - 1.0 K/uL   Eosinophils Relative 0 %   Eosinophils Absolute 0.0 0.0 - 0.5 K/uL   Basophils Relative 0 %   Basophils Absolute 0.0 0.0 - 0.1 K/uL   WBC Morphology MILD LEFT SHIFT (1-5% METAS, OCC MYELO, OCC BANDS)    Smear Review Normal platelet morphology    Immature Granulocytes 2 %   Abs Immature Granulocytes 0.08 (H) 0.00 - 0.07 K/uL  Vitamin B12  Result Value Ref Range   Vitamin B-12 1,980 (H) 180 - 914 pg/mL  Folate  Result Value Ref Range   Folate 21.4 >5.9 ng/mL  Iron and TIBC  Result Value Ref Range   Iron 111 28 - 170 ug/dL   TIBC 157 (L) 250 - 450 ug/dL   Saturation Ratios 71 (H) 10.4 - 31.8 %   UIBC 46 ug/dL  Reticulocytes  Result Value Ref Range   Retic Ct Pct 0.8 0.4 - 3.1 %   RBC. 5.20 (H) 3.87 - 5.11 MIL/uL   Retic Count, Absolute  43.2 19.0 - 186.0 K/uL   Immature Retic Fract 13.9 2.3 - 15.9 %  Haptoglobin  Result Value Ref Range   Haptoglobin 198 41 - 333 mg/dL  Glucose, capillary  Result Value Ref Range   Glucose-Capillary 253 (H) 70 - 99 mg/dL  Glucose, capillary  Result Value Ref Range   Glucose-Capillary 177 (H) 70 - 99 mg/dL  D-dimer, quantitative (not at [facility])  Result Value Ref Range   D-Dimer, Quant 0.41 0.00 - 0.50 ug/mL-FEU  POC SARS Coronavirus 2 Ag-ED - Nasal Swab (BD Veritor Kit)  Result Value Ref Range   SARS Coronavirus 2 Ag Positive (A) Negative  POC SARS Coronavirus 2 Ag-ED -  Result Value Ref Range   SARS Coronavirus 2 Ag POSITIVE (A) NEGATIVE  Troponin I (High  Sensitivity)  Result Value Ref Range   Troponin I (High Sensitivity) 45 (H) <18 ng/L  Troponin I (High Sensitivity)  Result Value Ref Range   Troponin I (High Sensitivity) 47 (H) <18 ng/L  Troponin I (High Sensitivity)  Result Value Ref Range   Troponin I (High Sensitivity) 57 (H) <18 ng/L  Troponin I (High Sensitivity)  Result Value Ref Range   Troponin I (High Sensitivity) 66 (H) <18 ng/L      Assessment & Plan:   Problem List Items Addressed This Visit    HLD (hyperlipidemia) - Primary    Controlled cholesterol off statin and some limited lifestyle Calculated ASCVD 10 yr risk score elevated in DM and age  Plan: 1. Continue ASA 12m for secondary ASCVD risk reduction 2. Encourage improved lifestyle - low carb/cholesterol, reduce portion size, continue improving       Relevant Medications   atorvastatin (LIPITOR) 20 MG tablet   lisinopril (ZESTRIL) 10 MG tablet   Essential (primary) hypertension    Controlled - Home BP readings, none available still No known complications / History of CVA on ASA    Plan:  1. Continue current BP regimen - Lisinopril 1102mdaily 2. Encourage improved lifestyle - improve hydration, low sodium diet exercise      Relevant Medications   atorvastatin (LIPITOR) 20 MG tablet   lisinopril (ZESTRIL) 10 MG tablet   gabapentin (NEURONTIN) 100 MG capsule   Diabetic peripheral neuropathy (HCC)    Continue Gabapentin as prescribed      Relevant Medications   atorvastatin (LIPITOR) 20 MG tablet   lisinopril (ZESTRIL) 10 MG tablet   gabapentin (NEURONTIN) 100 MG capsule   Chronic gout of multiple sites    Chronic problem recurrent flares foot/ankle usually Refill Tramadol PRN for gout      Relevant Medications   traMADol (ULTRAM) 50 MG tablet        Meds ordered this encounter  Medications  . atorvastatin (LIPITOR) 20 MG tablet    Sig: Take 1 tablet (20 mg total) by mouth daily.    Dispense:  90 tablet    Refill:  3  . lisinopril  (ZESTRIL) 10 MG tablet    Sig: Take 1 tablet (10 mg total) by mouth daily.    Dispense:  90 tablet    Refill:  3  . traMADol (ULTRAM) 50 MG tablet    Sig: Take 1-2 tablets (50-100 mg total) by mouth 2 (two) times daily as needed (Gout).    Dispense:  20 tablet    Refill:  1  . gabapentin (NEURONTIN) 100 MG capsule    Sig: Take 1 capsule (100 mg total) by mouth 2 (two) times daily.  Dispense:  180 capsule    Refill:  3     Follow up plan: Return in about 3 months (around 03/18/2021) for 3 month follow-up DM A1c gout, knee pain, HTN.  Nobie Putnam, [facility] 12/19/2020, 10:10 AM

## 2020-12-19 NOTE — Assessment & Plan Note (Signed)
Controlled - Home BP readings, none available still No known complications / History of CVA on ASA    Plan:  1. Continue current BP regimen - Lisinopril 10mg  daily 2. Encourage improved lifestyle - improve hydration, low sodium diet exercise

## 2020-12-19 NOTE — Assessment & Plan Note (Signed)
Chronic problem recurrent flares foot/ankle usually Refill Tramadol PRN for gout

## 2020-12-19 NOTE — Assessment & Plan Note (Signed)
Controlled cholesterol off statin and some limited lifestyle Calculated ASCVD 10 yr risk score elevated in DM and age  Plan: 1. Continue ASA 81mg  for secondary ASCVD risk reduction 2. Encourage improved lifestyle - low carb/cholesterol, reduce portion size, continue improving

## 2020-12-19 NOTE — Assessment & Plan Note (Signed)
Continue Gabapentin as prescribed.

## 2020-12-19 NOTE — Patient Instructions (Addendum)
Thank you for coming to the office today.  Use RICE therapy: - R - Rest / relative rest with activity modification avoid overuse of joint - I - Ice packs (make sure you use a towel or sock / something to protect skin) - C - Compression with flexible Knee Sleeve / ACE wrap to apply pressure and reduce swelling allowing more support - E - Elevation - if significant swelling, lift leg above heart level (toes above your nose) to help reduce swelling, most helpful at night after day of being on your feet  Refilled Tramadol for gout as needed  Refilled Gabapentin to take every day., twice a day for neuropathy  Filled other medications.  Breathing is better  If you want a referral to knee specialist to drain fluid from knee let me know  Please schedule a Follow-up Appointment to: Return in about 3 months (around 03/18/2021) for 3 month follow-up DM A1c gout, knee pain, HTN.  If you have any other questions or concerns, please feel free to call the office or send a message through MyChart. You may also schedule an earlier appointment if necessary.  Additionally, you may be receiving a survey about your experience at our office within a few days to 1 week by e-mail or mail. We value your feedback.  Saralyn Pilar, DO [facility], [place]

## 2020-12-21 DIAGNOSIS — A0839 Other viral enteritis: Secondary | ICD-10-CM | POA: Diagnosis not present

## 2020-12-21 DIAGNOSIS — E1140 Type 2 diabetes mellitus with diabetic neuropathy, unspecified: Secondary | ICD-10-CM | POA: Diagnosis not present

## 2020-12-21 DIAGNOSIS — I10 Essential (primary) hypertension: Secondary | ICD-10-CM | POA: Diagnosis not present

## 2020-12-21 DIAGNOSIS — U071 COVID-19: Secondary | ICD-10-CM | POA: Diagnosis not present

## 2020-12-21 DIAGNOSIS — J1282 Pneumonia due to coronavirus disease 2019: Secondary | ICD-10-CM | POA: Diagnosis not present

## 2020-12-21 DIAGNOSIS — E1165 Type 2 diabetes mellitus with hyperglycemia: Secondary | ICD-10-CM | POA: Diagnosis not present

## 2020-12-29 DIAGNOSIS — A0839 Other viral enteritis: Secondary | ICD-10-CM | POA: Diagnosis not present

## 2020-12-29 DIAGNOSIS — E1140 Type 2 diabetes mellitus with diabetic neuropathy, unspecified: Secondary | ICD-10-CM | POA: Diagnosis not present

## 2020-12-29 DIAGNOSIS — J1282 Pneumonia due to coronavirus disease 2019: Secondary | ICD-10-CM | POA: Diagnosis not present

## 2020-12-29 DIAGNOSIS — I10 Essential (primary) hypertension: Secondary | ICD-10-CM | POA: Diagnosis not present

## 2020-12-29 DIAGNOSIS — U071 COVID-19: Secondary | ICD-10-CM | POA: Diagnosis not present

## 2020-12-29 DIAGNOSIS — E1165 Type 2 diabetes mellitus with hyperglycemia: Secondary | ICD-10-CM | POA: Diagnosis not present

## 2021-02-11 ENCOUNTER — Other Ambulatory Visit: Payer: Self-pay | Admitting: Family Medicine

## 2021-07-09 ENCOUNTER — Encounter: Payer: Self-pay | Admitting: Family Medicine

## 2021-07-09 ENCOUNTER — Ambulatory Visit (INDEPENDENT_AMBULATORY_CARE_PROVIDER_SITE_OTHER): Payer: Medicare Other | Admitting: Family Medicine

## 2021-07-09 ENCOUNTER — Other Ambulatory Visit: Payer: Self-pay

## 2021-07-09 VITALS — BP 147/34 | HR 63 | Ht <= 58 in | Wt 123.0 lb

## 2021-07-09 DIAGNOSIS — H9193 Unspecified hearing loss, bilateral: Secondary | ICD-10-CM | POA: Diagnosis not present

## 2021-07-09 DIAGNOSIS — I700 Atherosclerosis of aorta: Secondary | ICD-10-CM

## 2021-07-09 DIAGNOSIS — H1013 Acute atopic conjunctivitis, bilateral: Secondary | ICD-10-CM

## 2021-07-09 DIAGNOSIS — E1140 Type 2 diabetes mellitus with diabetic neuropathy, unspecified: Secondary | ICD-10-CM | POA: Diagnosis not present

## 2021-07-09 MED ORDER — METFORMIN HCL ER 500 MG PO TB24: 500.0000 mg | ORAL_TABLET | Freq: Every day | ORAL | 3 refills | Status: DC

## 2021-07-09 MED ORDER — OLOPATADINE HCL 0.2 % OP SOLN: 1.0000 [drp] | Freq: Two times a day (BID) | OPHTHALMIC | 3 refills | Status: DC | PRN

## 2021-07-09 NOTE — Progress Notes (Signed)
Subjective:    Patient ID: Gabriela Robles, female    DOB: [birth year], 87 y.o.   MRN: [id number]  Gabriela Robles is a 87 y.o. female presenting on 07/09/2021 for Hearing Problem and Knee Pain   HPI  Hearing Loss, Bilateral Needs hearing test. They tried OTC hearing aid did not work.  Itchy Watering Eyes Allergies, reports episodic flares drainage clear and itchy eyes.  CHRONIC DM, Type 2 - Diet Controlled / DM Neuropathy Doing well CBGs: No longer checking CBG. Meds: metformin XR 500 BID - miss PM dose sometimes. Currently on ACEi Diet is balanced, some sweets still - appetite is good overall. Denies hypoglycemia  Atherosclerosis Aorta Seen on CT image 10/2020. On Statin therapy No concerns or complaints  Depression screen [facility] 2/9 11/07/2020 05/02/2020 01/12/2020  Decreased Interest 0 0 0  Down, Depressed, Hopeless 0 0 0  PHQ - 2 Score 0 0 0    Social History   Tobacco Use   Smoking status: Never   Smokeless tobacco: Never  Vaping Use   Vaping Use: Never used  Substance Use Topics   Alcohol use: No    Alcohol/week: 0.0 standard drinks   Drug use: No    Review of Systems Per HPI unless specifically indicated above     Objective:    BP (!) 147/34   Pulse 63   Ht '4\' 10"'  (1.473 m)   Wt 123 lb (55.8 kg)   SpO2 100%   BMI 25.71 kg/m   Wt Readings from Last 3 Encounters:  07/09/21 123 lb (55.8 kg)  12/19/20 116 lb (52.6 kg)  11/21/20 105 lb (47.6 kg)    Physical Exam Vitals and nursing note reviewed.  Constitutional:      General: She is not in acute distress.    Appearance: She is well-developed. She is not diaphoretic.     Comments: Well-appearing elderly female comfortable, cooperative  HENT:     Head: Normocephalic and atraumatic.     Comments: hard of hearing Eyes:     General:        Right eye: No discharge.        Left eye: No discharge.     Conjunctiva/sclera: Conjunctivae normal.  Neck:     Thyroid: No thyromegaly.  Cardiovascular:      Rate and Rhythm: Normal rate and regular rhythm.     Heart sounds: Normal heart sounds. No murmur heard. Pulmonary:     Effort: Pulmonary effort is normal. No respiratory distress.     Breath sounds: Normal breath sounds. No wheezing or rales.  Musculoskeletal:        General: Normal range of motion.     Cervical back: Normal range of motion and neck supple.     Right lower leg: No edema.     Left lower leg: No edema.  Lymphadenopathy:     Cervical: No cervical adenopathy.  Skin:    General: Skin is warm and dry.     Findings: No erythema or rash.  Neurological:     Mental Status: She is alert and oriented to person, place, and time.  Psychiatric:        Behavior: Behavior normal.     Comments: Well groomed, good eye contact, normal speech and thoughts per daughter, interpreting   Results for orders placed or performed during the hospital encounter of 10/29/20  Blood Culture (routine x 2)   Specimen: BLOOD  Result Value Ref Range   Specimen Description BLOOD BLOOD LEFT  FOREARM    Special Requests      BOTTLES DRAWN AEROBIC AND ANAEROBIC Blood Culture results may not be optimal due to an inadequate volume of blood received in culture bottles   Culture      NO GROWTH 5 DAYS Performed at [facility], [address] [place], [place] [address]    Report Status 11/03/2020 FINAL   Blood Culture (routine x 2)   Specimen: BLOOD  Result Value Ref Range   Specimen Description BLOOD BLOOD LEFT FOREARM    Special Requests      BOTTLES DRAWN AEROBIC AND ANAEROBIC Blood Culture adequate volume   Culture      NO GROWTH 5 DAYS Performed at [facility], [address] [place], [place] [address]    Report Status 11/03/2020 FINAL   Basic metabolic panel  Result Value Ref Range   Sodium 130 (L) 135 - 145 mmol/L   Potassium 3.4 (L) 3.5 - 5.1 mmol/L   Chloride 97 (L) 98 - 111 mmol/L   CO2 21 (L) 22 - 32 mmol/L   Glucose, Bld 131 (H) 70 - 99 mg/dL   BUN 21 8 -  23 mg/dL   Creatinine, Ser 1.01 (H) 0.44 - 1.00 mg/dL   Calcium 8.4 (L) 8.9 - 10.3 mg/dL   GFR, Estimated 53 (L) >60 mL/min   Anion gap 12 5 - 15  CBC  Result Value Ref Range   WBC 5.4 4.0 - 10.5 K/uL   RBC 5.22 (H) 3.87 - 5.11 MIL/uL   Hemoglobin 10.1 (L) 12.0 - 15.0 g/dL   HCT 32.3 (L) 36.0 - 46.0 %   MCV 61.9 (L) 80.0 - 100.0 fL   MCH 19.3 (L) 26.0 - 34.0 pg   MCHC 31.3 30.0 - 36.0 g/dL   RDW 15.9 (H) 11.5 - 15.5 %   Platelets 177 150 - 400 K/uL   nRBC 0.0 0.0 - 0.2 %  Urinalysis, Complete w Microscopic  Result Value Ref Range   Color, Urine YELLOW (A) YELLOW   APPearance CLOUDY (A) CLEAR   Specific Gravity, Urine 1.028 1.005 - 1.030   pH 5.0 5.0 - 8.0   Glucose, UA NEGATIVE NEGATIVE mg/dL   Hgb urine dipstick LARGE (A) NEGATIVE   Bilirubin Urine NEGATIVE NEGATIVE   Ketones, ur 5 (A) NEGATIVE mg/dL   Protein, ur NEGATIVE NEGATIVE mg/dL   Nitrite POSITIVE (A) NEGATIVE   Leukocytes,Ua LARGE (A) NEGATIVE   RBC / HPF 21-50 0 - 5 RBC/hpf   WBC, UA >50 (H) 0 - 5 WBC/hpf   Bacteria, UA MANY (A) NONE SEEN   Squamous Epithelial / LPF 0-5 0 - 5   Hyaline Casts, UA PRESENT   Hepatic function panel  Result Value Ref Range   Total Protein 7.0 6.5 - 8.1 g/dL   Albumin 3.2 (L) 3.5 - 5.0 g/dL   AST 26 15 - 41 U/L   ALT 14 0 - 44 U/L   Alkaline Phosphatase 53 38 - 126 U/L   Total Bilirubin 0.6 0.3 - 1.2 mg/dL   Bilirubin, Direct <0.1 0.0 - 0.2 mg/dL   Indirect Bilirubin NOT CALCULATED 0.3 - 0.9 mg/dL  Lactic acid, plasma  Result Value Ref Range   Lactic Acid, Venous 1.7 0.5 - 1.9 mmol/L  Lactic acid, plasma  Result Value Ref Range   Lactic Acid, Venous 1.6 0.5 - 1.9 mmol/L  Procalcitonin  Result Value Ref Range   Procalcitonin <0.10 ng/mL  Triglycerides  Result Value Ref  Range   Triglycerides 127 <150 mg/dL  Fibrinogen  Result Value Ref Range   Fibrinogen 527 (H) 210 - 475 mg/dL  Hemoglobin A1c  Result Value Ref Range   Hgb A1c MFr Bld 7.0 (H) 4.8 - 5.6 %   Mean  Plasma Glucose 154.2 mg/dL  Lipid panel  Result Value Ref Range   Cholesterol 149 0 - 200 mg/dL   Triglycerides 81 <150 mg/dL   HDL 36 (L) >40 mg/dL   Total CHOL/HDL Ratio 4.1 RATIO   VLDL 16 0 - 40 mg/dL   LDL Cholesterol 97 0 - 99 mg/dL  Ferritin  Result Value Ref Range   Ferritin 849 (H) 11 - 307 ng/mL  Lactate dehydrogenase  Result Value Ref Range   LDH 227 (H) 98 - 192 U/L  Magnesium  Result Value Ref Range   Magnesium 1.9 1.7 - 2.4 mg/dL  C-reactive protein  Result Value Ref Range   CRP 17.5 (H) <1.0 mg/dL  Brain natriuretic peptide  Result Value Ref Range   B Natriuretic Peptide 80.9 0.0 - 100.0 pg/mL  Fibrin derivatives D-Dimer ([facility] only)  Result Value Ref Range   Fibrin derivatives D-dimer ([facility]) 735.69 (H) 0.00 - 499.00 ng/mL (FEU)  Hepatitis B surface antigen  Result Value Ref Range   Hepatitis B Surface Ag NON REACTIVE NON REACTIVE  Comprehensive metabolic panel  Result Value Ref Range   Sodium 137 135 - 145 mmol/L   Potassium 4.8 3.5 - 5.1 mmol/L   Chloride 110 98 - 111 mmol/L   CO2 19 (L) 22 - 32 mmol/L   Glucose, Bld 104 (H) 70 - 99 mg/dL   BUN 23 8 - 23 mg/dL   Creatinine, Ser 0.84 0.44 - 1.00 mg/dL   Calcium 7.8 (L) 8.9 - 10.3 mg/dL   Total Protein 6.2 (L) 6.5 - 8.1 g/dL   Albumin 2.7 (L) 3.5 - 5.0 g/dL   AST 119 (H) 15 - 41 U/L   ALT 32 0 - 44 U/L   Alkaline Phosphatase 177 (H) 38 - 126 U/L   Total Bilirubin 0.8 0.3 - 1.2 mg/dL   GFR, Estimated >60 >60 mL/min   Anion gap 8 5 - 15  C-reactive protein  Result Value Ref Range   CRP 17.4 (H) <1.0 mg/dL  Fibrin derivatives D-Dimer ([facility] only)  Result Value Ref Range   Fibrin derivatives D-dimer ([facility]) 709.76 (H) 0.00 - 499.00 ng/mL (FEU)  Ferritin  Result Value Ref Range   Ferritin 1,409 (H) 11 - 307 ng/mL  Magnesium  Result Value Ref Range   Magnesium 2.0 1.7 - 2.4 mg/dL  Glucose, capillary  Result Value Ref Range   Glucose-Capillary 131 (H) 70 - 99 mg/dL  Glucose, capillary  Result Value  Ref Range   Glucose-Capillary 89 70 - 99 mg/dL  CBC with Differential/Platelet  Result Value Ref Range   WBC 4.3 4.0 - 10.5 K/uL   RBC 4.65 3.87 - 5.11 MIL/uL   Hemoglobin 9.2 (L) 12.0 - 15.0 g/dL   HCT 28.7 (L) 36.0 - 46.0 %   MCV 61.7 (L) 80.0 - 100.0 fL   MCH 19.8 (L) 26.0 - 34.0 pg   MCHC 32.1 30.0 - 36.0 g/dL   RDW 16.3 (H) 11.5 - 15.5 %   Platelets 178 150 - 400 K/uL   nRBC 0.0 0.0 - 0.2 %   Neutrophils Relative % 48 %   Neutro Abs 2.1 1.7 - 7.7 K/uL   Lymphocytes Relative 45 %  Lymphs Abs 2.0 0.7 - 4.0 K/uL   Monocytes Relative 6 %   Monocytes Absolute 0.3 0.1 - 1.0 K/uL   Eosinophils Relative 0 %   Eosinophils Absolute 0.0 0.0 - 0.5 K/uL   Basophils Relative 0 %   Basophils Absolute 0.0 0.0 - 0.1 K/uL   Immature Granulocytes 1 %   Abs Immature Granulocytes 0.03 0.00 - 0.07 K/uL  Glucose, capillary  Result Value Ref Range   Glucose-Capillary 74 70 - 99 mg/dL  Glucose, capillary  Result Value Ref Range   Glucose-Capillary 88 70 - 99 mg/dL  Comprehensive metabolic panel  Result Value Ref Range   Sodium 141 135 - 145 mmol/L   Potassium 4.8 3.5 - 5.1 mmol/L   Chloride 110 98 - 111 mmol/L   CO2 20 (L) 22 - 32 mmol/L   Glucose, Bld 178 (H) 70 - 99 mg/dL   BUN 28 (H) 8 - 23 mg/dL   Creatinine, Ser 0.93 0.44 - 1.00 mg/dL   Calcium 7.9 (L) 8.9 - 10.3 mg/dL   Total Protein 6.3 (L) 6.5 - 8.1 g/dL   Albumin 2.7 (L) 3.5 - 5.0 g/dL   AST 186 (H) 15 - 41 U/L   ALT 70 (H) 0 - 44 U/L   Alkaline Phosphatase 382 (H) 38 - 126 U/L   Total Bilirubin 1.4 (H) 0.3 - 1.2 mg/dL   GFR, Estimated 59 (L) >60 mL/min   Anion gap 11 5 - 15  C-reactive protein  Result Value Ref Range   CRP 15.5 (H) <1.0 mg/dL  Ferritin  Result Value Ref Range   Ferritin 1,783 (H) 11 - 307 ng/mL  Glucose, capillary  Result Value Ref Range   Glucose-Capillary 105 (H) 70 - 99 mg/dL  Glucose, capillary  Result Value Ref Range   Glucose-Capillary 191 (H) 70 - 99 mg/dL  D-dimer, quantitative (not at  [facility])  Result Value Ref Range   D-Dimer, Quant 0.73 (H) 0.00 - 0.50 ug/mL-FEU  Glucose, capillary  Result Value Ref Range   Glucose-Capillary 159 (H) 70 - 99 mg/dL  Glucose, capillary  Result Value Ref Range   Glucose-Capillary 178 (H) 70 - 99 mg/dL  Comprehensive metabolic panel  Result Value Ref Range   Sodium 140 135 - 145 mmol/L   Potassium 4.4 3.5 - 5.1 mmol/L   Chloride 110 98 - 111 mmol/L   CO2 20 (L) 22 - 32 mmol/L   Glucose, Bld 213 (H) 70 - 99 mg/dL   BUN 37 (H) 8 - 23 mg/dL   Creatinine, Ser 1.13 (H) 0.44 - 1.00 mg/dL   Calcium 8.2 (L) 8.9 - 10.3 mg/dL   Total Protein 6.6 6.5 - 8.1 g/dL   Albumin 2.7 (L) 3.5 - 5.0 g/dL   AST 82 (H) 15 - 41 U/L   ALT 63 (H) 0 - 44 U/L   Alkaline Phosphatase 369 (H) 38 - 126 U/L   Total Bilirubin 1.0 0.3 - 1.2 mg/dL   GFR, Estimated 47 (L) >60 mL/min   Anion gap 10 5 - 15  C-reactive protein  Result Value Ref Range   CRP 9.6 (H) <1.0 mg/dL  Ferritin  Result Value Ref Range   Ferritin 2,026 (H) 11 - 307 ng/mL  Glucose, capillary  Result Value Ref Range   Glucose-Capillary 187 (H) 70 - 99 mg/dL  Glucose, capillary  Result Value Ref Range   Glucose-Capillary 211 (H) 70 - 99 mg/dL  D-dimer, quantitative (not at [facility])  Result Value Ref Range  D-Dimer, Quant 0.57 (H) 0.00 - 0.50 ug/mL-FEU  Glucose, capillary  Result Value Ref Range   Glucose-Capillary 211 (H) 70 - 99 mg/dL  Glucose, capillary  Result Value Ref Range   Glucose-Capillary 251 (H) 70 - 99 mg/dL  Glucose, capillary  Result Value Ref Range   Glucose-Capillary 237 (H) 70 - 99 mg/dL  C-reactive protein  Result Value Ref Range   CRP 5.9 (H) <1.0 mg/dL  Comprehensive metabolic panel  Result Value Ref Range   Sodium 142 135 - 145 mmol/L   Potassium 4.4 3.5 - 5.1 mmol/L   Chloride 110 98 - 111 mmol/L   CO2 23 22 - 32 mmol/L   Glucose, Bld 182 (H) 70 - 99 mg/dL   BUN 35 (H) 8 - 23 mg/dL   Creatinine, Ser 1.03 (H) 0.44 - 1.00 mg/dL   Calcium 8.4 (L) 8.9 - 10.3  mg/dL   Total Protein 6.4 (L) 6.5 - 8.1 g/dL   Albumin 2.8 (L) 3.5 - 5.0 g/dL   AST 82 (H) 15 - 41 U/L   ALT 56 (H) 0 - 44 U/L   Alkaline Phosphatase 330 (H) 38 - 126 U/L   Total Bilirubin 0.7 0.3 - 1.2 mg/dL   GFR, Estimated 52 (L) >60 mL/min   Anion gap 9 5 - 15  Ferritin  Result Value Ref Range   Ferritin 1,371 (H) 11 - 307 ng/mL  Magnesium  Result Value Ref Range   Magnesium 2.2 1.7 - 2.4 mg/dL  Phosphorus  Result Value Ref Range   Phosphorus 2.5 2.5 - 4.6 mg/dL  Lactate dehydrogenase  Result Value Ref Range   LDH 228 (H) 98 - 192 U/L  CBC with Differential/Platelet  Result Value Ref Range   WBC 5.2 4.0 - 10.5 K/uL   RBC 5.31 (H) 3.87 - 5.11 MIL/uL   Hemoglobin 10.4 (L) 12.0 - 15.0 g/dL   HCT 32.1 (L) 36.0 - 46.0 %   MCV 60.5 (L) 80.0 - 100.0 fL   MCH 19.6 (L) 26.0 - 34.0 pg   MCHC 32.4 30.0 - 36.0 g/dL   RDW 16.4 (H) 11.5 - 15.5 %   Platelets 337 150 - 400 K/uL   nRBC 0.0 0.0 - 0.2 %   Neutrophils Relative % 59 %   Neutro Abs 3.1 1.7 - 7.7 K/uL   Lymphocytes Relative 29 %   Lymphs Abs 1.5 0.7 - 4.0 K/uL   Monocytes Relative 10 %   Monocytes Absolute 0.5 0.1 - 1.0 K/uL   Eosinophils Relative 0 %   Eosinophils Absolute 0.0 0.0 - 0.5 K/uL   Basophils Relative 0 %   Basophils Absolute 0.0 0.0 - 0.1 K/uL   WBC Morphology MILD LEFT SHIFT (1-5% METAS, OCC MYELO, OCC BANDS)    Smear Review Normal platelet morphology    Immature Granulocytes 2 %   Abs Immature Granulocytes 0.08 (H) 0.00 - 0.07 K/uL  Vitamin B12  Result Value Ref Range   Vitamin B-12 1,980 (H) 180 - 914 pg/mL  Folate  Result Value Ref Range   Folate 21.4 >5.9 ng/mL  Iron and TIBC  Result Value Ref Range   Iron 111 28 - 170 ug/dL   TIBC 157 (L) 250 - 450 ug/dL   Saturation Ratios 71 (H) 10.4 - 31.8 %   UIBC 46 ug/dL  Reticulocytes  Result Value Ref Range   Retic Ct Pct 0.8 0.4 - 3.1 %   RBC. 5.20 (H) 3.87 - 5.11 MIL/uL   Retic  Count, Absolute 43.2 19.0 - 186.0 K/uL   Immature Retic Fract 13.9  2.3 - 15.9 %  Haptoglobin  Result Value Ref Range   Haptoglobin 198 41 - 333 mg/dL  Glucose, capillary  Result Value Ref Range   Glucose-Capillary 253 (H) 70 - 99 mg/dL  Glucose, capillary  Result Value Ref Range   Glucose-Capillary 177 (H) 70 - 99 mg/dL  D-dimer, quantitative (not at [facility])  Result Value Ref Range   D-Dimer, Quant 0.41 0.00 - 0.50 ug/mL-FEU  POC SARS Coronavirus 2 Ag-ED - Nasal Swab (BD Veritor Kit)  Result Value Ref Range   SARS Coronavirus 2 Ag Positive (A) Negative  POC SARS Coronavirus 2 Ag-ED -  Result Value Ref Range   SARS Coronavirus 2 Ag POSITIVE (A) NEGATIVE  Troponin I (High Sensitivity)  Result Value Ref Range   Troponin I (High Sensitivity) 45 (H) <18 ng/L  Troponin I (High Sensitivity)  Result Value Ref Range   Troponin I (High Sensitivity) 47 (H) <18 ng/L  Troponin I (High Sensitivity)  Result Value Ref Range   Troponin I (High Sensitivity) 57 (H) <18 ng/L  Troponin I (High Sensitivity)  Result Value Ref Range   Troponin I (High Sensitivity) 66 (H) <18 ng/L   I have personally reviewed the radiology report from 10/29/20 CT  Narrative & Impression  CLINICAL DATA:  Weakness.   EXAM: CT ABDOMEN AND PELVIS WITH CONTRAST   TECHNIQUE: Multidetector CT imaging of the abdomen and pelvis was performed using the standard protocol following bolus administration of intravenous contrast.   CONTRAST:  79m OMNIPAQUE IOHEXOL 300 MG/ML  SOLN   COMPARISON:  None.   FINDINGS: Lower chest: Bilateral patchy airspace opacities are noted concerning for multifocal pneumonia.   Hepatobiliary: No focal liver abnormality is seen. Status post cholecystectomy. No biliary dilatation.   Pancreas: Unremarkable. No pancreatic ductal dilatation or surrounding inflammatory changes.   Spleen: Normal in size without focal abnormality.   Adrenals/Urinary Tract: Adrenal glands are unremarkable. Kidneys are normal, without renal calculi, focal lesion, or  hydronephrosis. Bladder is unremarkable.   Stomach/Bowel: Stomach is within normal limits. Appendix appears normal. No evidence of bowel wall thickening, distention, or inflammatory changes.   Vascular/Lymphatic: Aortic atherosclerosis. No enlarged abdominal or pelvic lymph nodes.   Reproductive: Uterus and bilateral adnexa are unremarkable.   Other: No abdominal wall hernia or abnormality. No abdominopelvic ascites.   Musculoskeletal: No acute or significant osseous findings.   IMPRESSION: 1. Bilateral patchy airspace opacities are noted concerning for multifocal pneumonia. 2. Aortic atherosclerosis. 3. No acute abnormality seen in the abdomen or pelvis.   Aortic Atherosclerosis (ICD10-I70.0).     Electronically Signed   By: JMarijo ConceptionM.D.   On: 10/29/2020 16:01       Assessment & Plan:   Problem List Items Addressed This Visit     Atherosclerosis of aorta ([place]   Other Visit Diagnoses     Controlled type 2 diabetes mellitus with diabetic neuropathy, without long-term current use of insulin ([place]    -  Primary   Relevant Medications   metFORMIN (GLUCOPHAGE XR) 500 MG 24 hr tablet   Allergic conjunctivitis of both eyes       Relevant Medications   Olopatadine HCl 0.2 % SOLN   Bilateral hearing loss, unspecified hearing loss type       Relevant Orders   Ambulatory referral to Audiology      Allergic conjunctivitis Order Olopatadine eye drops PRN use  Diabetes, controlled  Last A1c 7 range Will reduce dose to Metformin XR 572m daily in AM can skip PM dosing now, new rx sent. Since was forgetting PM dose. Tolerating well.  Atherosclerosis of Aorta Identified on CT image 10/2020 On statin   Referral to Audiology [facility] for further hearing assessment   Orders Placed This Encounter  Procedures   Ambulatory referral to Audiology    Referral Priority:   Routine    Referral Type:   Audiology Exam    Referral Reason:   Specialty Services  Required    Number of Visits Requested:   1     Meds ordered this encounter  Medications   metFORMIN (GLUCOPHAGE XR) 500 MG 24 hr tablet    Sig: Take 1 tablet (500 mg total) by mouth daily with breakfast.    Dispense:  90 tablet    Refill:  3    Reduced dose from twice a day down to once daily in AM   Olopatadine HCl 0.2 % SOLN    Sig: Apply 1 drop to eye 2 (two) times daily as needed (allergic eye). Apply to both eyes    Dispense:  2.5 mL    Refill:  3      Follow up plan: Return in about 5 months (around 12/09/2021) for 5 month Yearly Medicare Checkup, AM apt, fasting lab AFTER.   ANobie Putnam [facility] 07/09/2021, 4:26 PM

## 2021-07-09 NOTE — Patient Instructions (Addendum)
Thank you for coming to the office today.  They will call to schedule for hearing evaluation and determine if need hearing aids.  [facility] - Audiology department [facility] [address]  [place], [place] [address] Ph: [phone]  Switch Metformin to ONE pill only in the morning with meal. 500mg  XR  Start eye drop for allergies as needed for watery itchy eyes, use 1 drop in each eye TWICE a day AS NEEDED, can skip if not needed.  Please schedule a Follow-up Appointment to: Return in about 5 months (around 12/09/2021) for 5 month Yearly Medicare Checkup, AM apt, fasting lab AFTER.  If you have any other questions or concerns, please feel free to call the office or send a message through MyChart. You may also schedule an earlier appointment if necessary.  Additionally, you may be receiving a survey about your experience at our office within a few days to 1 week by e-mail or mail. We value your feedback.  02/06/2022, DO [facility], [facility]

## 2021-08-06 DIAGNOSIS — H6123 Impacted cerumen, bilateral: Secondary | ICD-10-CM | POA: Diagnosis not present

## 2021-08-06 DIAGNOSIS — H903 Sensorineural hearing loss, bilateral: Secondary | ICD-10-CM | POA: Diagnosis not present

## 2021-11-13 ENCOUNTER — Ambulatory Visit: Payer: Medicare Other

## 2021-12-12 ENCOUNTER — Encounter: Payer: Medicare Other | Admitting: Family Medicine

## 2021-12-13 ENCOUNTER — Other Ambulatory Visit: Payer: Self-pay | Admitting: Family Medicine

## 2021-12-13 DIAGNOSIS — E782 Mixed hyperlipidemia: Secondary | ICD-10-CM

## 2021-12-13 NOTE — Telephone Encounter (Signed)
Requested medications are due for refill today.  yes  Requested medications are on the active medications list.  yes  Last refill. 12/19/2020 #90 3  refills  Future visit scheduled.   yes  Notes to clinic.  Refill failed protocol d/t expired labs.    Requested Prescriptions  Pending Prescriptions Disp Refills   atorvastatin (LIPITOR) 20 MG tablet [Pharmacy Med Name: ATORVASTATIN 20 MG TABLET] 90 tablet 3    Sig: TAKE 1 TABLET BY MOUTH EVERY DAY     Cardiovascular:  Antilipid - Statins Failed - 12/13/2021  1:47 AM      Failed - Lipid Panel in normal range within the last 12 months    Cholesterol, Total  Date Value Ref Range Status  04/17/2015 209 (H) 100 - 199 mg/dL Final   Cholesterol  Date Value Ref Range Status  10/30/2020 149 0 - 200 mg/dL Final   LDL Cholesterol (Calc)  Date Value Ref Range Status  09/28/2019 105 (H) mg/dL (calc) Final    Comment:    Reference range: <100 . Desirable range <100 mg/dL for primary prevention;   <70 mg/dL for patients with CHD or diabetic patients  with > or = 2 CHD risk factors. Marland Kitchen LDL-C is now calculated using the Martin-[place]  calculation, which is a validated novel method providing  better accuracy than the Friedewald equation in the  estimation of LDL-C.  Cresenciano Genre et al. Annamaria Helling. [id number]): 2061-2068  (http://education.[facility].com/faq/FAQ164)    LDL Cholesterol  Date Value Ref Range Status  10/30/2020 97 0 - 99 mg/dL Final    Comment:           Total Cholesterol/HDL:CHD Risk Coronary Heart Disease Risk Table                     Men   Women  1/2 Average Risk   3.4   3.3  Average Risk       5.0   4.4  2 X Average Risk   9.6   7.1  3 X Average Risk  23.4   11.0        Use the calculated Patient Ratio above and the CHD Risk Table to determine the patient's CHD Risk.        ATP III CLASSIFICATION (LDL):  <100     mg/dL   Optimal  100-129  mg/dL   Near or Above                    Optimal  130-159  mg/dL    Borderline  160-189  mg/dL   High  >190     mg/dL   Very High Performed at [facility], [place], [place] [address]    HDL  Date Value Ref Range Status  10/30/2020 36 (L) >40 mg/dL Final  04/17/2015 55 >39 mg/dL Final    Comment:    According to ATP-III Guidelines, HDL-C >59 mg/dL is considered a negative risk factor for CHD.    Triglycerides  Date Value Ref Range Status  10/30/2020 81 <150 mg/dL Final         Passed - Patient is not pregnant      Passed - Valid encounter within last 12 months    Recent Outpatient Visits           5 months ago Controlled type 2 diabetes mellitus with diabetic neuropathy, without long-term current use of insulin ([place])   [facility], Sheppard Coil  J, DO   11 months ago Mixed hyperlipidemia   [place], DO   1 year ago Pneumonia due to COVID-19 virus   [place], DO   1 year ago Diabetic peripheral neuropathy [facility])   [place], DO   1 year ago Acute gout of right ankle, unspecified cause   [place], DO       Future Appointments             In 2 weeks [place], Devonne Doughty, DO [facility], [facility]

## 2021-12-14 ENCOUNTER — Encounter: Payer: Medicare Other | Admitting: Family Medicine

## 2021-12-26 ENCOUNTER — Other Ambulatory Visit: Payer: Self-pay | Admitting: Family Medicine

## 2021-12-26 DIAGNOSIS — I10 Essential (primary) hypertension: Secondary | ICD-10-CM

## 2021-12-26 NOTE — Telephone Encounter (Signed)
Requested medications are due for refill today.  yes ? ?Requested medications are on the active medications list.  yes ? ?Last refill. 12/19/2020 #90 3 refills ? ?Future visit scheduled.   yes ? ?Notes to clinic.  Labs are expired. ? ? ? ?Requested Prescriptions  ?Pending Prescriptions Disp Refills  ? lisinopril (ZESTRIL) 10 MG tablet [Pharmacy Med Name: LISINOPRIL 10 MG TABLET] 90 tablet 3  ?  Sig: TAKE 1 TABLET BY MOUTH EVERY DAY  ?  ? Cardiovascular:  ACE Inhibitors Failed - 12/26/2021  1:44 AM  ?  ?  Failed - Cr in normal range and within 180 days  ?  Creat  ?Date Value Ref Range Status  ?09/28/2019 0.92 (H) 0.60 - 0.88 mg/dL Final  ?  Comment:  ?  For patients >61 years of age, the reference limit ?for Creatinine is approximately 13% higher for people ?identified as African-American. ?. ?  ? ?Creatinine, Ser  ?Date Value Ref Range Status  ?11/02/2020 1.03 (H) 0.44 - 1.00 mg/dL Final  ?  ?  ?  ?  Failed - K in normal range and within 180 days  ?  Potassium  ?Date Value Ref Range Status  ?11/02/2020 4.4 3.5 - 5.1 mmol/L Final  ?  ?  ?  ?  Failed - Last BP in normal range  ?  BP Readings from Last 1 Encounters:  ?07/09/21 (!) 147/34  ?  ?  ?  ?  Passed - Patient is not pregnant  ?  ?  Passed - Valid encounter within last 6 months  ?  Recent Outpatient Visits   ? ?      ? 5 months ago Controlled type 2 diabetes mellitus with diabetic neuropathy, without long-term current use of insulin (HCC)  ? [facility] [place], Netta Neat, DO  ? 1 year ago Mixed hyperlipidemia  ? [facility] [place], Netta Neat, DO  ? 1 year ago Pneumonia due to COVID-19 virus  ? [facility] [place], Netta Neat, DO  ? 1 year ago Diabetic peripheral neuropathy [facility])  ? [facility] [place], Netta Neat, DO  ? 1 year ago Acute gout of right ankle, unspecified cause  ? [facility] Smitty Cords, DO  ? ?  ?  ?Future Appointments   ? ?        ?  Tomorrow Althea Charon, Netta Neat, DO [facility]  ? ?  ? ?  ?  ?  ?  ?

## 2021-12-27 ENCOUNTER — Ambulatory Visit (INDEPENDENT_AMBULATORY_CARE_PROVIDER_SITE_OTHER): Payer: Medicare Other | Admitting: Family Medicine

## 2021-12-27 ENCOUNTER — Encounter: Payer: Self-pay | Admitting: Family Medicine

## 2021-12-27 ENCOUNTER — Other Ambulatory Visit: Payer: Self-pay

## 2021-12-27 ENCOUNTER — Ambulatory Visit
Admission: RE | Admit: 2021-12-27 | Discharge: 2021-12-27 | Disposition: A | Discharge status: Home / Self Care | Payer: Medicare Other | Attending: Family Medicine | Admitting: Family Medicine

## 2021-12-27 ENCOUNTER — Ambulatory Visit
Admission: RE | Admit: 2021-12-27 | Discharge: 2021-12-27 | Disposition: A | Discharge status: Home / Self Care | Payer: Medicare Other | Source: Ambulatory Visit | Attending: Family Medicine | Admitting: Family Medicine

## 2021-12-27 VITALS — BP 148/62 | HR 67 | Ht <= 58 in | Wt 125.6 lb

## 2021-12-27 DIAGNOSIS — D509 Iron deficiency anemia, unspecified: Secondary | ICD-10-CM

## 2021-12-27 DIAGNOSIS — E1142 Type 2 diabetes mellitus with diabetic polyneuropathy: Secondary | ICD-10-CM | POA: Diagnosis not present

## 2021-12-27 DIAGNOSIS — I10 Essential (primary) hypertension: Secondary | ICD-10-CM

## 2021-12-27 DIAGNOSIS — M25561 Pain in right knee: Secondary | ICD-10-CM | POA: Diagnosis not present

## 2021-12-27 DIAGNOSIS — M1A09X0 Idiopathic chronic gout, multiple sites, without tophus (tophi): Secondary | ICD-10-CM | POA: Insufficient documentation

## 2021-12-27 DIAGNOSIS — E1140 Type 2 diabetes mellitus with diabetic neuropathy, unspecified: Secondary | ICD-10-CM | POA: Diagnosis not present

## 2021-12-27 DIAGNOSIS — I6930 Unspecified sequelae of cerebral infarction: Secondary | ICD-10-CM | POA: Diagnosis not present

## 2021-12-27 DIAGNOSIS — E782 Mixed hyperlipidemia: Secondary | ICD-10-CM | POA: Diagnosis not present

## 2021-12-27 DIAGNOSIS — G8929 Other chronic pain: Secondary | ICD-10-CM

## 2021-12-27 DIAGNOSIS — G720 Drug-induced myopathy: Secondary | ICD-10-CM

## 2021-12-27 MED ORDER — GABAPENTIN 100 MG PO CAPS: 100.0000 mg | ORAL_CAPSULE | Freq: Two times a day (BID) | ORAL | 3 refills | Status: DC

## 2021-12-27 MED ORDER — TRAMADOL HCL 50 MG PO TABS: 50.0000 mg | ORAL_TABLET | Freq: Two times a day (BID) | ORAL | 2 refills | Status: DC | PRN

## 2021-12-27 NOTE — Patient Instructions (Addendum)
Thank you for coming to the office today. ? ?We will check the blood here today and X-ray Right Knee ? ?If knee shows arthritis we can offer a Steroid injection if it works, but it may be too difficult, may need knee specialist. ? ?Refilled Tramadol for pain - take as needed for pain. ? ?Leg cramps ?- Try spoonful of yellow mustard to relieve leg cramps or try daily to prevent the problem ? ?- OTC natural option is Hyland's Leg Cramps (Dissolving tablet) take as needed for muscle cramps ? ?Increase Bananas / potassium in diet to limit cramp ? ?Stay well hydrated ? ?Please schedule a Follow-up Appointment to: Return in about 2 weeks (around 01/10/2022) for 2 weeks follow-up Right knee injection. ? ?If you have any other questions or concerns, please feel free to call the office or send a message through MyChart. You may also schedule an earlier appointment if necessary. ? ?Additionally, you may be receiving a survey about your experience at our office within a few days to 1 week by e-mail or mail. We value your feedback. ? ?Saralyn Pilar, DO ?[facility], [place] ?

## 2021-12-27 NOTE — Progress Notes (Signed)
Subjective:    Patient ID: Gabriela Robles, female    DOB: [birth year], 88 y.o.   MRN: [id number]  Gabriela Robles is a 88 y.o. female presenting on 12/27/2021 for Annual Exam   HPI  Here for Annual Physical and Lab Review.  Muscle cramping Lower extremity Right Knee Pain chronic bulky joint with some swelling History of arthritis / gout Improved w/ massage Use cane  Hearing Loss, Bilateral Recent audiology, has new hearing aid   CHRONIC DM, Type 2 - Diet Controlled / DM Neuropathy Doing well CBGs: No longer checking CBG. Meds: metformin XR 500 BID - miss PM dose sometimes. Currently on ACEi Diet is balanced, some sweets still - appetite is good overall. Denies hypoglycemia   Atherosclerosis Aorta Seen on CT image 10/2020. On Statin therapy No concerns or complaints   Depression screen [facility] 2/9 11/07/2020 05/02/2020 01/12/2020  Decreased Interest 0 0 0  Down, Depressed, Hopeless 0 0 0  PHQ - 2 Score 0 0 0    Past Medical History:  Diagnosis Date   Acute ischemic left MCA stroke (HCC)    Arthritis    Diabetes mellitus without complication ([place])    History of stroke 2017   Hyperlipidemia    Hypertension    Past Surgical History:  Procedure Laterality Date   LIVER SURGERY     per her son   Social History   Socioeconomic History   Marital status: Widowed    Spouse name: Not on file   Number of children: 10   Years of education: Not on file   Highest education level: Not on file  Occupational History   Occupation: retired  Tobacco Use   Smoking status: Never   Smokeless tobacco: Never  Vaping Use   Vaping Use: Never used  Substance and Sexual Activity   Alcohol use: No    Alcohol/week: 0.0 standard drinks   Drug use: No   Sexual activity: Not Currently  Other Topics Concern   Not on file  Social History Narrative   5 children living and 5 children deceased.    Social Determinants of Health   Financial Resource Strain: Not on file  Food  Insecurity: Not on file  Transportation Needs: Not on file  Physical Activity: Not on file  Stress: Not on file  Social Connections: Not on file  Intimate Partner Violence: Not on file   Family History  Problem Relation Age of Onset   Cancer Daughter        bone cancer   Current Outpatient Medications on File Prior to Visit  Medication Sig   atorvastatin (LIPITOR) 20 MG tablet TAKE 1 TABLET BY MOUTH EVERY DAY   [employer] ASPIRIN ADULT LOW DOSE 81 MG chewable tablet Chew 81 mg by mouth daily.   lisinopril (ZESTRIL) 10 MG tablet TAKE 1 TABLET BY MOUTH EVERY DAY   metFORMIN (GLUCOPHAGE XR) 500 MG 24 hr tablet Take 1 tablet (500 mg total) by mouth daily with breakfast.   Omega-3 Fatty Acids (FISH OIL) 1000 MG CPDR Take by mouth.   VITRON-C 65-125 MG TABS TAKE 1 TABLET BY MOUTH EVERY DAY   MULTIPLE VITAMINS-MINERALS ER PO Take by mouth. (Patient not taking: Reported on 12/19/2020)   Olopatadine HCl 0.2 % SOLN Apply 1 drop to eye 2 (two) times daily as needed (allergic eye). Apply to both eyes (Patient not taking: Reported on 12/27/2021)   pantoprazole (PROTONIX) 40 MG tablet Take 1 tablet (40 mg total) by mouth 2 (two) times  daily.   No current facility-administered medications on file prior to visit.    Review of Systems  Constitutional:  Negative for activity change, appetite change, chills, diaphoresis, fatigue and fever.  HENT:  Negative for congestion and hearing loss.   Eyes:  Negative for visual disturbance.  Respiratory:  Negative for cough, chest tightness, shortness of breath and wheezing.   Cardiovascular:  Negative for chest pain, palpitations and leg swelling.  Gastrointestinal:  Negative for abdominal pain, constipation, diarrhea, nausea and vomiting.  Genitourinary:  Negative for dysuria, frequency and hematuria.  Musculoskeletal:  Positive for arthralgias and joint swelling. Negative for neck pain.  Skin:  Negative for rash.  Neurological:  Negative for dizziness, weakness,  light-headedness, numbness and headaches.  Hematological:  Negative for adenopathy.  Psychiatric/Behavioral:  Negative for behavioral problems, dysphoric mood and sleep disturbance.   Per HPI unless specifically indicated above      Objective:    BP (!) 148/62 (BP Location: Left Arm, Cuff Size: Normal)    Pulse 67    Ht '4\' 10"'  (1.473 m)    Wt 125 lb 9.6 oz (57 kg)    SpO2 100%    BMI 26.25 kg/m   Wt Readings from Last 3 Encounters:  12/27/21 125 lb 9.6 oz (57 kg)  07/09/21 123 lb (55.8 kg)  12/19/20 116 lb (52.6 kg)    Physical Exam Vitals and nursing note reviewed.  Constitutional:      General: She is not in acute distress.    Appearance: She is well-developed. She is not diaphoretic.     Comments: Well-appearing elderly 88 yr female, comfortable, cooperative  HENT:     Head: Normocephalic and atraumatic.  Eyes:     General:        Right eye: No discharge.        Left eye: No discharge.     Conjunctiva/sclera: Conjunctivae normal.     Pupils: Pupils are equal, round, and reactive to light.  Neck:     Thyroid: No thyromegaly.     Vascular: No carotid bruit.  Cardiovascular:     Rate and Rhythm: Normal rate and regular rhythm.     Pulses: Normal pulses.     Heart sounds: Normal heart sounds. No murmur heard. Pulmonary:     Effort: Pulmonary effort is normal. No respiratory distress.     Breath sounds: Normal breath sounds. No wheezing or rales.  Abdominal:     General: Bowel sounds are normal. There is no distension.     Palpations: Abdomen is soft. There is no mass.     Tenderness: There is no abdominal tenderness.  Musculoskeletal:        General: No tenderness.     Cervical back: Normal range of motion and neck supple.     Right lower leg: No edema.     Left lower leg: No edema.     Comments: Upper / Lower Extremities: - Normal muscle tone, strength bilateral upper extremities 5/5, lower extremities 5/5  Right knee bulky appearance, crepitus, some joint swelling   Lymphadenopathy:     Cervical: No cervical adenopathy.  Skin:    General: Skin is warm and dry.     Findings: No erythema or rash.  Neurological:     Mental Status: She is alert and oriented to person, place, and time.     Comments: Distal sensation intact to light touch all extremities  Psychiatric:        Mood and Affect: Mood  normal.        Behavior: Behavior normal.        Thought Content: Thought content normal.     Comments: Well groomed, good eye contact, normal speech and thoughts    Diabetic Foot Exam - Simple   Simple Foot Form Diabetic Foot exam was performed with the following findings: Yes 12/27/2021 10:06 AM  Visual Inspection See comments: Yes Sensation Testing Intact to touch and monofilament testing bilaterally: Yes Pulse Check Posterior Tibialis and Dorsalis pulse intact bilaterally: Yes Comments Deformity with Right foot some hammer toe and callus formation, nail thickening. No ulceration. Reduced monofilament sensation Right foot      Results for orders placed or performed during the hospital encounter of 10/29/20  Blood Culture (routine x 2)   Specimen: BLOOD  Result Value Ref Range   Specimen Description BLOOD BLOOD LEFT FOREARM    Special Requests      BOTTLES DRAWN AEROBIC AND ANAEROBIC Blood Culture results may not be optimal due to an inadequate volume of blood received in culture bottles   Culture      NO GROWTH 5 DAYS Performed at [facility], [address] [place], [place] [address]    Report Status 11/03/2020 FINAL   Blood Culture (routine x 2)   Specimen: BLOOD  Result Value Ref Range   Specimen Description BLOOD BLOOD LEFT FOREARM    Special Requests      BOTTLES DRAWN AEROBIC AND ANAEROBIC Blood Culture adequate volume   Culture      NO GROWTH 5 DAYS Performed at [facility], [address] [place], [place] [address]    Report Status 11/03/2020 FINAL   Basic metabolic panel  Result Value Ref Range    Sodium 130 (L) 135 - 145 mmol/L   Potassium 3.4 (L) 3.5 - 5.1 mmol/L   Chloride 97 (L) 98 - 111 mmol/L   CO2 21 (L) 22 - 32 mmol/L   Glucose, Bld 131 (H) 70 - 99 mg/dL   BUN 21 8 - 23 mg/dL   Creatinine, Ser 1.01 (H) 0.44 - 1.00 mg/dL   Calcium 8.4 (L) 8.9 - 10.3 mg/dL   GFR, Estimated 53 (L) >60 mL/min   Anion gap 12 5 - 15  CBC  Result Value Ref Range   WBC 5.4 4.0 - 10.5 K/uL   RBC 5.22 (H) 3.87 - 5.11 MIL/uL   Hemoglobin 10.1 (L) 12.0 - 15.0 g/dL   HCT 32.3 (L) 36.0 - 46.0 %   MCV 61.9 (L) 80.0 - 100.0 fL   MCH 19.3 (L) 26.0 - 34.0 pg   MCHC 31.3 30.0 - 36.0 g/dL   RDW 15.9 (H) 11.5 - 15.5 %   Platelets 177 150 - 400 K/uL   nRBC 0.0 0.0 - 0.2 %  Urinalysis, Complete w Microscopic  Result Value Ref Range   Color, Urine YELLOW (A) YELLOW   APPearance CLOUDY (A) CLEAR   Specific Gravity, Urine 1.028 1.005 - 1.030   pH 5.0 5.0 - 8.0   Glucose, UA NEGATIVE NEGATIVE mg/dL   Hgb urine dipstick LARGE (A) NEGATIVE   Bilirubin Urine NEGATIVE NEGATIVE   Ketones, ur 5 (A) NEGATIVE mg/dL   Protein, ur NEGATIVE NEGATIVE mg/dL   Nitrite POSITIVE (A) NEGATIVE   Leukocytes,Ua LARGE (A) NEGATIVE   RBC / HPF 21-50 0 - 5 RBC/hpf   WBC, UA >50 (H) 0 - 5 WBC/hpf   Bacteria, UA MANY (A) NONE SEEN   Squamous Epithelial /  LPF 0-5 0 - 5   Hyaline Casts, UA PRESENT   Hepatic function panel  Result Value Ref Range   Total Protein 7.0 6.5 - 8.1 g/dL   Albumin 3.2 (L) 3.5 - 5.0 g/dL   AST 26 15 - 41 U/L   ALT 14 0 - 44 U/L   Alkaline Phosphatase 53 38 - 126 U/L   Total Bilirubin 0.6 0.3 - 1.2 mg/dL   Bilirubin, Direct <0.1 0.0 - 0.2 mg/dL   Indirect Bilirubin NOT CALCULATED 0.3 - 0.9 mg/dL  Lactic acid, plasma  Result Value Ref Range   Lactic Acid, Venous 1.7 0.5 - 1.9 mmol/L  Lactic acid, plasma  Result Value Ref Range   Lactic Acid, Venous 1.6 0.5 - 1.9 mmol/L  Procalcitonin  Result Value Ref Range   Procalcitonin <0.10 ng/mL  Triglycerides  Result Value Ref Range   Triglycerides  127 <150 mg/dL  Fibrinogen  Result Value Ref Range   Fibrinogen 527 (H) 210 - 475 mg/dL  Hemoglobin A1c  Result Value Ref Range   Hgb A1c MFr Bld 7.0 (H) 4.8 - 5.6 %   Mean Plasma Glucose 154.2 mg/dL  Lipid panel  Result Value Ref Range   Cholesterol 149 0 - 200 mg/dL   Triglycerides 81 <150 mg/dL   HDL 36 (L) >40 mg/dL   Total CHOL/HDL Ratio 4.1 RATIO   VLDL 16 0 - 40 mg/dL   LDL Cholesterol 97 0 - 99 mg/dL  Ferritin  Result Value Ref Range   Ferritin 849 (H) 11 - 307 ng/mL  Lactate dehydrogenase  Result Value Ref Range   LDH 227 (H) 98 - 192 U/L  Magnesium  Result Value Ref Range   Magnesium 1.9 1.7 - 2.4 mg/dL  C-reactive protein  Result Value Ref Range   CRP 17.5 (H) <1.0 mg/dL  Brain natriuretic peptide  Result Value Ref Range   B Natriuretic Peptide 80.9 0.0 - 100.0 pg/mL  Fibrin derivatives D-Dimer ([facility] only)  Result Value Ref Range   Fibrin derivatives D-dimer ([facility]) 735.69 (H) 0.00 - 499.00 ng/mL (FEU)  Hepatitis B surface antigen  Result Value Ref Range   Hepatitis B Surface Ag NON REACTIVE NON REACTIVE  Comprehensive metabolic panel  Result Value Ref Range   Sodium 137 135 - 145 mmol/L   Potassium 4.8 3.5 - 5.1 mmol/L   Chloride 110 98 - 111 mmol/L   CO2 19 (L) 22 - 32 mmol/L   Glucose, Bld 104 (H) 70 - 99 mg/dL   BUN 23 8 - 23 mg/dL   Creatinine, Ser 0.84 0.44 - 1.00 mg/dL   Calcium 7.8 (L) 8.9 - 10.3 mg/dL   Total Protein 6.2 (L) 6.5 - 8.1 g/dL   Albumin 2.7 (L) 3.5 - 5.0 g/dL   AST 119 (H) 15 - 41 U/L   ALT 32 0 - 44 U/L   Alkaline Phosphatase 177 (H) 38 - 126 U/L   Total Bilirubin 0.8 0.3 - 1.2 mg/dL   GFR, Estimated >60 >60 mL/min   Anion gap 8 5 - 15  C-reactive protein  Result Value Ref Range   CRP 17.4 (H) <1.0 mg/dL  Fibrin derivatives D-Dimer ([facility] only)  Result Value Ref Range   Fibrin derivatives D-dimer ([facility]) 709.76 (H) 0.00 - 499.00 ng/mL (FEU)  Ferritin  Result Value Ref Range   Ferritin 1,409 (H) 11 - 307 ng/mL  Magnesium   Result Value Ref Range   Magnesium 2.0 1.7 - 2.4 mg/dL  Glucose, capillary  Result Value Ref Range   Glucose-Capillary 131 (H) 70 - 99 mg/dL  Glucose, capillary  Result Value Ref Range   Glucose-Capillary 89 70 - 99 mg/dL  CBC with Differential/Platelet  Result Value Ref Range   WBC 4.3 4.0 - 10.5 K/uL   RBC 4.65 3.87 - 5.11 MIL/uL   Hemoglobin 9.2 (L) 12.0 - 15.0 g/dL   HCT 28.7 (L) 36.0 - 46.0 %   MCV 61.7 (L) 80.0 - 100.0 fL   MCH 19.8 (L) 26.0 - 34.0 pg   MCHC 32.1 30.0 - 36.0 g/dL   RDW 16.3 (H) 11.5 - 15.5 %   Platelets 178 150 - 400 K/uL   nRBC 0.0 0.0 - 0.2 %   Neutrophils Relative % 48 %   Neutro Abs 2.1 1.7 - 7.7 K/uL   Lymphocytes Relative 45 %   Lymphs Abs 2.0 0.7 - 4.0 K/uL   Monocytes Relative 6 %   Monocytes Absolute 0.3 0.1 - 1.0 K/uL   Eosinophils Relative 0 %   Eosinophils Absolute 0.0 0.0 - 0.5 K/uL   Basophils Relative 0 %   Basophils Absolute 0.0 0.0 - 0.1 K/uL   Immature Granulocytes 1 %   Abs Immature Granulocytes 0.03 0.00 - 0.07 K/uL  Glucose, capillary  Result Value Ref Range   Glucose-Capillary 74 70 - 99 mg/dL  Glucose, capillary  Result Value Ref Range   Glucose-Capillary 88 70 - 99 mg/dL  Comprehensive metabolic panel  Result Value Ref Range   Sodium 141 135 - 145 mmol/L   Potassium 4.8 3.5 - 5.1 mmol/L   Chloride 110 98 - 111 mmol/L   CO2 20 (L) 22 - 32 mmol/L   Glucose, Bld 178 (H) 70 - 99 mg/dL   BUN 28 (H) 8 - 23 mg/dL   Creatinine, Ser 0.93 0.44 - 1.00 mg/dL   Calcium 7.9 (L) 8.9 - 10.3 mg/dL   Total Protein 6.3 (L) 6.5 - 8.1 g/dL   Albumin 2.7 (L) 3.5 - 5.0 g/dL   AST 186 (H) 15 - 41 U/L   ALT 70 (H) 0 - 44 U/L   Alkaline Phosphatase 382 (H) 38 - 126 U/L   Total Bilirubin 1.4 (H) 0.3 - 1.2 mg/dL   GFR, Estimated 59 (L) >60 mL/min   Anion gap 11 5 - 15  C-reactive protein  Result Value Ref Range   CRP 15.5 (H) <1.0 mg/dL  Ferritin  Result Value Ref Range   Ferritin 1,783 (H) 11 - 307 ng/mL  Glucose, capillary  Result  Value Ref Range   Glucose-Capillary 105 (H) 70 - 99 mg/dL  Glucose, capillary  Result Value Ref Range   Glucose-Capillary 191 (H) 70 - 99 mg/dL  D-dimer, quantitative (not at [facility])  Result Value Ref Range   D-Dimer, Quant 0.73 (H) 0.00 - 0.50 ug/mL-FEU  Glucose, capillary  Result Value Ref Range   Glucose-Capillary 159 (H) 70 - 99 mg/dL  Glucose, capillary  Result Value Ref Range   Glucose-Capillary 178 (H) 70 - 99 mg/dL  Comprehensive metabolic panel  Result Value Ref Range   Sodium 140 135 - 145 mmol/L   Potassium 4.4 3.5 - 5.1 mmol/L   Chloride 110 98 - 111 mmol/L   CO2 20 (L) 22 - 32 mmol/L   Glucose, Bld 213 (H) 70 - 99 mg/dL   BUN 37 (H) 8 - 23 mg/dL   Creatinine, Ser 1.13 (H) 0.44 - 1.00 mg/dL   Calcium 8.2 (L) 8.9 - 10.3 mg/dL  Total Protein 6.6 6.5 - 8.1 g/dL   Albumin 2.7 (L) 3.5 - 5.0 g/dL   AST 82 (H) 15 - 41 U/L   ALT 63 (H) 0 - 44 U/L   Alkaline Phosphatase 369 (H) 38 - 126 U/L   Total Bilirubin 1.0 0.3 - 1.2 mg/dL   GFR, Estimated 47 (L) >60 mL/min   Anion gap 10 5 - 15  C-reactive protein  Result Value Ref Range   CRP 9.6 (H) <1.0 mg/dL  Ferritin  Result Value Ref Range   Ferritin 2,026 (H) 11 - 307 ng/mL  Glucose, capillary  Result Value Ref Range   Glucose-Capillary 187 (H) 70 - 99 mg/dL  Glucose, capillary  Result Value Ref Range   Glucose-Capillary 211 (H) 70 - 99 mg/dL  D-dimer, quantitative (not at [facility])  Result Value Ref Range   D-Dimer, Quant 0.57 (H) 0.00 - 0.50 ug/mL-FEU  Glucose, capillary  Result Value Ref Range   Glucose-Capillary 211 (H) 70 - 99 mg/dL  Glucose, capillary  Result Value Ref Range   Glucose-Capillary 251 (H) 70 - 99 mg/dL  Glucose, capillary  Result Value Ref Range   Glucose-Capillary 237 (H) 70 - 99 mg/dL  C-reactive protein  Result Value Ref Range   CRP 5.9 (H) <1.0 mg/dL  Comprehensive metabolic panel  Result Value Ref Range   Sodium 142 135 - 145 mmol/L   Potassium 4.4 3.5 - 5.1 mmol/L   Chloride 110 98 -  111 mmol/L   CO2 23 22 - 32 mmol/L   Glucose, Bld 182 (H) 70 - 99 mg/dL   BUN 35 (H) 8 - 23 mg/dL   Creatinine, Ser 1.03 (H) 0.44 - 1.00 mg/dL   Calcium 8.4 (L) 8.9 - 10.3 mg/dL   Total Protein 6.4 (L) 6.5 - 8.1 g/dL   Albumin 2.8 (L) 3.5 - 5.0 g/dL   AST 82 (H) 15 - 41 U/L   ALT 56 (H) 0 - 44 U/L   Alkaline Phosphatase 330 (H) 38 - 126 U/L   Total Bilirubin 0.7 0.3 - 1.2 mg/dL   GFR, Estimated 52 (L) >60 mL/min   Anion gap 9 5 - 15  Ferritin  Result Value Ref Range   Ferritin 1,371 (H) 11 - 307 ng/mL  Magnesium  Result Value Ref Range   Magnesium 2.2 1.7 - 2.4 mg/dL  Phosphorus  Result Value Ref Range   Phosphorus 2.5 2.5 - 4.6 mg/dL  Lactate dehydrogenase  Result Value Ref Range   LDH 228 (H) 98 - 192 U/L  CBC with Differential/Platelet  Result Value Ref Range   WBC 5.2 4.0 - 10.5 K/uL   RBC 5.31 (H) 3.87 - 5.11 MIL/uL   Hemoglobin 10.4 (L) 12.0 - 15.0 g/dL   HCT 32.1 (L) 36.0 - 46.0 %   MCV 60.5 (L) 80.0 - 100.0 fL   MCH 19.6 (L) 26.0 - 34.0 pg   MCHC 32.4 30.0 - 36.0 g/dL   RDW 16.4 (H) 11.5 - 15.5 %   Platelets 337 150 - 400 K/uL   nRBC 0.0 0.0 - 0.2 %   Neutrophils Relative % 59 %   Neutro Abs 3.1 1.7 - 7.7 K/uL   Lymphocytes Relative 29 %   Lymphs Abs 1.5 0.7 - 4.0 K/uL   Monocytes Relative 10 %   Monocytes Absolute 0.5 0.1 - 1.0 K/uL   Eosinophils Relative 0 %   Eosinophils Absolute 0.0 0.0 - 0.5 K/uL   Basophils Relative 0 %  Basophils Absolute 0.0 0.0 - 0.1 K/uL   WBC Morphology MILD LEFT SHIFT (1-5% METAS, OCC MYELO, OCC BANDS)    Smear Review Normal platelet morphology    Immature Granulocytes 2 %   Abs Immature Granulocytes 0.08 (H) 0.00 - 0.07 K/uL  Vitamin B12  Result Value Ref Range   Vitamin B-12 1,980 (H) 180 - 914 pg/mL  Folate  Result Value Ref Range   Folate 21.4 >5.9 ng/mL  Iron and TIBC  Result Value Ref Range   Iron 111 28 - 170 ug/dL   TIBC 157 (L) 250 - 450 ug/dL   Saturation Ratios 71 (H) 10.4 - 31.8 %   UIBC 46 ug/dL   Reticulocytes  Result Value Ref Range   Retic Ct Pct 0.8 0.4 - 3.1 %   RBC. 5.20 (H) 3.87 - 5.11 MIL/uL   Retic Count, Absolute 43.2 19.0 - 186.0 K/uL   Immature Retic Fract 13.9 2.3 - 15.9 %  Haptoglobin  Result Value Ref Range   Haptoglobin 198 41 - 333 mg/dL  Glucose, capillary  Result Value Ref Range   Glucose-Capillary 253 (H) 70 - 99 mg/dL  Glucose, capillary  Result Value Ref Range   Glucose-Capillary 177 (H) 70 - 99 mg/dL  D-dimer, quantitative (not at [facility])  Result Value Ref Range   D-Dimer, Quant 0.41 0.00 - 0.50 ug/mL-FEU  POC SARS Coronavirus 2 Ag-ED - Nasal Swab (BD Veritor Kit)  Result Value Ref Range   SARS Coronavirus 2 Ag Positive (A) Negative  POC SARS Coronavirus 2 Ag-ED -  Result Value Ref Range   SARS Coronavirus 2 Ag POSITIVE (A) NEGATIVE  Troponin I (High Sensitivity)  Result Value Ref Range   Troponin I (High Sensitivity) 45 (H) <18 ng/L  Troponin I (High Sensitivity)  Result Value Ref Range   Troponin I (High Sensitivity) 47 (H) <18 ng/L  Troponin I (High Sensitivity)  Result Value Ref Range   Troponin I (High Sensitivity) 57 (H) <18 ng/L  Troponin I (High Sensitivity)  Result Value Ref Range   Troponin I (High Sensitivity) 66 (H) <18 ng/L      Assessment & Plan:   Problem List Items Addressed This Visit     Microcytic anemia   Relevant Orders   CBC with Differential/Platelet   HLD (hyperlipidemia)   Relevant Orders   COMPLETE METABOLIC PANEL WITH GFR   Lipid panel   TSH   History of cerebrovascular accident (CVA) with residual deficit   Essential (primary) hypertension   Relevant Medications   gabapentin (NEURONTIN) 100 MG capsule   Other Relevant Orders   COMPLETE METABOLIC PANEL WITH GFR   CBC with Differential/Platelet   Magnesium   Drug-induced myopathy   Diabetic peripheral neuropathy (HCC)   Relevant Medications   gabapentin (NEURONTIN) 100 MG capsule   Chronic gout of multiple sites   Relevant Medications   traMADol  (ULTRAM) 50 MG tablet   Other Relevant Orders   Uric acid   DG Knee Complete 4 Views Right   Other Visit Diagnoses     Controlled type 2 diabetes mellitus with diabetic neuropathy, without long-term current use of insulin (HCC)    -  Primary   Relevant Orders   Hemoglobin A1c   Chronic pain of right knee       Relevant Orders   DG Knee Complete 4 Views Right       Updated Health Maintenance information Fasting labs ordered today Encouraged improvement to lifestyle with diet and exercise  Goal of weight loss  Diabetes, controlled Complicated w neuropathy Last A1c 7 range check labs Continue Metformin XR 545m AM only   Atherosclerosis of Aorta Identified on CT image 10/2020 On statin  HLD Continue statin therapy  HTN Mild elevated BP Repeat improved.  Knee Pain, R Likely arthritis Check Xray Follow up consider injection or refer to Ortho  Chronic Gout See above Knee, may be secondary to gout On therapy currently PRN Tramadol renew  Orders Placed This Encounter  Procedures   DG Knee Complete 4 Views Right    Standing Status:   Future    Standing Expiration Date:   12/28/2022    Order Specific Question:   Reason for Exam (SYMPTOM  OR DIAGNOSIS REQUIRED)    Answer:   chronic gout arthritis Right knee pain deformity swelling    Order Specific Question:   Preferred imaging location?    Answer:   [facility]  COMPLETE METABOLIC PANEL WITH GFR   CBC with Differential/Platelet   Lipid panel    Order Specific Question:   Has the patient fasted?    Answer:   Yes   Hemoglobin A1c   Magnesium   Uric acid   TSH     Meds ordered this encounter  Medications   traMADol (ULTRAM) 50 MG tablet    Sig: Take 1-2 tablets (50-100 mg total) by mouth 2 (two) times daily as needed (Gout).    Dispense:  20 tablet    Refill:  2   gabapentin (NEURONTIN) 100 MG capsule    Sig: Take 1 capsule (100 mg total) by mouth 2 (two) times daily.    Dispense:  180 capsule     Refill:  3     Follow up plan: Return in about 2 weeks (around 01/10/2022) for 2 weeks follow-up Right knee injection.  ANobie Putnam [facility] 12/27/2021, 9:53 AM

## 2021-12-28 LAB — COMPLETE METABOLIC PANEL WITH GFR
AG Ratio: 1.3 (calc) (ref 1.0–2.5)
ALT: 9 U/L (ref 6–29)
AST: 12 U/L (ref 10–35)
Albumin: 4 g/dL (ref 3.6–5.1)
Alkaline phosphatase (APISO): 81 U/L (ref 37–153)
BUN/Creatinine Ratio: 30 (calc) — ABNORMAL HIGH (ref 6–22)
BUN: 35 mg/dL — ABNORMAL HIGH (ref 7–25)
CO2: 27 mmol/L (ref 20–32)
Calcium: 9.3 mg/dL (ref 8.6–10.4)
Chloride: 100 mmol/L (ref 98–110)
Creat: 1.15 mg/dL — ABNORMAL HIGH (ref 0.60–0.95)
Globulin: 3.1 g/dL (calc) (ref 1.9–3.7)
Glucose, Bld: 124 mg/dL — ABNORMAL HIGH (ref 65–99)
Potassium: 4.7 mmol/L (ref 3.5–5.3)
Sodium: 135 mmol/L (ref 135–146)
Total Bilirubin: 0.5 mg/dL (ref 0.2–1.2)
Total Protein: 7.1 g/dL (ref 6.1–8.1)
eGFR: 45 mL/min/{1.73_m2} — ABNORMAL LOW (ref >=60)

## 2021-12-28 LAB — LIPID PANEL
Cholesterol: 118 mg/dL (ref <200)
HDL: 52 mg/dL (ref >=50)
LDL Cholesterol (Calc): 45 mg/dL (calc)
Non-HDL Cholesterol (Calc): 66 mg/dL (calc) (ref <130)
Total CHOL/HDL Ratio: 2.3 (calc) (ref <5.0)
Triglycerides: 124 mg/dL (ref <150)

## 2021-12-28 LAB — CBC WITH DIFFERENTIAL/PLATELET
Absolute Monocytes: 440 cells/uL (ref 200–950)
Basophils Absolute: 19 cells/uL (ref 0–200)
Basophils Relative: 0.3 %
Eosinophils Absolute: 93 cells/uL (ref 15–500)
Eosinophils Relative: 1.5 %
HCT: 32.8 % — ABNORMAL LOW (ref 35.0–45.0)
Hemoglobin: 9.7 g/dL — ABNORMAL LOW (ref 11.7–15.5)
Lymphs Abs: 2282 cells/uL (ref 850–3900)
MCH: 19.4 pg — ABNORMAL LOW (ref 27.0–33.0)
MCHC: 29.6 g/dL — ABNORMAL LOW (ref 32.0–36.0)
MCV: 65.7 fL — ABNORMAL LOW (ref 80.0–100.0)
MPV: 10 fL (ref 7.5–12.5)
Monocytes Relative: 7.1 %
Neutro Abs: 3367 cells/uL (ref 1500–7800)
Neutrophils Relative %: 54.3 %
Platelets: 283 10*3/uL (ref 140–400)
RBC: 4.99 10*6/uL (ref 3.80–5.10)
RDW: 16.9 % — ABNORMAL HIGH (ref 11.0–15.0)
Total Lymphocyte: 36.8 %
WBC: 6.2 10*3/uL (ref 3.8–10.8)

## 2021-12-28 LAB — TSH: TSH: 0.74 mIU/L (ref 0.40–4.50)

## 2021-12-28 LAB — URIC ACID: Uric Acid, Serum: 9.1 mg/dL — ABNORMAL HIGH (ref 2.5–7.0)

## 2021-12-28 LAB — MAGNESIUM: Magnesium: 2.2 mg/dL (ref 1.5–2.5)

## 2021-12-28 LAB — HEMOGLOBIN A1C
Hgb A1c MFr Bld: 8.4 % of total Hgb — ABNORMAL HIGH (ref <5.7)
Mean Plasma Glucose: 194 mg/dL
eAG (mmol/L): 10.8 mmol/L

## 2022-01-11 ENCOUNTER — Ambulatory Visit (INDEPENDENT_AMBULATORY_CARE_PROVIDER_SITE_OTHER): Payer: Medicare Other | Admitting: Family Medicine

## 2022-01-11 ENCOUNTER — Encounter: Payer: Self-pay | Admitting: Family Medicine

## 2022-01-11 ENCOUNTER — Other Ambulatory Visit: Payer: Self-pay

## 2022-01-11 VITALS — BP 118/68 | HR 81 | Ht <= 58 in | Wt 126.8 lb

## 2022-01-11 DIAGNOSIS — M1711 Unilateral primary osteoarthritis, right knee: Secondary | ICD-10-CM | POA: Diagnosis not present

## 2022-01-11 DIAGNOSIS — M25561 Pain in right knee: Secondary | ICD-10-CM

## 2022-01-11 DIAGNOSIS — G8929 Other chronic pain: Secondary | ICD-10-CM

## 2022-01-11 MED ORDER — METHYLPREDNISOLONE ACETATE 40 MG/ML IJ SUSP
40.0000 mg | Freq: Once | INTRAMUSCULAR | Status: AC
  Administered 2022-01-11: 40 mg via INTRA_ARTICULAR

## 2022-01-11 MED ORDER — LIDOCAINE HCL (PF) 1 % IJ SOLN
4.0000 mL | Freq: Once | INTRAMUSCULAR | Status: AC
  Administered 2022-01-11: 4 mL

## 2022-01-11 NOTE — Progress Notes (Signed)
? ?Subjective:  ? ? Patient ID: Gabriela Robles, female    DOB: Apr 13, 1929, 88 y.o.   MRN: [id number] ? ?Gabriela Robles is a 88 y.o. female presenting on 01/11/2022 for Knee Pain ? ? ?HPI ? ?R Knee Osteoarthritis / R Knee pain ?Reports chronic problem swelling joint pain R knee, she is more active in Spring Summer. Does worsening pain. Due today for Knee injection ?Recent X-rays done ? ? ?Depression screen [facility] 2/9 01/11/2022 12/27/2021 11/07/2020  ?Decreased Interest 0 0 0  ?Down, Depressed, Hopeless 0 0 0  ?PHQ - 2 Score 0 0 0  ?Altered sleeping 0 0 -  ?Tired, decreased energy 1 1 -  ?Change in appetite 0 0 -  ?Feeling bad or failure about yourself  0 0 -  ?Trouble concentrating 0 0 -  ?Moving slowly or fidgety/restless 0 0 -  ?Suicidal thoughts 0 0 -  ?PHQ-9 Score 1 1 -  ?Difficult doing work/chores Not difficult at all Not difficult at all -  ? ? ?Social History  ? ?Tobacco Use  ? Smoking status: Never  ? Smokeless tobacco: Never  ?Vaping Use  ? Vaping Use: Never used  ?Substance Use Topics  ? Alcohol use: No  ?  Alcohol/week: 0.0 standard drinks  ? Drug use: No  ? ? ?Review of Systems ?Per HPI unless specifically indicated above ? ?   ?Objective:  ?  ?BP 118/68   Pulse 81   Ht '4\' 10"'  (1.473 m)   Wt 126 lb 12.8 oz (57.5 kg)   SpO2 93%   BMI 26.50 kg/m?   ?Wt Readings from Last 3 Encounters:  ?01/11/22 126 lb 12.8 oz (57.5 kg)  ?12/27/21 125 lb 9.6 oz (57 kg)  ?07/09/21 123 lb (55.8 kg)  ?  ?Physical Exam ? ?________________________________________________________ ?PROCEDURE NOTE ?Date: 01/11/22 ?Right knee cortisone injection ?Discussed benefits and risks (including pain, bleeding, infection, steroid flare). ?Verbal consent given by patient. ?Medication:  1 cc Depo-medrol 50m and 4 cc Lidocaine 1% without epi ?Time Out taken  ?Landmarks identified. Area cleansed with alcohol wipes. Using 21 gauge and 1, 1/2 inch needle, Right knee joint space was injected (with above listed medication) via lateral approach  cold spray used for superficial anesthetic. Sterile bandage placed. Patient tolerated procedure well without bleeding or paresthesias. No complications. ? ? ? ?I have personally reviewed the radiology report from Right Knee X-ray on 12/27/21. ? ?CLINICAL DATA:  Chronic gout.  Arthritis. ?  ?EXAM: ?RIGHT KNEE - COMPLETE 4+ VIEW ?  ?COMPARISON:  None. ?  ?FINDINGS: ?There is diffuse decreased bone mineralization. Mild lateral ?compartment joint space narrowing. Mild-to-moderate peripheral ?medial and lateral compartment degenerative osteophytes. Moderate ?patellofemoral joint space narrowing and peripheral osteophytosis. ?No joint effusion. No acute fracture is seen. No dislocation. ?Vascular calcifications are noted. ?  ?IMPRESSION: ?Moderate patellofemoral and mild medial and lateral compartment ?osteoarthritis. ?  ?  ?Electronically Signed ?  By: RYvonne KendallM.D. ?  On: 12/28/2021 15:52 ?Results for orders placed or performed in visit on 12/27/21  ?COMPLETE METABOLIC PANEL WITH GFR  ?Result Value Ref Range  ? Glucose, Bld 124 (H) 65 - 99 mg/dL  ? BUN 35 (H) 7 - 25 mg/dL  ? Creat 1.15 (H) 0.60 - 0.95 mg/dL  ? eGFR 45 (L) > OR = 60 mL/min/1.767m ? BUN/Creatinine Ratio 30 (H) 6 - 22 (calc)  ? Sodium 135 135 - 146 mmol/L  ? Potassium 4.7 3.5 - 5.3 mmol/L  ? Chloride 100 98 -  110 mmol/L  ? CO2 27 20 - 32 mmol/L  ? Calcium 9.3 8.6 - 10.4 mg/dL  ? Total Protein 7.1 6.1 - 8.1 g/dL  ? Albumin 4.0 3.6 - 5.1 g/dL  ? Globulin 3.1 1.9 - 3.7 g/dL (calc)  ? AG Ratio 1.3 1.0 - 2.5 (calc)  ? Total Bilirubin 0.5 0.2 - 1.2 mg/dL  ? Alkaline phosphatase (APISO) 81 37 - 153 U/L  ? AST 12 10 - 35 U/L  ? ALT 9 6 - 29 U/L  ?CBC with Differential/Platelet  ?Result Value Ref Range  ? WBC 6.2 3.8 - 10.8 Thousand/uL  ? RBC 4.99 3.80 - 5.10 Million/uL  ? Hemoglobin 9.7 (L) 11.7 - 15.5 g/dL  ? HCT 32.8 (L) 35.0 - 45.0 %  ? MCV 65.7 (L) 80.0 - 100.0 fL  ? MCH 19.4 (L) 27.0 - 33.0 pg  ? MCHC 29.6 (L) 32.0 - 36.0 g/dL  ? RDW 16.9 (H) 11.0 - 15.0  %  ? Platelets 283 140 - 400 Thousand/uL  ? MPV 10.0 7.5 - 12.5 fL  ? Neutro Abs 3,367 1,500 - 7,800 cells/uL  ? Lymphs Abs 2,282 850 - 3,900 cells/uL  ? Absolute Monocytes 440 200 - 950 cells/uL  ? Eosinophils Absolute 93 15 - 500 cells/uL  ? Basophils Absolute 19 0 - 200 cells/uL  ? Neutrophils Relative % 54.3 %  ? Total Lymphocyte 36.8 %  ? Monocytes Relative 7.1 %  ? Eosinophils Relative 1.5 %  ? Basophils Relative 0.3 %  ? Smear Review    ?Lipid panel  ?Result Value Ref Range  ? Cholesterol 118 <200 mg/dL  ? HDL 52 > OR = 50 mg/dL  ? Triglycerides 124 <150 mg/dL  ? LDL Cholesterol (Calc) 45 mg/dL (calc)  ? Total CHOL/HDL Ratio 2.3 <5.0 (calc)  ? Non-HDL Cholesterol (Calc) 66 <130 mg/dL (calc)  ?Hemoglobin A1c  ?Result Value Ref Range  ? Hgb A1c MFr Bld 8.4 (H) <5.7 % of total Hgb  ? Mean Plasma Glucose 194 mg/dL  ? eAG (mmol/L) 10.8 mmol/L  ?Magnesium  ?Result Value Ref Range  ? Magnesium 2.2 1.5 - 2.5 mg/dL  ?Uric acid  ?Result Value Ref Range  ? Uric Acid, Serum 9.1 (H) 2.5 - 7.0 mg/dL  ?TSH  ?Result Value Ref Range  ? TSH 0.74 0.40 - 4.50 mIU/L  ? ?   ?Assessment & Plan:  ? ?Problem List Items Addressed This Visit   ?None ?Visit Diagnoses   ? ? Chronic pain of right knee    -  Primary  ? Relevant Medications  ? methylPREDNISolone acetate (DEPO-MEDROL) injection 40 mg (Start on 01/11/2022 11:30 AM)  ? lidocaine (PF) (XYLOCAINE) 1 % injection 4 mL (Start on 01/11/2022 11:30 AM)  ? Primary osteoarthritis of right knee      ? Relevant Medications  ? methylPREDNISolone acetate (DEPO-MEDROL) injection 40 mg (Start on 01/11/2022 11:30 AM)  ? lidocaine (PF) (XYLOCAINE) 1 % injection 4 mL (Start on 01/11/2022 11:30 AM)  ? ?  ?  ?Subacute on chronic R generalized Knee pain and swelling no injury or trauma. ?Hx Gout chronic ?Osteoarthritis advanced on last X-ray imaging ?- No prior history of knee surgery, arthroscopy ? ?Plan: ?Right knee steroid injection today #1, tolerated well, see note above ?Keep using current  therapy  ? ?Meds ordered this encounter  ?Medications  ? methylPREDNISolone acetate (DEPO-MEDROL) injection 40 mg  ? lidocaine (PF) (XYLOCAINE) 1 % injection 4 mL  ? ? ? ? ?Follow  up plan: ?Return if symptoms worsen or fail to improve. ? ? ?Nobie Putnam, DO ?[facility] ?[facility] ?01/11/2022, 11:15 AM ?

## 2022-01-11 NOTE — Patient Instructions (Signed)
Thank you for coming to the office today. ? ?You received a Right Knee Joint steroid injection today. ?- Lidocaine numbing medicine may ease the pain initially for a few hours until it wears off ?- As discussed, you may experience a "steroid flare" this evening or within 24-48 hours, anytime medicine is injected into an inflamed joint it can cause the pain to get worse temporarily ?- Everyone responds differently to these injections, it depends on the patient and the severity of the joint problem, it may provide anywhere from days to weeks, to months of relief. Ideal response is >6 months relief ?- Try to take it easy for next 1-2 days, avoid over activity and strain on joint (limit walking for knee or  ?- Recommend the following: ?  - For swelling - rest, compression sleeve / ACE wrap, elevation, and ice packs as needed for first few days ?  - For pain in future may use heating pad or moist heat as needed ? ?Please schedule a Follow-up Appointment to: No follow-ups on file. ? ?If you have any other questions or concerns, please feel free to call the office or send a message through [place]. You may also schedule an earlier appointment if necessary. ? ?Additionally, you may be receiving a survey about your experience at our office within a few days to 1 week by e-mail or mail. We value your feedback. ? ?Nobie Putnam, DO ?[place] ?

## 2022-03-06 IMAGING — CT CT ABD-PELV W/ CM
2 of 5 series · 16 of 46 positions shown, 18 images · IV contrast (omnipaque)
Comparison: None.

CLINICAL DATA: Weakness.

EXAM:
CT ABDOMEN AND PELVIS WITH CONTRAST
TECHNIQUE: Multidetector CT imaging of the abdomen and pelvis was performed
using the standard protocol following bolus administration of
intravenous contrast.
CONTRAST:  75mL OMNIPAQUE IOHEXOL 300 MG/ML  SOLN

[Series 2: routine abd/pel with · axial · 0.87mm/px · z∈[-915,-550]mm · 13 of 83 slices shown, 15 images]
[im 5/83  soft-tissue]
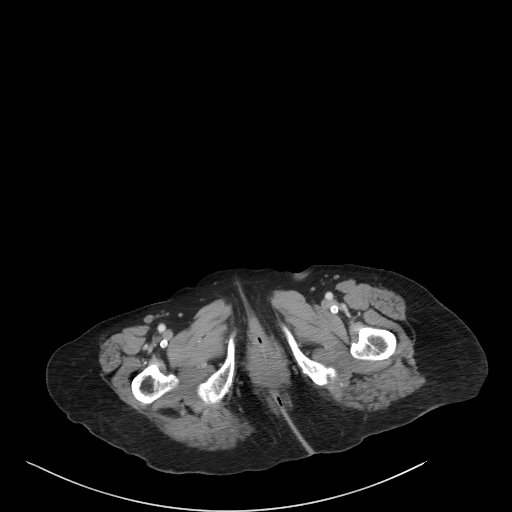
[im 5/83  bone]
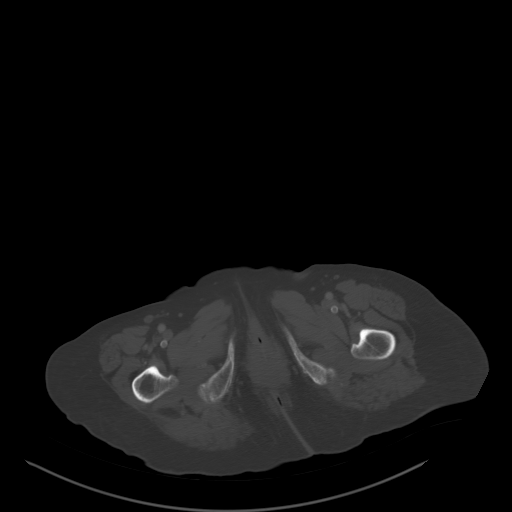
[im 10/83  soft-tissue]
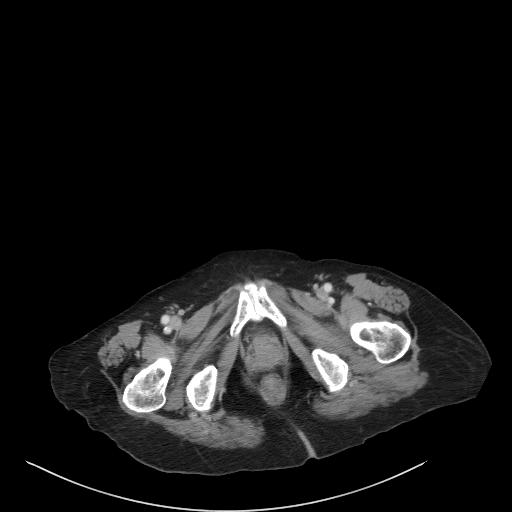
[im 19/83  soft-tissue]
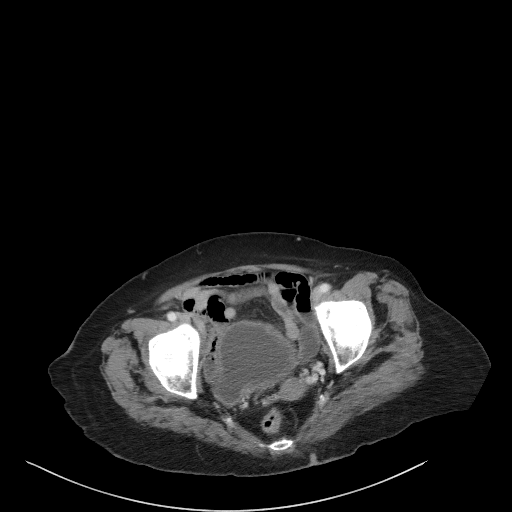
[im 23/83  soft-tissue]
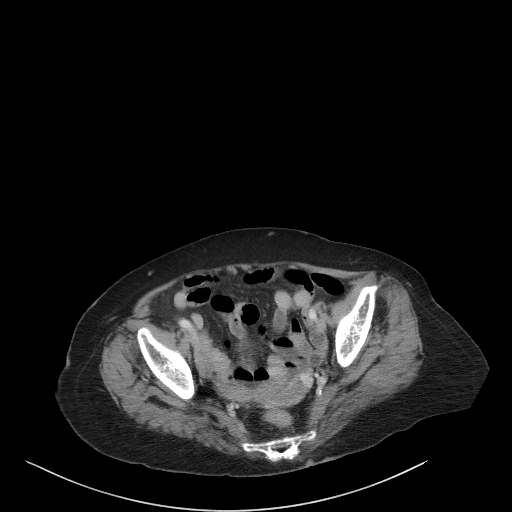
[im 28/83  soft-tissue]
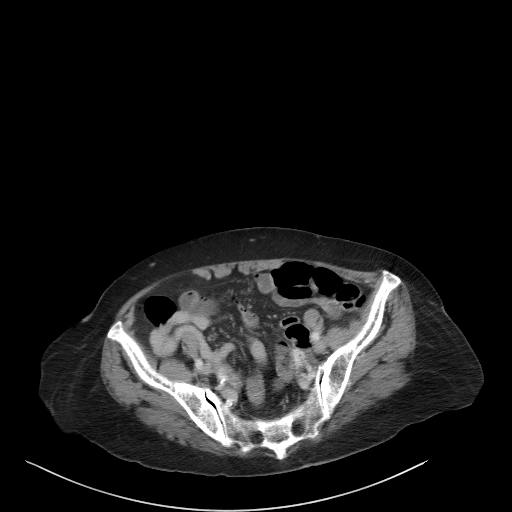
[im 37/83  soft-tissue]
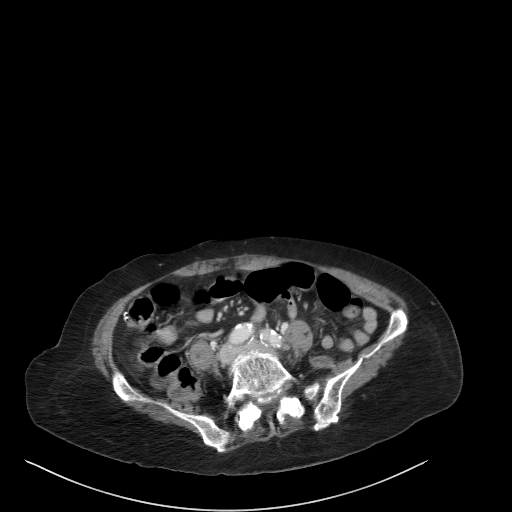
[im 42/83  soft-tissue]
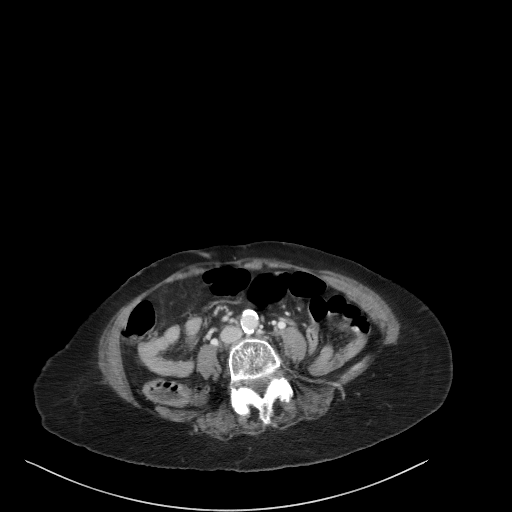
[im 46/83  soft-tissue]
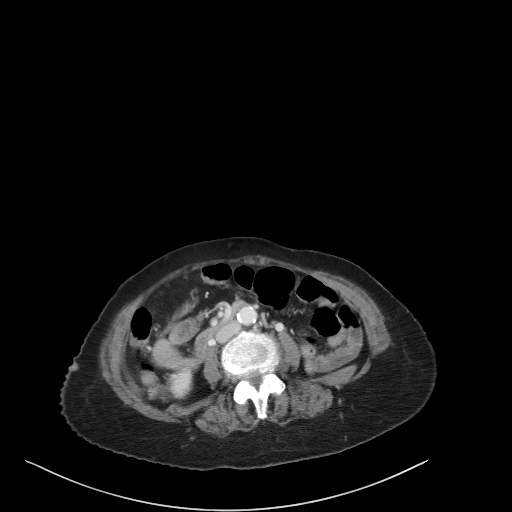
[im 55/83  soft-tissue]
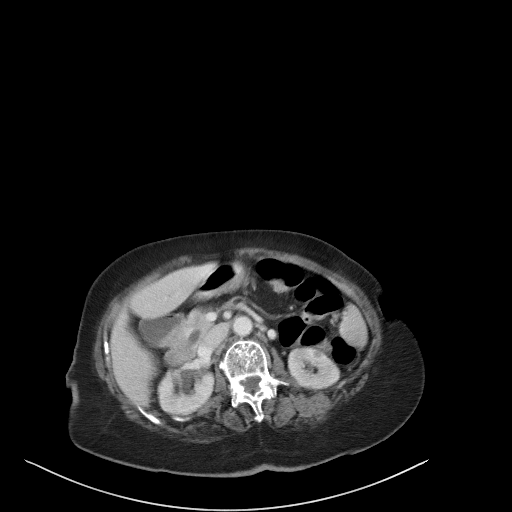
[im 55/83  bone]
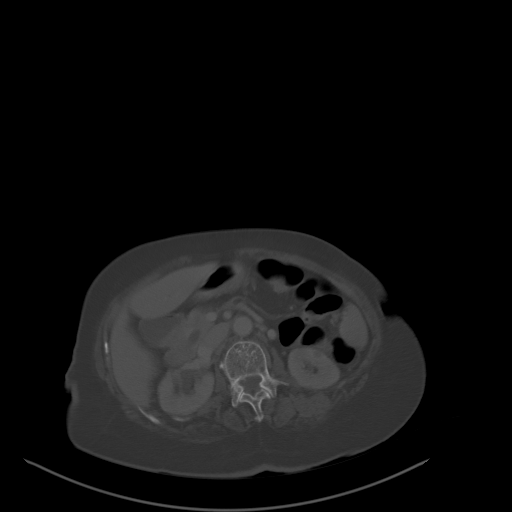
[im 60/83  soft-tissue]
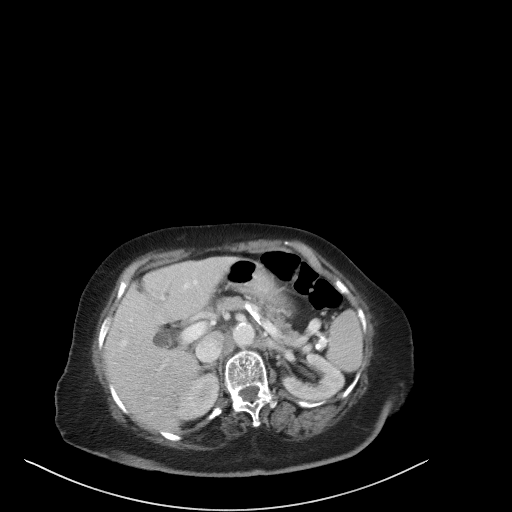
[im 64/83  soft-tissue]
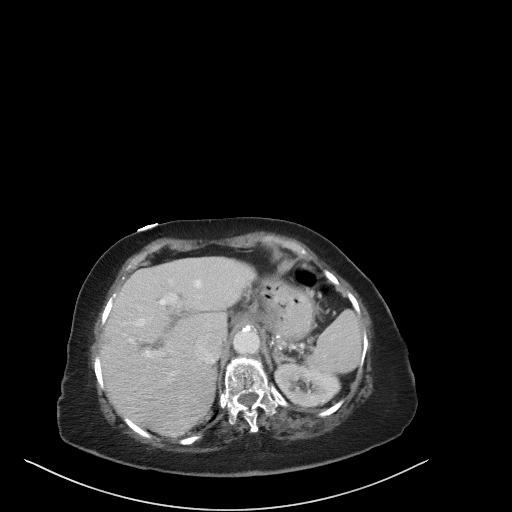
[im 73/83  soft-tissue]
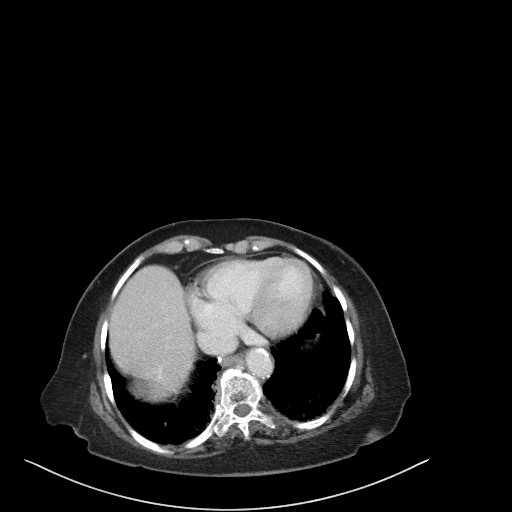
[im 78/83  soft-tissue]
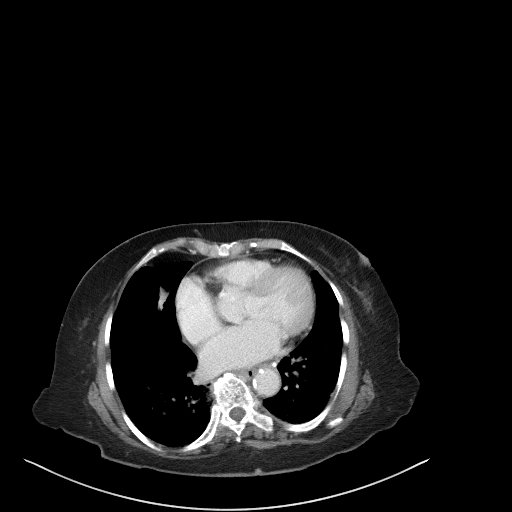

[Series 6: coronal st · coronal · 0.63mm/px · 3 of 79 slices shown]
[im 27/79  soft-tissue]
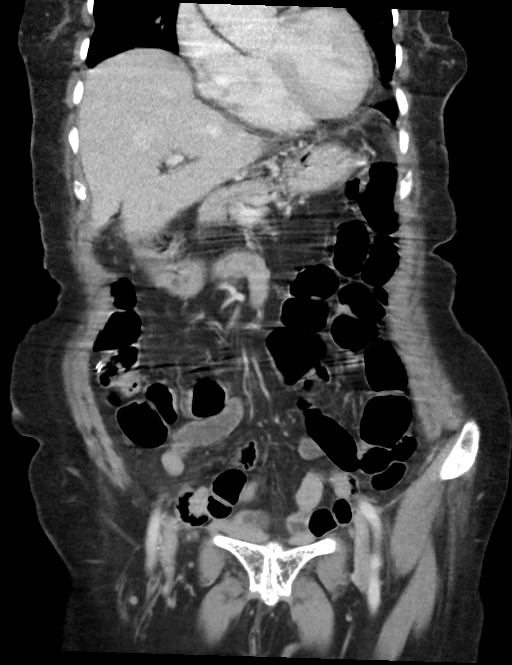
[im 35/79  soft-tissue]
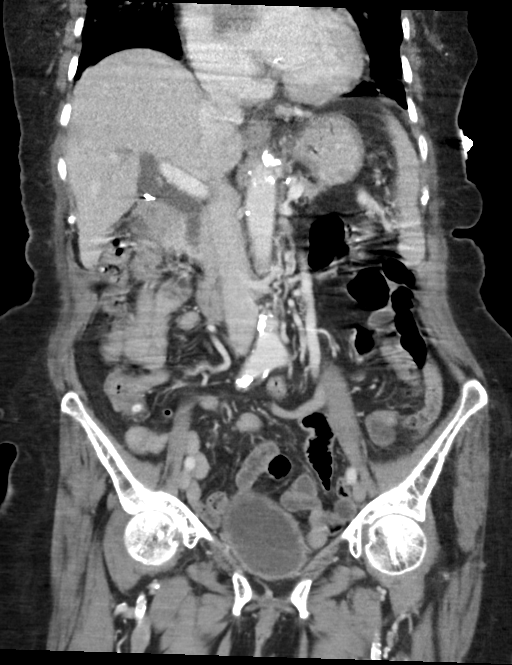
[im 44/79  soft-tissue]
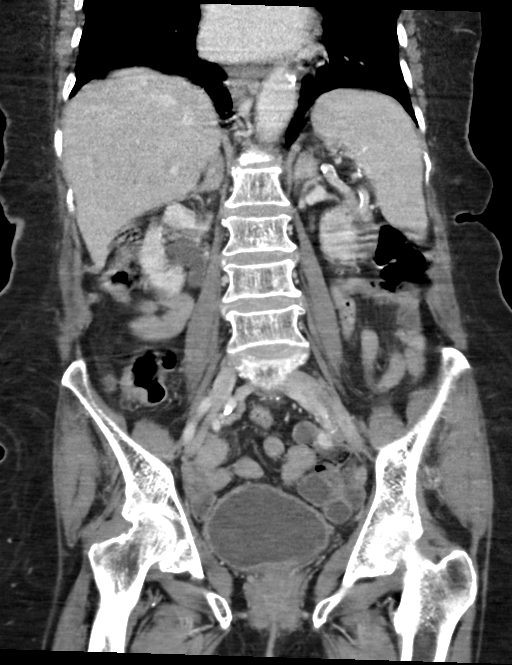

[16 of 46 positions shown; findings below may reference images not displayed]

FINDINGS: Lower chest: Bilateral patchy airspace opacities are noted
concerning for multifocal pneumonia.

Hepatobiliary: No focal liver abnormality is seen. Status post
cholecystectomy. No biliary dilatation.

Pancreas: Unremarkable. No pancreatic ductal dilatation or
surrounding inflammatory changes.

Spleen: Normal in size without focal abnormality.

Adrenals/Urinary Tract: Adrenal glands are unremarkable. Kidneys are
normal, without renal calculi, focal lesion, or hydronephrosis.
Bladder is unremarkable.

Stomach/Bowel: Stomach is within normal limits. Appendix appears
normal. No evidence of bowel wall thickening, distention, or
inflammatory changes.

Vascular/Lymphatic: Aortic atherosclerosis. No enlarged abdominal or
pelvic lymph nodes.

Reproductive: Uterus and bilateral adnexa are unremarkable.

Other: No abdominal wall hernia or abnormality. No abdominopelvic
ascites.

Musculoskeletal: No acute or significant osseous findings.
IMPRESSION: 1. Bilateral patchy airspace opacities are noted concerning for
multifocal pneumonia.
2. Aortic atherosclerosis.
3. No acute abnormality seen in the abdomen or pelvis.

Aortic Atherosclerosis ([id number]-[id number].D).

## 2022-08-07 ENCOUNTER — Other Ambulatory Visit: Payer: Self-pay | Admitting: Family Medicine

## 2022-08-07 DIAGNOSIS — E1140 Type 2 diabetes mellitus with diabetic neuropathy, unspecified: Secondary | ICD-10-CM

## 2022-08-07 NOTE — Telephone Encounter (Signed)
Requested medication (s) are due for refill today: yes  Requested medication (s) are on the active medication list:yes  Last refill:  07/09/21  Future visit scheduled: no  Notes to clinic:  Unable to refill per protocol, appointment needed.      Requested Prescriptions  Pending Prescriptions Disp Refills   metFORMIN (GLUCOPHAGE-XR) 500 MG 24 hr tablet [Pharmacy Med Name: METFORMIN HCL ER 500 MG TABLET] 90 tablet 3    Sig: TAKE 1 TABLET BY MOUTH EVERY DAY WITH BREAKFAST     Endocrinology:  Diabetes - Biguanides Failed - 08/07/2022  2:12 AM      Failed - Cr in normal range and within 360 days    Creat  Date Value Ref Range Status  12/27/2021 1.15 (H) 0.60 - 0.95 mg/dL Final         Failed - HBA1C is between 0 and 7.9 and within 180 days    Hemoglobin A1C  Date Value Ref Range Status  01/13/2020 9.3  Final    Comment:    [facility]   Hgb A1c MFr Bld  Date Value Ref Range Status  12/27/2021 8.4 (H) <5.7 % of total Hgb Final    Comment:    For someone without known diabetes, a hemoglobin A1c value of 6.5% or greater indicates that they may have  diabetes and this should be confirmed with a follow-up  test. . For someone with known diabetes, a value <7% indicates  that their diabetes is well controlled and a value  greater than or equal to 7% indicates suboptimal  control. A1c targets should be individualized based on  duration of diabetes, age, comorbid conditions, and  other considerations. . Currently, no consensus exists regarding use of hemoglobin A1c for diagnosis of diabetes for children. .          Failed - eGFR in normal range and within 360 days    GFR, Est African American  Date Value Ref Range Status  09/28/2019 64 > OR = 60 mL/min/1.47m Final   GFR calc Af Amer  Date Value Ref Range Status  05/12/2020 50 (L) >60 mL/min Final   GFR, Est Non African American  Date Value Ref Range Status  09/28/2019 56 (L) > OR = 60  mL/min/1.729mFinal   GFR, Estimated  Date Value Ref Range Status  11/02/2020 52 (L) >60 mL/min Final    Comment:    (NOTE) Calculated using the CKD-EPI Creatinine Equation (2021)    eGFR  Date Value Ref Range Status  12/27/2021 45 (L) > OR = 60 mL/min/1.7395minal    Comment:    The eGFR is based on the CKD-EPI 2021 equation. To calculate  the new eGFR from a previous Creatinine or Cystatin C result, go to https://www.kidney.org/professionals/ kdoqi/gfr%5Fcalculator          Failed - B12 Level in normal range and within 720 days    Vitamin B-12  Date Value Ref Range Status  11/02/2020 1,980 (H) 180 - 914 pg/mL Final    Comment:    (NOTE) This assay is not validated for testing neonatal or myeloproliferative syndrome specimens for Vitamin B12 levels. Performed at [facility] [address] [id number]       Failed - Valid encounter within last 6 months    Recent Outpatient Visits           6 months ago Chronic pain of right knee   [facility] [place], Devonne Doughty, DO   7 months ago Controlled type 2 diabetes mellitus with diabetic neuropathy, without long-term current use of insulin ([place])   [place], [place]   1 year ago Controlled type 2 diabetes mellitus with diabetic neuropathy, without long-term current use of insulin ([place])   [facility], Devonne Doughty, DO   1 year ago Mixed hyperlipidemia   [facility], Devonne Doughty, DO   1 year ago Pneumonia due to COVID-19 virus   [place], DO              Passed - CBC within normal limits and completed in the last 12 months    WBC  Date Value Ref Range Status  12/27/2021 6.2 3.8 - 10.8 Thousand/uL Final   RBC  Date Value Ref Range Status  12/27/2021 4.99 3.80 - 5.10 Million/uL Final   Hemoglobin  Date Value Ref Range Status   12/27/2021 9.7 (L) 11.7 - 15.5 g/dL Final  01/30/2016 8.4 (L) 11.1 - 15.9 g/dL Final   HCT  Date Value Ref Range Status  12/27/2021 32.8 (L) 35.0 - 45.0 % Final   Hematocrit  Date Value Ref Range Status  01/30/2016 27.0 (L) 34.0 - 46.6 % Final   MCHC  Date Value Ref Range Status  12/27/2021 29.6 (L) 32.0 - 36.0 g/dL Final   [facility]  Date Value Ref Range Status  12/27/2021 19.4 (L) 27.0 - 33.0 pg Final   MCV  Date Value Ref Range Status  12/27/2021 65.7 (L) 80.0 - 100.0 fL Final  01/30/2016 66 (L) 79 - 97 fL Final   No results found for: "PLTCOUNTKUC", "LABPLAT", "POCPLA" RDW  Date Value Ref Range Status  12/27/2021 16.9 (H) 11.0 - 15.0 % Final  01/30/2016 17.0 (H) 12.3 - 15.4 % Final

## 2022-08-21 ENCOUNTER — Telehealth: Payer: Self-pay | Admitting: Family Medicine

## 2022-08-21 NOTE — Telephone Encounter (Signed)
N/A unable to leave a message for patient to call back and schedule the Medicare Annual Wellness Visit (AWV) virtually or by telephone.  Last AWV 11/07/20  Please schedule at anytime with [place].  Any questions, please call me at [phone]

## 2022-10-04 ENCOUNTER — Telehealth: Payer: Self-pay | Admitting: Family Medicine

## 2022-10-04 NOTE — Telephone Encounter (Signed)
N/A unable to leave a message for patient to call back and schedule the Medicare Annual Wellness Visit (AWV) virtually or by telephone.  Last AWV 11/07/20  Please schedule at anytime with [facility] Advisor.   Any questions, please call me at [phone]

## 2022-11-25 ENCOUNTER — Encounter (INDEPENDENT_AMBULATORY_CARE_PROVIDER_SITE_OTHER): Payer: Medicare Other

## 2022-11-25 ENCOUNTER — Encounter: Payer: Self-pay | Admitting: Family Medicine

## 2022-11-25 VITALS — Ht <= 58 in | Wt 126.0 lb

## 2022-11-25 DIAGNOSIS — Z23 Encounter for immunization: Secondary | ICD-10-CM | POA: Diagnosis not present

## 2022-11-26 NOTE — Progress Notes (Signed)
A user error has taken place: encounter opened in error, closed for administrative reasons.

## 2023-04-11 ENCOUNTER — Ambulatory Visit (INDEPENDENT_AMBULATORY_CARE_PROVIDER_SITE_OTHER): Payer: Medicare Other

## 2023-04-11 VITALS — BP 132/84 | Ht 60.0 in | Wt 114.4 lb

## 2023-04-11 DIAGNOSIS — Z0000 Encounter for general adult medical examination without abnormal findings: Secondary | ICD-10-CM | POA: Diagnosis not present

## 2023-04-11 NOTE — Progress Notes (Signed)
Subjective:   Gabriela Robles is a 89 y.o. female who presents for Medicare Annual (Subsequent) preventive examination.  Review of Systems     Cardiac Risk Factors include: advanced age (>60men, >64 women);diabetes mellitus;hypertension;sedentary lifestyle     Objective:    Today's Vitals   04/11/23 0844  BP: 132/84  Weight: 114 lb 6.4 oz (51.9 kg)  Height: 5' (1.524 m)   Body mass index is 22.34 kg/m.     04/11/2023    8:53 AM 11/07/2020   10:25 AM 10/30/2020    1:00 AM 05/12/2020    9:48 AM 01/25/2020   11:13 AM 01/05/2020    7:39 AM 11/02/2019    9:31 AM  Advanced Directives  Does Patient Have a Medical Advance Directive? No Yes No No No Unable to assess, patient is non-responsive or altered mental status No  Type of Special educational needs teacher of [place];Living will       Copy of Healthcare Power of Attorney in Chart?  No - copy requested       Would patient like information on creating a medical advance directive? No - Patient declined  No - Patient declined;No - Guardian declined No - Patient declined       Current Medications (verified) Outpatient Encounter Medications as of 04/11/2023  Medication Sig   atorvastatin (LIPITOR) 20 MG tablet TAKE 1 TABLET BY MOUTH EVERY DAY   [facility] ASPIRIN ADULT LOW DOSE 81 MG chewable tablet Chew 81 mg by mouth daily.   gabapentin (NEURONTIN) 100 MG capsule Take 1 capsule (100 mg total) by mouth 2 (two) times daily.   lisinopril (ZESTRIL) 10 MG tablet TAKE 1 TABLET BY MOUTH EVERY DAY   metFORMIN (GLUCOPHAGE-XR) 500 MG 24 hr tablet TAKE 1 TABLET BY MOUTH EVERY DAY WITH BREAKFAST   MULTIPLE VITAMINS-MINERALS ER PO Take by mouth.   Olopatadine HCl 0.2 % SOLN Apply 1 drop to eye 2 (two) times daily as needed (allergic eye). Apply to both eyes   Omega-3 Fatty Acids (FISH OIL) 1000 MG CPDR Take by mouth.   traMADol (ULTRAM) 50 MG tablet Take 1-2 tablets (50-100 mg total) by mouth 2 (two) times daily as needed (Gout).   VITRON-C  65-125 MG TABS TAKE 1 TABLET BY MOUTH EVERY DAY   pantoprazole (PROTONIX) 40 MG tablet Take 1 tablet (40 mg total) by mouth 2 (two) times daily.   No facility-administered encounter medications on file as of 04/11/2023.    Allergies (verified) Patient has no known allergies.   History: Past Medical History:  Diagnosis Date   Acute ischemic left MCA stroke (HCC)    Arthritis    Diabetes mellitus without complication (HCC)    History of stroke 2017   Hyperlipidemia    Hypertension    Past Surgical History:  Procedure Laterality Date   LIVER SURGERY     per her son   Family History  Problem Relation Age of Onset   Cancer Daughter        bone cancer   Social History   Socioeconomic History   Marital status: Widowed    Spouse name: Not on file   Number of children: 10   Years of education: Not on file   Highest education level: Not on file  Occupational History   Occupation: retired  Tobacco Use   Smoking status: Never   Smokeless tobacco: Never  Vaping Use   Vaping Use: Never used  Substance and Sexual Activity   Alcohol use: No  Alcohol/week: 0.0 standard drinks of alcohol   Drug use: No   Sexual activity: Not Currently  Other Topics Concern   Not on file  Social History Narrative   5 children living and 5 children deceased.    Social Determinants of Health   Financial Resource Strain: Low Risk  (04/11/2023)   Overall Financial Resource Strain (CARDIA)    Difficulty of Paying Living Expenses: Not hard at all  Food Insecurity: No Food Insecurity (04/11/2023)   Hunger Vital Sign    Worried About Running Out of Food in the Last Year: Never true    Ran Out of Food in the Last Year: Never true  Transportation Needs: No Transportation Needs (04/11/2023)   PRAPARE - Administrator, Civil Service (Medical): No    Lack of Transportation (Non-Medical): No  Physical Activity: Inactive (04/11/2023)   Exercise Vital Sign    Days of Exercise per Week: 0  days    Minutes of Exercise per Session: 0 min  Stress: No Stress Concern Present (04/11/2023)   [employer] of Occupational Health - Occupational Stress Questionnaire    Feeling of Stress : Only a little  Social Connections: Socially Isolated (04/11/2023)   Social Connection and Isolation Panel [NHANES]    Frequency of Communication with Friends and Family: Never    Frequency of Social Gatherings with Friends and Family: Once a week    Attends Religious Services: Never    Database administrator or Organizations: No    Attends Banker Meetings: Never    Marital Status: Widowed    Tobacco Counseling Counseling given: Not Answered   Clinical Intake:  Pre-visit preparation completed: Yes  Pain : No/denies pain     Nutritional Risks: None Diabetes: Yes CBG done?: No Did pt. bring in CBG monitor from home?: No  How often do you need to have someone help you when you read instructions, pamphlets, or other written materials from your doctor or pharmacy?: 1 - Never  Diabetic?yes Nutrition Risk Assessment:  Has the patient had any N/V/D within the last 2 months?  No  Does the patient have any non-healing wounds?  No  Has the patient had any unintentional weight loss or weight gain?  No   Diabetes:  Is the patient diabetic?  Yes  If diabetic, was a CBG obtained today?  No  Did the patient bring in their glucometer from home?  No  How often do you monitor your CBG's? never.   Financial Strains and Diabetes Management:  Are you having any financial strains with the device, your supplies or your medication? No .  Does the patient want to be seen by Chronic Care Management for management of their diabetes?  No  Would the patient like to be referred to a Nutritionist or for Diabetic Management?  No   Diabetic Exams:  Diabetic Eye Exam: Completed no. Overdue for diabetic eye exam. Pt has been advised about the importance in completing this exam.   Diabetic  Foot Exam: Completed 12/27/21. Pt has been advised about the importance in completing this exam.    Interpreter Needed?: No  Information entered by :: Kennedy Bucker, LPN   Activities of Daily Living    04/11/2023    8:54 AM  In your present state of health, do you have any difficulty performing the following activities:  Hearing? 1  Vision? 0  Difficulty concentrating or making decisions? 0  Walking or climbing stairs? 1  Dressing or  bathing? 0  Doing errands, shopping? 1  Preparing Food and eating ? Y  Using the Toilet? N  In the past six months, have you accidently leaked urine? N  Do you have problems with loss of bowel control? N  Managing your Medications? Y  Managing your Finances? Y  Housekeeping or managing your Housekeeping? Y    Patient Care Team: Smitty Cords, DO as PCP - General (Family Medicine)  Indicate any recent Medical Services you may have received from other than [facility] providers in the past year (date may be approximate).     Assessment:   This is a routine wellness examination for Jann.  Hearing/Vision screen Hearing Screening - Comments:: Has hearing aid, doesn't help Vision Screening - Comments:: No glasses-Dr.Woodard  Dietary issues and exercise activities discussed: Current Exercise Habits: The patient does not participate in regular exercise at present   Goals Addressed             This Visit's Progress    DIET - EAT MORE FRUITS AND VEGETABLES         Depression Screen    04/11/2023    8:50 AM 01/11/2022   11:05 AM 12/27/2021   10:29 AM 11/07/2020   10:27 AM 05/02/2020   10:56 AM 01/12/2020   10:21 AM 11/02/2019    9:30 AM  PHQ 2/9 Scores  PHQ - 2 Score 2 0 0 0 0 0 0  PHQ- 9 Score  1 1        Fall Risk    04/11/2023    8:54 AM 01/11/2022   11:05 AM 12/27/2021   10:28 AM 11/07/2020   10:26 AM 05/02/2020   10:56 AM  Fall Risk   Falls in the past year? 0 0 0 0 0  Number falls in past yr: 0 0 0  0  Injury with Fall? 0 0 0  0   Risk for fall due to : No Fall Risks Impaired mobility No Fall Risks Medication side effect   Follow up Falls prevention discussed;Falls evaluation completed Falls evaluation completed Falls evaluation completed Falls evaluation completed;Education provided;Falls prevention discussed Falls evaluation completed    FALL RISK PREVENTION PERTAINING TO THE HOME:  Any stairs in or around the home? Yes  If so, are there any without handrails? Yes  Home free of loose throw rugs in walkways, pet beds, electrical cords, etc? Yes  Adequate lighting in your home to reduce risk of falls? Yes   ASSISTIVE DEVICES UTILIZED TO PREVENT FALLS:  Life alert? No  Use of a cane, walker or w/c? Yes - cane, walker Grab bars in the bathroom? Yes  Shower chair or bench in shower? Yes  Elevated toilet seat or a handicapped toilet? Yes   TIMED UP AND GO:  Was the test performed? Yes .  Length of time to ambulate 10 feet: 6 sec.   Gait slow and steady with assistive device  Cognitive Function:        04/11/2023    9:00 AM  6CIT Screen  What Year? 4 points  What month? 3 points  What time? 3 points  Count back from 20 4 points  Months in reverse 4 points  Repeat phrase 10 points  Total Score 28 points    Immunizations Immunization History  Administered Date(s) Administered   Fluad Quad(high Dose 65+) 09/29/2019, 11/25/2022   Influenza, High Dose Seasonal PF 07/24/2015, 08/18/2018   Influenza-Unspecified 08/22/2017   PFIZER Comirnaty(Gray Top)Covid-19 Tri-Sucrose Vaccine 01/14/2021  Pneumococcal Conjugate-13 10/06/2018   Pneumococcal Polysaccharide-23 11/02/2019    TDAP status: Due, Education has been provided regarding the importance of this vaccine. Advised may receive this vaccine at local pharmacy or Health Dept. Aware to provide a copy of the vaccination record if obtained from local pharmacy or Health Dept. Verbalized acceptance and understanding.  Flu Vaccine status: Up to  date  Pneumococcal vaccine status: Up to date  Covid-19 vaccine status: Completed vaccines  Qualifies for Shingles Vaccine? No   Zostavax completed No   Shingrix Completed?: No.    Education has been provided regarding the importance of this vaccine. Patient has been advised to call insurance company to determine out of pocket expense if they have not yet received this vaccine. Advised may also receive vaccine at local pharmacy or Health Dept. Verbalized acceptance and understanding.  Screening Tests Health Maintenance  Topic Date Due   OPHTHALMOLOGY EXAM  Never done   DTaP/Tdap/Td (1 - Tdap) Never done   Zoster Vaccines- Shingrix (1 of 2) Never done   DEXA SCAN  Never done   COVID-19 Vaccine (2 - 2023-24 season) 06/28/2022   HEMOGLOBIN A1C  06/29/2022   FOOT EXAM  12/28/2022   INFLUENZA VACCINE  05/29/2023   Medicare Annual Wellness (AWV)  04/10/2024   Pneumonia Vaccine 55+ Years old  Completed   HPV VACCINES  Aged Out    Health Maintenance  Health Maintenance Due  Topic Date Due   OPHTHALMOLOGY EXAM  Never done   DTaP/Tdap/Td (1 - Tdap) Never done   Zoster Vaccines- Shingrix (1 of 2) Never done   DEXA SCAN  Never done   COVID-19 Vaccine (2 - 2023-24 season) 06/28/2022   HEMOGLOBIN A1C  06/29/2022   FOOT EXAM  12/28/2022    Colorectal cancer screening: No longer required.   Mammogram status: No longer required due to age.   Lung Cancer Screening: (Low Dose CT Chest recommended if Age 57-80 years, 30 pack-year currently smoking OR have quit w/in 15years.) does not qualify.   Additional Screening:  Hepatitis C Screening: does not qualify; Completed no  Vision Screening: Recommended annual ophthalmology exams for early detection of glaucoma and other disorders of the eye. Is the patient up to date with their annual eye exam?  Yes  Who is the provider or what is the name of the office in which the patient attends annual eye exams? Dr.Woodard If pt is not established  with a provider, would they like to be referred to a provider to establish care? No .   Dental Screening: Recommended annual dental exams for proper oral hygiene  Community Resource Referral / Chronic Care Management: CRR required this visit?  No   CCM required this visit?  No      Plan:     I have personally reviewed and noted the following in the patient's chart:   Medical and social history Use of alcohol, tobacco or illicit drugs  Current medications and supplements including opioid prescriptions. Patient is not currently taking opioid prescriptions. Functional ability and status Nutritional status Physical activity Advanced directives List of other physicians Hospitalizations, surgeries, and ER visits in previous 12 months Vitals Screenings to include cognitive, depression, and falls Referrals and appointments  In addition, I have reviewed and discussed with patient certain preventive protocols, quality metrics, and best practice recommendations. A written personalized care plan for preventive services as well as general preventive health recommendations were provided to patient.     Hal Hope, LPN  04/11/2023   Nurse Notes: none

## 2023-04-11 NOTE — Patient Instructions (Signed)
Gabriela Robles , Thank you for taking time to come for your Medicare Wellness Visit. I appreciate your ongoing commitment to your health goals. Please review the following plan we discussed and let me know if I can assist you in the future.   These are the goals we discussed:  Goals      DIET - EAT MORE FRUITS AND VEGETABLES     DIET - INCREASE WATER INTAKE     Recommend drinking at least 6-8 glasses of water a day      Patient Stated     11/07/2020, no goals        This is a list of the screening recommended for you and due dates:  Health Maintenance  Topic Date Due   Eye exam for diabetics  Never done   DTaP/Tdap/Td vaccine (1 - Tdap) Never done   Zoster (Shingles) Vaccine (1 of 2) Never done   DEXA scan (bone density measurement)  Never done   COVID-19 Vaccine (2 - 2023-24 season) 06/28/2022   Hemoglobin A1C  06/29/2022   Complete foot exam   12/28/2022   Flu Shot  05/29/2023   Medicare Annual Wellness Visit  04/10/2024   Pneumonia Vaccine  Completed   HPV Vaccine  Aged Out    Advanced directives: no  Conditions/risks identified: none  Next appointment: Follow up in one year for your annual wellness visit 04/16/24 @ 8:15 am in person   Preventive Care 65 Years and Older, Female Preventive care refers to lifestyle choices and visits with your health care provider that can promote health and wellness. What does preventive care include? A yearly physical exam. This is also called an annual well check. Dental exams once or twice a year. Routine eye exams. Ask your health care provider how often you should have your eyes checked. Personal lifestyle choices, including: Daily care of your teeth and gums. Regular physical activity. Eating a healthy diet. Avoiding tobacco and drug use. Limiting alcohol use. Practicing safe sex. Taking low-dose aspirin every day. Taking vitamin and mineral supplements as recommended by your health care provider. What happens during an  annual well check? The services and screenings done by your health care provider during your annual well check will depend on your age, overall health, lifestyle risk factors, and family history of disease. Counseling  Your health care provider may ask you questions about your: Alcohol use. Tobacco use. Drug use. Emotional well-being. Home and relationship well-being. Sexual activity. Eating habits. History of falls. Memory and ability to understand (cognition). Work and work Astronomer. Reproductive health. Screening  You may have the following tests or measurements: Height, weight, and BMI. Blood pressure. Lipid and cholesterol levels. These may be checked every 5 years, or more frequently if you are over 61 years old. Skin check. Lung cancer screening. You may have this screening every year starting at age 73 if you have a 30-pack-year history of smoking and currently smoke or have quit within the past 15 years. Fecal occult blood test (FOBT) of the stool. You may have this test every year starting at age 20. Flexible sigmoidoscopy or colonoscopy. You may have a sigmoidoscopy every 5 years or a colonoscopy every 10 years starting at age 44. Hepatitis C blood test. Hepatitis B blood test. Sexually transmitted disease (STD) testing. Diabetes screening. This is done by checking your blood sugar (glucose) after you have not eaten for a while (fasting). You may have this done every 1-3 years. Bone density scan. This is  done to screen for osteoporosis. You may have this done starting at age 54. Mammogram. This may be done every 1-2 years. Talk to your health care provider about how often you should have regular mammograms. Talk with your health care provider about your test results, treatment options, and if necessary, the need for more tests. Vaccines  Your health care provider may recommend certain vaccines, such as: Influenza vaccine. This is recommended every year. Tetanus,  diphtheria, and acellular pertussis (Tdap, Td) vaccine. You may need a Td booster every 10 years. Zoster vaccine. You may need this after age 35. Pneumococcal 13-valent conjugate (PCV13) vaccine. One dose is recommended after age 36. Pneumococcal polysaccharide (PPSV23) vaccine. One dose is recommended after age 55. Talk to your health care provider about which screenings and vaccines you need and how often you need them. This information is not intended to replace advice given to you by your health care provider. Make sure you discuss any questions you have with your health care provider. Document Released: 11/10/2015 Document Revised: 07/03/2016 Document Reviewed: 08/15/2015 Elsevier Interactive Patient Education  2017 [employer] Prevention in the Home Falls can cause injuries. They can happen to people of all ages. There are many things you can do to make your home safe and to help prevent falls. What can I do on the outside of my home? Regularly fix the edges of walkways and driveways and fix any cracks. Remove anything that might make you trip as you walk through a door, such as a raised step or threshold. Trim any bushes or trees on the path to your home. Use bright outdoor lighting. Clear any walking paths of anything that might make someone trip, such as rocks or tools. Regularly check to see if handrails are loose or broken. Make sure that both sides of any steps have handrails. Any raised decks and porches should have guardrails on the edges. Have any leaves, snow, or ice cleared regularly. Use sand or salt on walking paths during winter. Clean up any spills in your garage right away. This includes oil or grease spills. What can I do in the bathroom? Use night lights. Install grab bars by the toilet and in the tub and shower. Do not use towel bars as grab bars. Use non-skid mats or decals in the tub or shower. If you need to sit down in the shower, use a plastic,  non-slip stool. Keep the floor dry. Clean up any water that spills on the floor as soon as it happens. Remove soap buildup in the tub or shower regularly. Attach bath mats securely with double-sided non-slip rug tape. Do not have throw rugs and other things on the floor that can make you trip. What can I do in the bedroom? Use night lights. Make sure that you have a light by your bed that is easy to reach. Do not use any sheets or blankets that are too big for your bed. They should not hang down onto the floor. Have a firm chair that has side arms. You can use this for support while you get dressed. Do not have throw rugs and other things on the floor that can make you trip. What can I do in the kitchen? Clean up any spills right away. Avoid walking on wet floors. Keep items that you use a lot in easy-to-reach places. If you need to reach something above you, use a strong step stool that has a grab bar. Keep electrical cords out of  the way. Do not use floor polish or wax that makes floors slippery. If you must use wax, use non-skid floor wax. Do not have throw rugs and other things on the floor that can make you trip. What can I do with my stairs? Do not leave any items on the stairs. Make sure that there are handrails on both sides of the stairs and use them. Fix handrails that are broken or loose. Make sure that handrails are as long as the stairways. Check any carpeting to make sure that it is firmly attached to the stairs. Fix any carpet that is loose or worn. Avoid having throw rugs at the top or bottom of the stairs. If you do have throw rugs, attach them to the floor with carpet tape. Make sure that you have a light switch at the top of the stairs and the bottom of the stairs. If you do not have them, ask someone to add them for you. What else can I do to help prevent falls? Wear shoes that: Do not have high heels. Have rubber bottoms. Are comfortable and fit you well. Are closed  at the toe. Do not wear sandals. If you use a stepladder: Make sure that it is fully opened. Do not climb a closed stepladder. Make sure that both sides of the stepladder are locked into place. Ask someone to hold it for you, if possible. Clearly mark and make sure that you can see: Any grab bars or handrails. First and last steps. Where the edge of each step is. Use tools that help you move around (mobility aids) if they are needed. These include: Canes. Walkers. Scooters. Crutches. Turn on the lights when you go into a dark area. Replace any light bulbs as soon as they burn out. Set up your furniture so you have a clear path. Avoid moving your furniture around. If any of your floors are uneven, fix them. If there are any pets around you, be aware of where they are. Review your medicines with your doctor. Some medicines can make you feel dizzy. This can increase your chance of falling. Ask your doctor what other things that you can do to help prevent falls. This information is not intended to replace advice given to you by your health care provider. Make sure you discuss any questions you have with your health care provider. Document Released: 08/10/2009 Document Revised: 03/21/2016 Document Reviewed: 11/18/2014 Elsevier Interactive Patient Education  2017 [employer].

## 2023-04-17 ENCOUNTER — Encounter: Payer: Self-pay | Admitting: Family Medicine

## 2023-04-17 ENCOUNTER — Ambulatory Visit (INDEPENDENT_AMBULATORY_CARE_PROVIDER_SITE_OTHER): Payer: Medicare Other | Admitting: Family Medicine

## 2023-04-17 VITALS — BP 138/82 | HR 58 | Ht 62.0 in | Wt 108.0 lb

## 2023-04-17 DIAGNOSIS — M1A09X0 Idiopathic chronic gout, multiple sites, without tophus (tophi): Secondary | ICD-10-CM

## 2023-04-17 DIAGNOSIS — I700 Atherosclerosis of aorta: Secondary | ICD-10-CM | POA: Diagnosis not present

## 2023-04-17 DIAGNOSIS — I6930 Unspecified sequelae of cerebral infarction: Secondary | ICD-10-CM | POA: Diagnosis not present

## 2023-04-17 DIAGNOSIS — E1142 Type 2 diabetes mellitus with diabetic polyneuropathy: Secondary | ICD-10-CM

## 2023-04-17 DIAGNOSIS — F331 Major depressive disorder, recurrent, moderate: Secondary | ICD-10-CM | POA: Diagnosis not present

## 2023-04-17 DIAGNOSIS — I10 Essential (primary) hypertension: Secondary | ICD-10-CM

## 2023-04-17 DIAGNOSIS — E782 Mixed hyperlipidemia: Secondary | ICD-10-CM | POA: Diagnosis not present

## 2023-04-17 DIAGNOSIS — E1140 Type 2 diabetes mellitus with diabetic neuropathy, unspecified: Secondary | ICD-10-CM | POA: Diagnosis not present

## 2023-04-17 DIAGNOSIS — G720 Drug-induced myopathy: Secondary | ICD-10-CM | POA: Diagnosis not present

## 2023-04-17 LAB — POCT GLYCOSYLATED HEMOGLOBIN (HGB A1C): Hemoglobin A1C: 8.4 % — AB (ref 4.0–5.6)

## 2023-04-17 MED ORDER — TRAMADOL HCL 50 MG PO TABS: 50.0000 mg | ORAL_TABLET | Freq: Two times a day (BID) | ORAL | 2 refills | Status: DC | PRN

## 2023-04-17 MED ORDER — METFORMIN HCL ER 500 MG PO TB24: ORAL_TABLET | ORAL | 3 refills | Status: DC

## 2023-04-17 MED ORDER — ATORVASTATIN CALCIUM 20 MG PO TABS: 20.0000 mg | ORAL_TABLET | Freq: Every day | ORAL | 3 refills | Status: DC

## 2023-04-17 MED ORDER — GABAPENTIN 100 MG PO CAPS: 100.0000 mg | ORAL_CAPSULE | Freq: Two times a day (BID) | ORAL | 3 refills | Status: DC

## 2023-04-17 MED ORDER — SERTRALINE HCL 50 MG PO TABS: 50.0000 mg | ORAL_TABLET | Freq: Every day | ORAL | 1 refills | Status: DC

## 2023-04-17 MED ORDER — LISINOPRIL 10 MG PO TABS: 10.0000 mg | ORAL_TABLET | Freq: Every day | ORAL | 3 refills | Status: DC

## 2023-04-17 NOTE — Progress Notes (Signed)
Subjective:    Patient ID: Gabriela Robles, female    DOB: Jun 24, 1929, 89 y.o.   MRN: [id number]  Gabriela Robles is a 89 y.o. female presenting on 04/17/2023 for Annual Exam   HPI  Right Knee Pain chronic bulky joint with some swelling History of arthritis / gout Improved w/ massage Use cane Topical relief Pain episodic flares limiting mobility   Hearing Loss, Bilateral Recent audiology, has new hearing aid   CHRONIC DM, Type 2 - Diet Controlled / DM Neuropathy Doing well CBGs: No longer checking CBG. Meds: metformin XR 500 BID - miss PM dose sometimes. Currently on ACEi Diet is balanced, some sweets still - appetite is good overall. Denies hypoglycemia   Atherosclerosis Aorta Seen on CT image 10/2020. On Statin therapy No concerns or complaints  Major Depression recurrent vs Cognitive decline Eating goes through cycles with appetite and preferences Crying spells episodic Lives with her daughter and it causes difficulty with caregiver support     04/17/2023   10:28 AM 04/11/2023    8:50 AM 01/11/2022   11:05 AM  Depression screen PHQ 2/9  Decreased Interest 1 1 0  Down, Depressed, Hopeless 2 1 0  PHQ - 2 Score 3 2 0  Altered sleeping 1  0  Tired, decreased energy 3 1 1   Change in appetite 2 1 0  Feeling bad or failure about yourself  1 1 0  Trouble concentrating 3  0  Moving slowly or fidgety/restless 3  0  Suicidal thoughts 2  0  PHQ-9 Score 18  1  Difficult doing work/chores Very difficult Somewhat difficult Not difficult at all      04/17/2023   10:29 AM 01/11/2022   11:05 AM 12/27/2021   10:29 AM  GAD 7 : Generalized Anxiety Score  Nervous, Anxious, on Edge 1 1 1   Control/stop worrying 1 1 1   Worry too much - different things 0 1 1  Trouble relaxing 2  0  Restless 0 0 0  Easily annoyed or irritable 1 0 0  Afraid - awful might happen 0 1 1  Total GAD 7 Score 5  4  Anxiety Difficulty Somewhat difficult Not difficult at all Not difficult at all       Social History   Tobacco Use   Smoking status: Never   Smokeless tobacco: Never  Vaping Use   Vaping Use: Never used  Substance Use Topics   Alcohol use: No    Alcohol/week: 0.0 standard drinks of alcohol   Drug use: No    Review of Systems  Constitutional:  Negative for activity change, appetite change, chills, diaphoresis, fatigue and fever.  HENT:  Negative for congestion and hearing loss.   Eyes:  Negative for visual disturbance.  Respiratory:  Negative for cough, chest tightness, shortness of breath and wheezing.   Cardiovascular:  Negative for chest pain, palpitations and leg swelling.  Gastrointestinal:  Negative for abdominal pain, constipation, diarrhea, nausea and vomiting.  Genitourinary:  Negative for dysuria, frequency and hematuria.  Musculoskeletal:  Negative for arthralgias and neck pain.  Skin:  Negative for rash.  Neurological:  Negative for dizziness, weakness, light-headedness, numbness and headaches.  Hematological:  Negative for adenopathy.  Psychiatric/Behavioral:  Negative for behavioral problems, dysphoric mood and sleep disturbance.    Per HPI unless specifically indicated above     Objective:    BP 138/82 (BP Location: Left Arm, Cuff Size: Normal)   Pulse (!) 58   Ht 5\' 2"  (1.575 m)  Wt 108 lb (49 kg)   SpO2 97%   BMI 19.75 kg/m   Wt Readings from Last 3 Encounters:  04/17/23 108 lb (49 kg)  04/11/23 114 lb 6.4 oz (51.9 kg)  11/25/22 126 lb (57.2 kg)    Physical Exam Vitals and nursing note reviewed.  Constitutional:      General: She is not in acute distress.    Appearance: She is well-developed. She is not diaphoretic.     Comments: Well-appearing elderly female comfortable, cooperative  HENT:     Head: Normocephalic and atraumatic.     Comments: hard of hearing Eyes:     General:        Right eye: No discharge.        Left eye: No discharge.     Conjunctiva/sclera: Conjunctivae normal.  Neck:     Thyroid: No  thyromegaly.  Cardiovascular:     Rate and Rhythm: Normal rate and regular rhythm.     Heart sounds: Normal heart sounds. No murmur heard. Pulmonary:     Effort: Pulmonary effort is normal. No respiratory distress.     Breath sounds: Normal breath sounds. No wheezing or rales.  Musculoskeletal:     Cervical back: Normal range of motion and neck supple.     Right lower leg: No edema.     Left lower leg: No edema.     Comments: R Knee bulky knee w/ crepitus reduced ROM  Lymphadenopathy:     Cervical: No cervical adenopathy.  Skin:    General: Skin is warm and dry.     Findings: No erythema or rash.  Neurological:     Mental Status: She is alert and oriented to person, place, and time.  Psychiatric:        Behavior: Behavior normal.     Comments: Well groomed, good eye contact, normal speech and thoughts per daughter, interpreting      Results for orders placed or performed in visit on 04/17/23  POCT HgB A1C  Result Value Ref Range   Hemoglobin A1C 8.4 (A) 4.0 - 5.6 %   HbA1c POC (<> result, manual entry)     HbA1c, POC (prediabetic range)     HbA1c, POC (controlled diabetic range)        Assessment & Plan:   Problem List Items Addressed This Visit     Atherosclerosis of aorta (HCC)   Relevant Medications   atorvastatin (LIPITOR) 20 MG tablet   lisinopril (ZESTRIL) 10 MG tablet   Chronic gout of multiple sites   Relevant Medications   traMADol (ULTRAM) 50 MG tablet   Other Relevant Orders   AMB Referral to Chronic Care Management Services   AMB Referral to Chronic Care Management Services   Diabetic peripheral neuropathy (HCC)   Relevant Medications   sertraline (ZOLOFT) 50 MG tablet   atorvastatin (LIPITOR) 20 MG tablet   gabapentin (NEURONTIN) 100 MG capsule   lisinopril (ZESTRIL) 10 MG tablet   metFORMIN (GLUCOPHAGE-XR) 500 MG 24 hr tablet   Drug-induced myopathy   Essential (primary) hypertension   Relevant Medications   atorvastatin (LIPITOR) 20 MG tablet    gabapentin (NEURONTIN) 100 MG capsule   lisinopril (ZESTRIL) 10 MG tablet   Other Relevant Orders   AMB Referral to Chronic Care Management Services   History of cerebrovascular accident (CVA) with residual deficit   Relevant Orders   AMB Referral to Chronic Care Management Services   HLD (hyperlipidemia)   Relevant Medications   atorvastatin (LIPITOR)  20 MG tablet   lisinopril (ZESTRIL) 10 MG tablet   Moderate episode of recurrent major depressive disorder (HCC)   Relevant Medications   sertraline (ZOLOFT) 50 MG tablet   Other Relevant Orders   AMB Referral to Chronic Care Management Services   Other Visit Diagnoses     Controlled type 2 diabetes mellitus with diabetic neuropathy, without long-term current use of insulin (HCC)    -  Primary   Relevant Medications   atorvastatin (LIPITOR) 20 MG tablet   lisinopril (ZESTRIL) 10 MG tablet   metFORMIN (GLUCOPHAGE-XR) 500 MG 24 hr tablet   Other Relevant Orders   POCT HgB A1C (Completed)   AMB Referral to Chronic Care Management Services       Referral to Chronic Care Management Team - they will call you to discuss options for caregiver support in the home and other resources.  Cncern with patient having decline with mood depression, pain, cognitive decline and feels like burden. Family needs caregiver support to help manage her at home   Start new anti depression medication Sertraline 50mg  daily - this is to help reduce crying spells, improve mood  Knee pain, chronic, arthritis / gout START anti inflammatory topical - OTC Voltaren (generic Diclofenac) topical 2-4 times a day as needed for pain swelling of affected joint for 1-2 weeks or longer.  Use the pain medicine for knee and shoulder as needed. Tramadol, PRN   Meds ordered this encounter  Medications   sertraline (ZOLOFT) 50 MG tablet    Sig: Take 1 tablet (50 mg total) by mouth daily.    Dispense:  90 tablet    Refill:  1   atorvastatin (LIPITOR) 20 MG tablet     Sig: Take 1 tablet (20 mg total) by mouth daily.    Dispense:  90 tablet    Refill:  3   gabapentin (NEURONTIN) 100 MG capsule    Sig: Take 1 capsule (100 mg total) by mouth 2 (two) times daily.    Dispense:  180 capsule    Refill:  3   lisinopril (ZESTRIL) 10 MG tablet    Sig: Take 1 tablet (10 mg total) by mouth daily.    Dispense:  90 tablet    Refill:  3   metFORMIN (GLUCOPHAGE-XR) 500 MG 24 hr tablet    Sig: TAKE 1 TABLET BY MOUTH EVERY DAY WITH BREAKFAST    Dispense:  90 tablet    Refill:  3   traMADol (ULTRAM) 50 MG tablet    Sig: Take 1-2 tablets (50-100 mg total) by mouth 2 (two) times daily as needed (Gout).    Dispense:  20 tablet    Refill:  2      Follow up plan: Return in about 3 months (around 07/18/2023) for 3 month follow-up Depression, med updates.   Saralyn Pilar, DO [facility] 04/17/2023, 10:26 AM

## 2023-04-17 NOTE — Patient Instructions (Addendum)
Thank you for coming to the office today.  Referral to Chronic Care Management Team - they will call you to discuss options for caregiver support in the home and other resources.  Start new anti depression medication  Sertraline 50mg  daily - this is to help reduce crying spells, improve mood  START anti inflammatory topical - OTC Voltaren (generic Diclofenac) topical 2-4 times a day as needed for pain swelling of affected joint for 1-2 weeks or longer.  Use the pain medicine for knee and shoulder as needed.  Please schedule a Follow-up Appointment to: Return in about 3 months (around 07/18/2023) for 3 month follow-up Depression, med updates.  If you have any other questions or concerns, please feel free to call the office or send a message through MyChart. You may also schedule an earlier appointment if necessary.  Additionally, you may be receiving a survey about your experience at our office within a few days to 1 week by e-mail or mail. We value your feedback.  Saralyn Pilar, DO [facility], [place]

## 2023-04-18 ENCOUNTER — Telehealth: Payer: Self-pay

## 2023-04-18 NOTE — Progress Notes (Signed)
  Chronic Care Management   Note  04/18/2023 Name: Zhara Gieske MRN: [id number] DOB: 1929-01-05  Derra Shartzer is a 89 y.o. year old female who is a primary care patient of Smitty Cords, DO. I reached out to [employer] by phone today in response to a referral sent by Ms. Melyssa Royall's PCP.  The first contact attempt was unsuccessful.   Follow up plan: Additional outreach attempts will be made.  Penne Lash, RMA Care Guide [facility]  [place], [place] [address] Direct Dial: [phone] [email or web address]

## 2023-05-06 NOTE — Progress Notes (Signed)
  Chronic Care Management   Note  05/06/2023 Name: Jelyssa Baile MRN: [id number] DOB: [birth year]  Mikaelyn Song is a 89 y.o. year old female who is a primary care patient of Smitty Cords, DO. I reached out to [employer] by phone today in response to a referral sent by Ms. Nhyla Zeman's PCP.  The second contact attempt was unsuccessful.   Follow up plan: Additional outreach attempts will be made.  Penne Lash, RMA Care Guide [facility]  [place], [place] [address] Direct Dial: [phone] [email or web address]

## 2023-05-27 NOTE — Progress Notes (Signed)
  Care Management   Note  05/27/2023 Name: Yitta Zenner MRN: [id number] DOB: February 08, 1929  Rhiannon Brien is a 89 y.o. year old female who is a primary care patient of Smitty Cords, DO. I reached out to [employer] by phone today offer care management services.   Ms. Zwieg was given information about care management services today including:  Care management services include personalized support from designated clinical staff, including individualized plan of care and coordination with other care providers 24/7 contact phone numbers for assistance for urgent and routine care needs. The patient may stop care management services at any time by phone call to the office staff.  Patient did not agree to enrollment in care management services and does not wish to consider at this time.  Follow up plan: The patient has been provided with contact information for the care management team and has been advised to call with any health related questions or concerns.   Penne Lash, RMA Care Guide [facility]  [place], [place] [address] Direct Dial: [phone] [email or web address]

## 2023-06-19 DIAGNOSIS — H40012 Open angle with borderline findings, low risk, left eye: Secondary | ICD-10-CM | POA: Diagnosis not present

## 2023-06-19 DIAGNOSIS — H1045 Other chronic allergic conjunctivitis: Secondary | ICD-10-CM | POA: Diagnosis not present

## 2023-06-19 DIAGNOSIS — H26493 Other secondary cataract, bilateral: Secondary | ICD-10-CM | POA: Diagnosis not present

## 2023-06-19 DIAGNOSIS — E119 Type 2 diabetes mellitus without complications: Secondary | ICD-10-CM | POA: Diagnosis not present

## 2023-06-19 DIAGNOSIS — Z961 Presence of intraocular lens: Secondary | ICD-10-CM | POA: Diagnosis not present

## 2023-06-19 LAB — HM DIABETES EYE EXAM

## 2023-07-21 ENCOUNTER — Other Ambulatory Visit: Payer: Medicare Other

## 2023-07-21 ENCOUNTER — Other Ambulatory Visit: Payer: Self-pay | Admitting: Family Medicine

## 2023-07-21 ENCOUNTER — Ambulatory Visit (INDEPENDENT_AMBULATORY_CARE_PROVIDER_SITE_OTHER): Payer: Medicare Other | Admitting: Family Medicine

## 2023-07-21 ENCOUNTER — Encounter: Payer: Self-pay | Admitting: Family Medicine

## 2023-07-21 VITALS — BP 148/94 | HR 80 | Ht 62.0 in | Wt 110.0 lb

## 2023-07-21 DIAGNOSIS — F331 Major depressive disorder, recurrent, moderate: Secondary | ICD-10-CM | POA: Diagnosis not present

## 2023-07-21 DIAGNOSIS — Z23 Encounter for immunization: Secondary | ICD-10-CM

## 2023-07-21 DIAGNOSIS — I10 Essential (primary) hypertension: Secondary | ICD-10-CM | POA: Diagnosis not present

## 2023-07-21 DIAGNOSIS — E1140 Type 2 diabetes mellitus with diabetic neuropathy, unspecified: Secondary | ICD-10-CM

## 2023-07-21 DIAGNOSIS — I6930 Unspecified sequelae of cerebral infarction: Secondary | ICD-10-CM

## 2023-07-21 DIAGNOSIS — E782 Mixed hyperlipidemia: Secondary | ICD-10-CM

## 2023-07-21 DIAGNOSIS — M1A09X0 Idiopathic chronic gout, multiple sites, without tophus (tophi): Secondary | ICD-10-CM

## 2023-07-21 NOTE — Patient Instructions (Addendum)
Thank you for coming to the office today.  Restart medications  Primarily need to restart  Lisinopril 10mg  daily blood pressure pill  Atorvastatin cholesterol medication.  Metformin continue this.  If still struggling with mood at next visit we consider another type of medication.   Please schedule a Follow-up Appointment to: Return in about 6 months (around 01/18/2024) for 6 month fasting lab only then 1 week later Follow-up DM, Mood, HTN updates.  If you have any other questions or concerns, please feel free to call the office or send a message through MyChart. You may also schedule an earlier appointment if necessary.  Additionally, you may be receiving a survey about your experience at our office within a few days to 1 week by e-mail or mail. We value your feedback.  Saralyn Pilar, DO [facility], [place]

## 2023-07-21 NOTE — Progress Notes (Signed)
Subjective:    Patient ID: Gabriela Robles, female    DOB: 11/02/1928, 89 y.o.   MRN: [id number]  Gabriela Robles is a 89 y.o. female presenting on 07/21/2023 for Depression and medication check (Pt refused to take medicaiotn after last visit, states they made her throw up, pt is taking that metformin )  History provided by patient and daughter.  HPI  Discussed the use of AI scribe software for clinical note transcription with the patient, who gave verbal consent to proceed.  History of Present Illness    The patient, with a history of diabetes, hypertension, and hyperlipidemia, has been non-compliant with her prescribed medications. She reported discontinuing all medications, except for metformin, due to adverse effects. Specifically, the new medication Sertraline prescribed during her last visit caused nausea, vomiting, and sleep disturbances, leading to its discontinuation.  The patient's appetite remains good, although she has been expressing dissatisfaction with the food provided, She has had problem with her taste and food has not tasted good in past. The patient has been refusing to take her prescribed medications, including those for cholesterol and blood pressure, which were previously well-tolerated.  Appetite is good Sleeping well Mood is down and crying spells  Right Knee Pain chronic bulky joint with some swelling History of arthritis / gout Improved w/ massage Use cane Topical relief Pain episodic flares limiting mobility   Hearing Loss, Bilateral Recent audiology, has new hearing aid   CHRONIC DM, Type 2 - Diet Controlled / DM Neuropathy Doing well CBGs: No longer checking CBG. Meds: metformin XR 500 BID Currently on ACEi Diet is balanced, some sweets still - appetite is good overall. Denies hypoglycemia   Atherosclerosis Aorta Seen on CT image 10/2020. On Statin therapy No concerns or complaints   Major Depression recurrent vs Cognitive  decline Eating goes through cycles with appetite and preferences Crying spells episodic Lives with her daughter and it causes difficulty with caregiver support  HM Flu Shot today     07/21/2023   11:49 AM 04/17/2023   10:28 AM 04/11/2023    8:50 AM  Depression screen PHQ 2/9  Decreased Interest 1 1 1   Down, Depressed, Hopeless 1 2 1   PHQ - 2 Score 2 3 2   Altered sleeping 0 1   Tired, decreased energy 1 3 1   Change in appetite 1 2 1   Feeling bad or failure about yourself  2 1 1   Trouble concentrating 0 3   Moving slowly or fidgety/restless 3 3   Suicidal thoughts 1 2   PHQ-9 Score 10 18   Difficult doing work/chores Very difficult Very difficult Somewhat difficult    Social History   Tobacco Use   Smoking status: Never   Smokeless tobacco: Never  Vaping Use   Vaping status: Never Used  Substance Use Topics   Alcohol use: No    Alcohol/week: 0.0 standard drinks of alcohol   Drug use: No    Review of Systems Per HPI unless specifically indicated above     Objective:    BP (!) 148/94 (BP Location: Left Arm, Cuff Size: Normal)   Pulse 80   Ht 5\' 2"  (1.575 m)   Wt 110 lb (49.9 kg)   BMI 20.12 kg/m   Wt Readings from Last 3 Encounters:  07/21/23 110 lb (49.9 kg)  04/17/23 108 lb (49 kg)  04/11/23 114 lb 6.4 oz (51.9 kg)    Physical Exam Vitals and nursing note reviewed.  Constitutional:  General: She is not in acute distress.    Appearance: Normal appearance. She is well-developed. She is not diaphoretic.     Comments: Well-appearing, comfortable, cooperative  HENT:     Head: Normocephalic and atraumatic.  Eyes:     General:        Right eye: No discharge.        Left eye: No discharge.     Conjunctiva/sclera: Conjunctivae normal.  Cardiovascular:     Rate and Rhythm: Normal rate.  Pulmonary:     Effort: Pulmonary effort is normal.  Skin:    General: Skin is warm and dry.     Findings: No erythema or rash.  Neurological:     Mental Status: She  is alert and oriented to person, place, and time.  Psychiatric:        Mood and Affect: Mood normal.        Behavior: Behavior normal.        Thought Content: Thought content normal.     Comments: Well groomed, good eye contact, normal speech and thoughts      Results for orders placed or performed in visit on 04/17/23  POCT HgB A1C  Result Value Ref Range   Hemoglobin A1C 8.4 (A) 4.0 - 5.6 %   HbA1c POC (<> result, manual entry)     HbA1c, POC (prediabetic range)     HbA1c, POC (controlled diabetic range)        Assessment & Plan:   Problem List Items Addressed This Visit     Essential (primary) hypertension   History of cerebrovascular accident (CVA) with residual deficit   Moderate episode of recurrent major depressive disorder (HCC)   Other Visit Diagnoses     Controlled type 2 diabetes mellitus with diabetic neuropathy, without long-term current use of insulin (HCC)    -  Primary   Need for influenza vaccination       Relevant Orders   Flu Vaccine Trivalent High Dose (Fluad) (Completed)       Assessment and Plan  Type 2 Diabetes Previously controlled Keep on Metformin  HYPERTENSION Elevated BP off medicine Restart therapy    Noncompliance with Medications Patient stopped taking all medications except Metformin due to gastrointestinal side effects. Discussed the importance of consistent medication use for managing chronic conditions. Attributed to GI disturbance on new SSRI Sertraline. We discussed that it was a known side effect and her other medicines should be unaffected -Gradually reintroduce Lisinopril 10mg  daily for hypertension and Atorvastatin 20mg  daily for hyperlipidemia. -Continue Metformin for diabetes management.  Mood Disorder vs cognitive decline. Patient experiencing mood disturbances, but previous medication caused gastrointestinal upset. Sleep is adequate. -Consider new mood stabilizer at next visit if mood issues persist. But caution with  side effects Currently sleeping well, will avoid new medication by request of patient and family at this time.  General Health Maintenance -Administered influenza vaccine today. -Schedule blood work for six months from now       No orders of the defined types were placed in this encounter.    Follow up plan: Return in about 6 months (around 01/18/2024) for 6 month fasting lab only then 1 week later Follow-up DM, Mood, HTN updates.  Future labs  Saralyn Pilar, DO [facility] 07/21/2023, 11:50 AM

## 2023-12-22 DIAGNOSIS — H26493 Other secondary cataract, bilateral: Secondary | ICD-10-CM | POA: Diagnosis not present

## 2023-12-22 DIAGNOSIS — H40012 Open angle with borderline findings, low risk, left eye: Secondary | ICD-10-CM | POA: Diagnosis not present

## 2023-12-22 DIAGNOSIS — Z961 Presence of intraocular lens: Secondary | ICD-10-CM | POA: Diagnosis not present

## 2023-12-22 DIAGNOSIS — E119 Type 2 diabetes mellitus without complications: Secondary | ICD-10-CM | POA: Diagnosis not present

## 2023-12-22 DIAGNOSIS — H1045 Other chronic allergic conjunctivitis: Secondary | ICD-10-CM | POA: Diagnosis not present

## 2024-01-12 ENCOUNTER — Other Ambulatory Visit: Payer: Medicare Other

## 2024-01-12 DIAGNOSIS — I10 Essential (primary) hypertension: Secondary | ICD-10-CM | POA: Diagnosis not present

## 2024-01-12 DIAGNOSIS — M1A09X0 Idiopathic chronic gout, multiple sites, without tophus (tophi): Secondary | ICD-10-CM | POA: Diagnosis not present

## 2024-01-12 DIAGNOSIS — E1140 Type 2 diabetes mellitus with diabetic neuropathy, unspecified: Secondary | ICD-10-CM

## 2024-01-12 DIAGNOSIS — E782 Mixed hyperlipidemia: Secondary | ICD-10-CM | POA: Diagnosis not present

## 2024-01-13 LAB — CBC WITH DIFFERENTIAL/PLATELET
Absolute Lymphocytes: 2825 {cells}/uL (ref 850–3900)
Absolute Monocytes: 382 {cells}/uL (ref 200–950)
Basophils Absolute: 21 {cells}/uL (ref 0–200)
Basophils Relative: 0.4 %
Eosinophils Absolute: 69 {cells}/uL (ref 15–500)
Eosinophils Relative: 1.3 %
HCT: 34.5 % — ABNORMAL LOW (ref 35.0–45.0)
Hemoglobin: 10.3 g/dL — ABNORMAL LOW (ref 11.7–15.5)
MCH: 20.2 pg — ABNORMAL LOW (ref 27.0–33.0)
MCHC: 29.9 g/dL — ABNORMAL LOW (ref 32.0–36.0)
MCV: 67.8 fL — ABNORMAL LOW (ref 80.0–100.0)
MPV: 10.1 fL (ref 7.5–12.5)
Monocytes Relative: 7.2 %
Neutro Abs: 2003 {cells}/uL (ref 1500–7800)
Neutrophils Relative %: 37.8 %
Platelets: 285 10*3/uL (ref 140–400)
RBC: 5.09 10*6/uL (ref 3.80–5.10)
RDW: 16.1 % — ABNORMAL HIGH (ref 11.0–15.0)
Total Lymphocyte: 53.3 %
WBC: 5.3 10*3/uL (ref 3.8–10.8)

## 2024-01-13 LAB — COMPLETE METABOLIC PANEL WITH GFR
AG Ratio: 1.3 (calc) (ref 1.0–2.5)
ALT: 12 U/L (ref 6–29)
AST: 18 U/L (ref 10–35)
Albumin: 4.3 g/dL (ref 3.6–5.1)
Alkaline phosphatase (APISO): 74 U/L (ref 37–153)
BUN/Creatinine Ratio: 23 (calc) — ABNORMAL HIGH (ref 6–22)
BUN: 23 mg/dL (ref 7–25)
CO2: 27 mmol/L (ref 20–32)
Calcium: 9.3 mg/dL (ref 8.6–10.4)
Chloride: 97 mmol/L — ABNORMAL LOW (ref 98–110)
Creat: 0.99 mg/dL — ABNORMAL HIGH (ref 0.60–0.95)
Globulin: 3.2 g/dL (ref 1.9–3.7)
Glucose, Bld: 130 mg/dL — ABNORMAL HIGH (ref 65–99)
Potassium: 4.9 mmol/L (ref 3.5–5.3)
Sodium: 132 mmol/L — ABNORMAL LOW (ref 135–146)
Total Bilirubin: 0.7 mg/dL (ref 0.2–1.2)
Total Protein: 7.5 g/dL (ref 6.1–8.1)
eGFR: 53 mL/min/{1.73_m2} — ABNORMAL LOW (ref >=60)

## 2024-01-13 LAB — HEMOGLOBIN A1C
Hgb A1c MFr Bld: 8.7 %{Hb} — ABNORMAL HIGH (ref <5.7)
Mean Plasma Glucose: 203 mg/dL
eAG (mmol/L): 11.2 mmol/L

## 2024-01-13 LAB — URIC ACID: Uric Acid, Serum: 6.5 mg/dL (ref 2.5–7.0)

## 2024-01-13 LAB — LIPID PANEL
Cholesterol: 138 mg/dL (ref <200)
HDL: 66 mg/dL (ref >=50)
LDL Cholesterol (Calc): 58 mg/dL
Non-HDL Cholesterol (Calc): 72 mg/dL (ref <130)
Total CHOL/HDL Ratio: 2.1 (calc) (ref <5.0)
Triglycerides: 62 mg/dL (ref <150)

## 2024-01-13 LAB — TSH: TSH: 2.02 m[IU]/L (ref 0.40–4.50)

## 2024-01-13 LAB — CBC MORPHOLOGY

## 2024-01-19 ENCOUNTER — Other Ambulatory Visit: Payer: Self-pay | Admitting: Family Medicine

## 2024-01-19 ENCOUNTER — Encounter: Payer: Self-pay | Admitting: Family Medicine

## 2024-01-19 ENCOUNTER — Ambulatory Visit (INDEPENDENT_AMBULATORY_CARE_PROVIDER_SITE_OTHER): Payer: Medicare Other | Admitting: Family Medicine

## 2024-01-19 VITALS — BP 122/78 | HR 67 | Ht 62.0 in | Wt 110.0 lb

## 2024-01-19 DIAGNOSIS — I700 Atherosclerosis of aorta: Secondary | ICD-10-CM | POA: Diagnosis not present

## 2024-01-19 DIAGNOSIS — Z7984 Long term (current) use of oral hypoglycemic drugs: Secondary | ICD-10-CM

## 2024-01-19 DIAGNOSIS — E1140 Type 2 diabetes mellitus with diabetic neuropathy, unspecified: Secondary | ICD-10-CM

## 2024-01-19 DIAGNOSIS — Z23 Encounter for immunization: Secondary | ICD-10-CM | POA: Diagnosis not present

## 2024-01-19 DIAGNOSIS — I10 Essential (primary) hypertension: Secondary | ICD-10-CM

## 2024-01-19 DIAGNOSIS — F331 Major depressive disorder, recurrent, moderate: Secondary | ICD-10-CM

## 2024-01-19 DIAGNOSIS — I6930 Unspecified sequelae of cerebral infarction: Secondary | ICD-10-CM

## 2024-01-19 DIAGNOSIS — D508 Other iron deficiency anemias: Secondary | ICD-10-CM

## 2024-01-19 DIAGNOSIS — E782 Mixed hyperlipidemia: Secondary | ICD-10-CM

## 2024-01-19 DIAGNOSIS — M1A09X0 Idiopathic chronic gout, multiple sites, without tophus (tophi): Secondary | ICD-10-CM

## 2024-01-19 MED ORDER — METFORMIN HCL ER 750 MG PO TB24
750.0000 mg | ORAL_TABLET | Freq: Two times a day (BID) | ORAL | 1 refills | Status: DC
Start: 2024-01-19 — End: 2024-08-02

## 2024-01-19 NOTE — Progress Notes (Signed)
 Subjective:    Patient ID: Gabriela Robles, female    DOB: 1929-03-01, 89 y.o.   MRN: [id number]  Gabriela Robles is a 89 y.o. female presenting on 01/19/2024 for Diabetes   HPI  Discussed the use of AI scribe software for clinical note transcription with the patient, who gave verbal consent to proceed.  History of Present Illness   Gabriela Robles is a 89 year old female with diabetes who presents for follow-up on blood sugar, mood, and blood pressure. She is accompanied by her family  Her blood sugar levels have been slightly elevated, with a recent A1c of 8.7, up from 8.4. She is currently taking metformin 500 mg twice daily. Her diet includes a lot of sweet fruits like mango and banana, which may contribute to her elevated blood sugar levels. Her sister notes that she eats frequently and has difficulty controlling portion sizes, partly due to limited activities and hearing difficulties.  Her mood and blood pressure were also discussed, but no specific concerns or changes were noted. She is maintaining her weight and her blood pressure is stable.  Her cholesterol levels are reported to be excellent, and her kidney function has improved compared to a year ago. Her liver enzymes have normalized, and her hemoglobin level is at 10.3, which is an improvement from previous levels in the 9 range. Her uric acid levels have decreased to 6, indicating better control of gout symptoms.  Major Depression recurrent Cognitive decline She lives with her family and has limited activities due to hearing difficulties.  Previously attempted medicine without success       Atherosclerosis Aorta Seen on CT image 10/2020. On Statin therapy No concerns or complaints      01/19/2024    6:40 PM 07/21/2023   11:49 AM 04/17/2023   10:28 AM  Depression screen PHQ 2/9  Decreased Interest 1 1 1   Down, Depressed, Hopeless 2 1 2   PHQ - 2 Score 3 2 3   Altered sleeping 1 0 1  Tired, decreased energy 2  1 3   Change in appetite 0 1 2  Feeling bad or failure about yourself  0 2 1  Trouble concentrating 0 0 3  Moving slowly or fidgety/restless 1 3 3   Suicidal thoughts 1 1 2   PHQ-9 Score 8 10 18   Difficult doing work/chores Very difficult Very difficult Very difficult       07/21/2023   11:49 AM 04/17/2023   10:29 AM 01/11/2022   11:05 AM 12/27/2021   10:29 AM  GAD 7 : Generalized Anxiety Score  Nervous, Anxious, on Edge 0 1 1 1   Control/stop worrying 0 1 1 1   Worry too much - different things 1 0 1 1  Trouble relaxing 0 2  0  Restless 0 0 0 0  Easily annoyed or irritable 2 1 0 0  Afraid - awful might happen 0 0 1 1  Total GAD 7 Score 3 5  4   Anxiety Difficulty Somewhat difficult Somewhat difficult Not difficult at all Not difficult at all    Social History   Tobacco Use   Smoking status: Never   Smokeless tobacco: Never  Vaping Use   Vaping status: Never Used  Substance Use Topics   Alcohol use: No    Alcohol/week: 0.0 standard drinks of alcohol   Drug use: No    Review of Systems  HENT:  Positive for hearing loss.    Per HPI unless specifically indicated above     Objective:  BP 122/78 (BP Location: Left Arm, Patient Position: Sitting)   Pulse 67   Ht 5\' 2"  (1.575 m)   Wt 110 lb (49.9 kg)   SpO2 99%   BMI 20.12 kg/m   Wt Readings from Last 3 Encounters:  01/19/24 110 lb (49.9 kg)  07/21/23 110 lb (49.9 kg)  04/17/23 108 lb (49 kg)    Physical Exam Vitals and nursing note reviewed.  Constitutional:      General: She is not in acute distress.    Appearance: Normal appearance. She is well-developed. She is not diaphoretic.     Comments: Elderly 89 yr female, comfortable, cooperative  HENT:     Head: Normocephalic and atraumatic.  Eyes:     General:        Right eye: No discharge.        Left eye: No discharge.     Conjunctiva/sclera: Conjunctivae normal.  Cardiovascular:     Rate and Rhythm: Normal rate.  Pulmonary:     Effort: Pulmonary effort is  normal.  Skin:    General: Skin is warm and dry.     Findings: No erythema or rash.  Neurological:     Mental Status: She is alert and oriented to person, place, and time.  Psychiatric:        Mood and Affect: Mood normal.        Behavior: Behavior normal.        Thought Content: Thought content normal.     Comments: Well groomed, good eye contact. Limited participation in exam and history     Results for orders placed or performed in visit on 01/12/24  TSH   Collection Time: 01/12/24  9:57 AM  Result Value Ref Range   TSH 2.02 0.40 - 4.50 mIU/L  Uric acid   Collection Time: 01/12/24  9:57 AM  Result Value Ref Range   Uric Acid, Serum 6.5 2.5 - 7.0 mg/dL  CBC with Differential/Platelet   Collection Time: 01/12/24  9:57 AM  Result Value Ref Range   WBC 5.3 3.8 - 10.8 Thousand/uL   RBC 5.09 3.80 - 5.10 Million/uL   Hemoglobin 10.3 (L) 11.7 - 15.5 g/dL   HCT 16.1 (L) 09.6 - 04.5 %   MCV 67.8 (L) 80.0 - 100.0 fL   MCH 20.2 (L) 27.0 - 33.0 pg   MCHC 29.9 (L) 32.0 - 36.0 g/dL   RDW 40.9 (H) 81.1 - 91.4 %   Platelets 285 140 - 400 Thousand/uL   MPV 10.1 7.5 - 12.5 fL   Neutro Abs 2,003 1,500 - 7,800 cells/uL   Absolute Lymphocytes 2,825 850 - 3,900 cells/uL   Absolute Monocytes 382 200 - 950 cells/uL   Eosinophils Absolute 69 15 - 500 cells/uL   Basophils Absolute 21 0 - 200 cells/uL   Neutrophils Relative % 37.8 %   Total Lymphocyte 53.3 %   Monocytes Relative 7.2 %   Eosinophils Relative 1.3 %   Basophils Relative 0.4 %  COMPLETE METABOLIC PANEL WITH GFR   Collection Time: 01/12/24  9:57 AM  Result Value Ref Range   Glucose, Bld 130 (H) 65 - 99 mg/dL   BUN 23 7 - 25 mg/dL   Creat 7.82 (H) 9.56 - 0.95 mg/dL   eGFR 53 (L) > OR = 60 mL/min/1.65m2   BUN/Creatinine Ratio 23 (H) 6 - 22 (calc)   Sodium 132 (L) 135 - 146 mmol/L   Potassium 4.9 3.5 - 5.3 mmol/L   Chloride 97 (L) 98 -  110 mmol/L   CO2 27 20 - 32 mmol/L   Calcium 9.3 8.6 - 10.4 mg/dL   Total Protein 7.5 6.1  - 8.1 g/dL   Albumin 4.3 3.6 - 5.1 g/dL   Globulin 3.2 1.9 - 3.7 g/dL (calc)   AG Ratio 1.3 1.0 - 2.5 (calc)   Total Bilirubin 0.7 0.2 - 1.2 mg/dL   Alkaline phosphatase (APISO) 74 37 - 153 U/L   AST 18 10 - 35 U/L   ALT 12 6 - 29 U/L  Lipid panel   Collection Time: 01/12/24  9:57 AM  Result Value Ref Range   Cholesterol 138 <200 mg/dL   HDL 66 > OR = 50 mg/dL   Triglycerides 62 <409 mg/dL   LDL Cholesterol (Calc) 58 mg/dL (calc)   Total CHOL/HDL Ratio 2.1 <5.0 (calc)   Non-HDL Cholesterol (Calc) 72 <811 mg/dL (calc)  Hemoglobin B1Y   Collection Time: 01/12/24  9:57 AM  Result Value Ref Range   Hgb A1c MFr Bld 8.7 (H) <5.7 % of total Hgb   Mean Plasma Glucose 203 mg/dL   eAG (mmol/L) 78.2 mmol/L  CBC MORPHOLOGY   Collection Time: 01/12/24  9:57 AM  Result Value Ref Range   CBC MORPHOLOGY  NORMAL      Assessment & Plan:   Problem List Items Addressed This Visit     Atherosclerosis of aorta (HCC)   History of cerebrovascular accident (CVA) with residual deficit   Moderate episode of recurrent major depressive disorder (HCC)   Other Visit Diagnoses       Controlled type 2 diabetes mellitus with diabetic neuropathy, without long-term current use of insulin (HCC)    -  Primary   Relevant Medications   metFORMIN (GLUCOPHAGE-XR) 750 MG 24 hr tablet     Need for Streptococcus pneumoniae vaccination       Relevant Orders   Pneumococcal conjugate vaccine 20-valent (Completed)     Long term current use of oral hypoglycemic drug            Type 2 Diabetes Mellitus Hemoglobin A1c increased to 8.7, indicating suboptimal glycemic control.  Dietary modifications discussed but goal to prioritize nutrition and healthy foods including fruits instead of tighter glucose control to restrict her preferred foods. Increased metformin to manage glycemic levels. - Increase metformin to 750 mg twice daily. - Monitor blood glucose levels.  Hyperlipidemia Cholesterol levels excellent with  current medication. Continue Atorvastatin 20mg   Anemia Hemoglobin improved to 10.3, indicating better iron status. - Monitor hemoglobin levels.  Gout Uric acid level decreased to 6, indicating good control. - Continue current gout management regimen.  General Health Maintenance Due for pneumonia vaccination. Discussed benefits and addressed concerns. - Administer pneumonia vaccine today.  Follow-up Reassess blood work in six months for diabetes, cholesterol, anemia, and gout. - Schedule follow-up blood panel in September. - Provide printed copy of appointment details.        Orders Placed This Encounter  Procedures   Pneumococcal conjugate vaccine 20-valent    Meds ordered this encounter  Medications   metFORMIN (GLUCOPHAGE-XR) 750 MG 24 hr tablet    Sig: Take 1 tablet (750 mg total) by mouth 2 (two) times daily with a meal.    Dispense:  180 tablet    Refill:  1    Dose increase Metformin 500 up to 750    Follow up plan: Return in about 6 months (around 07/21/2024) for 6 month fasting lab then 1 week later Follow-up Lab results,  Diabetes, Anemia.  Future labs ordered for 06/2024   Saralyn Pilar, DO [facility] 01/19/2024, 11:26 AM

## 2024-01-19 NOTE — Patient Instructions (Addendum)
 Thank you for coming to the office today.  Recent Labs    04/17/23 1023 01/12/24 0957  HGBA1C 8.4* 8.7*    Dose increase Metformin XR 500mg  up to 750mg  dose. Please take higher dose now, stop old one and start new one. Okay to keep eating current fruit, caution with high sugar.  Please schedule a Follow-up Appointment to: Return in about 6 months (around 07/21/2024) for 6 month fasting lab then 1 week later Follow-up Lab results, Diabetes, Anemia.  If you have any other questions or concerns, please feel free to call the office or send a message through MyChart. You may also schedule an earlier appointment if necessary.  Additionally, you may be receiving a survey about your experience at our office within a few days to 1 week by e-mail or mail. We value your feedback.  Saralyn Pilar, DO [facility], [place]

## 2024-04-16 ENCOUNTER — Ambulatory Visit: Payer: Self-pay

## 2024-04-16 ENCOUNTER — Ambulatory Visit: Payer: Medicare Other

## 2024-06-03 DIAGNOSIS — N3942 Incontinence without sensory awareness: Secondary | ICD-10-CM

## 2024-06-04 ENCOUNTER — Ambulatory Visit (INDEPENDENT_AMBULATORY_CARE_PROVIDER_SITE_OTHER)

## 2024-06-04 DIAGNOSIS — Z0000 Encounter for general adult medical examination without abnormal findings: Secondary | ICD-10-CM

## 2024-06-04 DIAGNOSIS — R32 Unspecified urinary incontinence: Secondary | ICD-10-CM | POA: Insufficient documentation

## 2024-06-04 NOTE — Progress Notes (Signed)
 Subjective:   Gabriela Robles is a 89 y.o. who presents for a Medicare Wellness preventive visit.  As a reminder, Annual Wellness Visits don't include a physical exam, and some assessments may be limited, especially if this visit is performed virtually. We may recommend an in-person follow-up visit with your provider if needed.  Visit Complete: Virtual I connected with  Gabriela Robles on 06/04/24 by a audio enabled telemedicine application and verified that I am speaking with the correct person using two identifiers.  Patient Location: Home  Provider Location: Home Office  I discussed the limitations of evaluation and management by telemedicine. The patient expressed understanding and agreed to proceed.  Vital Signs: Because this visit was a virtual/telehealth visit, some criteria may be missing or patient reported. Any vitals not documented were not able to be obtained and vitals that have been documented are patient reported.  VideoDeclined- This patient declined Librarian, academic. Therefore the visit was completed with audio only.  Persons Participating in Visit: Patient assisted by daughter.  AWV Questionnaire: No: Patient Medicare AWV questionnaire was not completed prior to this visit.  Cardiac Risk Factors include: advanced age (>5men, >51 women);diabetes mellitus;hypertension;sedentary lifestyle;dyslipidemia     Objective:    Today's Vitals   06/04/24 1245  PainSc: 4    There is no height or weight on file to calculate BMI.     06/04/2024   12:52 PM 04/11/2023    8:53 AM 11/07/2020   10:25 AM 10/30/2020    1:00 AM 05/12/2020    9:48 AM 01/25/2020   11:13 AM 01/05/2020    7:39 AM  Advanced Directives  Does Patient Have a Medical Advance Directive? No No Yes No No No Unable to assess, patient is non-responsive or altered mental status  Type of Best boy of [place];Living will      Copy of Healthcare Power of  Attorney in Chart?   No - copy requested      Would patient like information on creating a medical advance directive? No - Patient declined No - Patient declined  No - Patient declined;No - Guardian declined No - Patient declined      Current Medications (verified) Outpatient Encounter Medications as of 06/04/2024  Medication Sig   atorvastatin  (LIPITOR) 20 MG tablet Take 1 tablet (20 mg total) by mouth daily.   lisinopril  (ZESTRIL ) 10 MG tablet Take 1 tablet (10 mg total) by mouth daily.   metFORMIN  (GLUCOPHAGE -XR) 750 MG 24 hr tablet Take 1 tablet (750 mg total) by mouth 2 (two) times daily with a meal.   No facility-administered encounter medications on file as of 06/04/2024.    Allergies (verified) Patient has no known allergies.   History: Past Medical History:  Diagnosis Date   Acute ischemic left MCA stroke (HCC)    Arthritis    Diabetes mellitus without complication (HCC)    History of stroke 2017   Hyperlipidemia    Hypertension    Past Surgical History:  Procedure Laterality Date   LIVER SURGERY     per her son   Family History  Problem Relation Age of Onset   Cancer Daughter        bone cancer   Social History   Socioeconomic History   Marital status: Widowed    Spouse name: Not on file   Number of children: 10   Years of education: Not on file   Highest education level: Not on file  Occupational History  Occupation: retired  Tobacco Use   Smoking status: Never   Smokeless tobacco: Never  Vaping Use   Vaping status: Never Used  Substance and Sexual Activity   Alcohol use: No    Alcohol/week: 0.0 standard drinks of alcohol   Drug use: No   Sexual activity: Not Currently  Other Topics Concern   Not on file  Social History Narrative   5 children living and 5 children deceased.    Social Drivers of Corporate investment banker Strain: Low Risk  (06/04/2024)   Overall Financial Resource Strain (CARDIA)    Difficulty of Paying Living Expenses: Not hard  at all  Food Insecurity: No Food Insecurity (06/04/2024)   Hunger Vital Sign    Worried About Running Out of Food in the Last Year: Never true    Ran Out of Food in the Last Year: Never true  Transportation Needs: No Transportation Needs (06/04/2024)   PRAPARE - Administrator, Civil Service (Medical): No    Lack of Transportation (Non-Medical): No  Physical Activity: Insufficiently Active (06/04/2024)   Exercise Vital Sign    Days of Exercise per Week: 2 days    Minutes of Exercise per Session: 20 min  Stress: No Stress Concern Present (06/04/2024)   [employer] of Occupational Health - Occupational Stress Questionnaire    Feeling of Stress: Not at all  Social Connections: Socially Isolated (06/04/2024)   Social Connection and Isolation Panel    Frequency of Communication with Friends and Family: Never    Frequency of Social Gatherings with Friends and Family: Once a week    Attends Religious Services: Never    Database administrator or Organizations: No    Attends Banker Meetings: Never    Marital Status: Widowed    Tobacco Counseling Counseling given: Not Answered    Clinical Intake:  Pre-visit preparation completed: Yes  Pain : 0-10 Pain Score: 4  Pain Type: Chronic pain Pain Location: Leg Pain Orientation: Right, Left Pain Descriptors / Indicators: Aching, Discomfort, Constant Pain Onset: More than a month ago Pain Frequency: Constant     BMI - recorded: 20.1 Nutritional Status: BMI of 19-24  Normal Nutritional Risks: None Diabetes: Yes CBG done?: No Did pt. bring in CBG monitor from home?: No  Lab Results  Component Value Date   HGBA1C 8.7 (H) 01/12/2024   HGBA1C 8.4 (A) 04/17/2023   HGBA1C 8.4 (H) 12/27/2021     How often do you need to have someone help you when you read instructions, pamphlets, or other written materials from your doctor or pharmacy?: 5 - Always  Interpreter Needed?: Yes Interpreter Name: daughter Patient  Declined Interpreter : No  Information entered by :: JHONNIE DAS, LPN   Activities of Daily Living    06/04/2024   12:53 PM  In your present state of health, do you have any difficulty performing the following activities:  Hearing? 1  Vision? 0  Difficulty concentrating or making decisions? 1  Walking or climbing stairs? 1  Dressing or bathing? 0  Doing errands, shopping? 1  Preparing Food and eating ? Y  Using the Toilet? N  In the past six months, have you accidently leaked urine? Y  Do you have problems with loss of bowel control? Y  Managing your Medications? Y  Managing your Finances? Y  Housekeeping or managing your Housekeeping? Y    Patient Care Team: Edman Marsa PARAS, DO as PCP - [facility],  [facility]   I have updated your Care Teams any recent Medical Services you may have received from other providers in the past year.     Assessment:   This is a routine wellness examination for Mistina.  Hearing/Vision screen Hearing Screening - Comments:: HAS AIDS, BUT REFUSES TO WEAR THEM Vision Screening - Comments:: READERS FOR SEWING- [facility]   Goals Addressed             This Visit's Progress    DIET - REDUCE SUGAR INTAKE         Depression Screen     06/04/2024   12:50 PM 01/19/2024    6:40 PM 07/21/2023   11:49 AM 04/17/2023   10:28 AM 04/11/2023    8:50 AM 01/11/2022   11:05 AM 12/27/2021   10:29 AM  PHQ 2/9 Scores  PHQ - 2 Score 1 3 2 3 2  0 0  PHQ- 9 Score 2 8 10 18  1 1     Fall Risk     06/04/2024   12:53 PM 07/21/2023   11:50 AM 04/11/2023    8:54 AM 01/11/2022   11:05 AM 12/27/2021   10:28 AM  Fall Risk   Falls in the past year? 0 0 0 0 0  Number falls in past yr: 0 0 0 0 0  Injury with Fall? 0 0 0 0 0  Risk for fall due to : No Fall Risks  No Fall Risks Impaired mobility No Fall Risks  Follow up Falls evaluation completed;Falls prevention discussed  Falls prevention discussed;Falls evaluation completed  Falls evaluation completed  Falls evaluation completed      Data saved with a previous flowsheet row definition    MEDICARE RISK AT HOME:  Medicare Risk at Home Any stairs in or around the home?: Yes If so, are there any without handrails?: Yes Home free of loose throw rugs in walkways, pet beds, electrical cords, etc?: Yes Adequate lighting in your home to reduce risk of falls?: Yes Life alert?: No Use of a cane, walker or w/c?: Yes (CANE ALL THE TIME) Grab bars in the bathroom?: Yes Shower chair or bench in shower?: Yes Elevated toilet seat or a handicapped toilet?: Yes  TIMED UP AND GO:  Was the test performed?  No  Cognitive Function: 6CIT completed        04/11/2023    9:00 AM  6CIT Screen  What Year? 4 points  What month? 3 points  What time? 3 points  Count back from 20 4 points  Months in reverse 4 points  Repeat phrase 10 points  Total Score 28 points    Immunizations Immunization History  Administered Date(s) Administered   Fluad Quad(high Dose 65+) 09/29/2019, 11/25/2022   Fluad Trivalent(High Dose 65+) 07/21/2023   Influenza, High Dose Seasonal PF 07/24/2015, 08/18/2018   Influenza-Unspecified 08/22/2017   PFIZER Comirnaty(Gray Top)Covid-19 Tri-Sucrose Vaccine 01/14/2021   PNEUMOCOCCAL CONJUGATE-20 01/19/2024   Pneumococcal Conjugate-13 10/06/2018   Pneumococcal Polysaccharide-23 11/02/2019    Screening Tests Health Maintenance  Topic Date Due   Zoster Vaccines- Shingrix (1 of 2) Never done   DEXA SCAN  Never done   COVID-19 Vaccine (2 - 2024-25 season) 06/29/2023   FOOT EXAM  04/16/2024   INFLUENZA VACCINE  05/28/2024   OPHTHALMOLOGY EXAM  06/18/2024   HEMOGLOBIN A1C  07/14/2024   Medicare Annual Wellness (AWV)  06/04/2025   Pneumococcal Vaccine: 50+ Years  Completed   Hepatitis B Vaccines  Aged  Out   HPV VACCINES  Aged Out   Meningococcal B Vaccine  Aged Out   DTaP/Tdap/Td  Discontinued    Health Maintenance  Health Maintenance Due   Topic Date Due   Zoster Vaccines- Shingrix (1 of 2) Never done   DEXA SCAN  Never done   COVID-19 Vaccine (2 - 2024-25 season) 06/29/2023   FOOT EXAM  04/16/2024   INFLUENZA VACCINE  05/28/2024   Health Maintenance Items Addressed: AGED OUT OF MAMMOGRAM, BDS & COLONOSCOPY; UP TO DATE ON PNA- NEEDS COVID  Additional Screening:  Vision Screening: Recommended annual ophthalmology exams for early detection of glaucoma and other disorders of the eye. Would you like a referral to an eye doctor? No    Dental Screening: Recommended annual dental exams for proper oral hygiene  Community Resource Referral / Chronic Care Management: CRR required this visit?  No   CCM required this visit?  No   Plan:    I have personally reviewed and noted the following in the patient's chart:   Medical and social history Use of alcohol, tobacco or illicit drugs  Current medications and supplements including opioid prescriptions. Patient is not currently taking opioid prescriptions. Functional ability and status Nutritional status Physical activity Advanced directives List of other physicians Hospitalizations, surgeries, and ER visits in previous 12 months Vitals Screenings to include cognitive, depression, and falls Referrals and appointments  In addition, I have reviewed and discussed with patient certain preventive protocols, quality metrics, and best practice recommendations. A written personalized care plan for preventive services as well as general preventive health recommendations were provided to patient.   Jhonnie GORMAN Das, LPN   10/28/7972   After Visit Summary: (MyChart) Due to this being a telephonic visit, the after visit summary with patients personalized plan was offered to patient via MyChart   Notes: Nothing significant to report at this time.

## 2024-06-04 NOTE — Patient Instructions (Signed)
 Gabriela Robles , Thank you for taking time out of your busy schedule to complete your Annual Wellness Visit with me. I enjoyed our conversation and look forward to speaking with you again next year. I, as well as your care team,  appreciate your ongoing commitment to your health goals. Please review the following plan we discussed and let me know if I can assist you in the future.  Follow up Visits: 06/17/25 @ 2:40 PM BY PHONE We will see or speak with you next year for your Next Medicare AWV with our clinical staff Have you seen your provider in the last 6 months (3 months if uncontrolled diabetes)? Yes  Clinician Recommendations:  Aim for 30 minutes of exercise or brisk walking, 6-8 glasses of water, and 5 servings of fruits and vegetables each day. TAKE CARE!      This is a list of the screenings recommended for you:  Health Maintenance  Topic Date Due   Zoster (Shingles) Vaccine (1 of 2) Never done   DEXA scan (bone density measurement)  Never done   COVID-19 Vaccine (2 - 2024-25 season) 06/29/2023   Complete foot exam   04/16/2024   Flu Shot  05/28/2024   Eye exam for diabetics  06/18/2024   Hemoglobin A1C  07/14/2024   Medicare Annual Wellness Visit  06/04/2025   Pneumococcal Vaccine for age over 2  Completed   Hepatitis B Vaccine  Aged Out   HPV Vaccine  Aged Out   Meningitis B Vaccine  Aged Out   DTaP/Tdap/Td vaccine  Discontinued    Advanced directives: (ACP Link)Information on Advanced Care Planning can be found at [place]  Secretary of [facility] Advance Health Care Directives Advance Health Care Directives. [email or web address]  Advance Care Planning is important because it:  [x]  Makes sure you receive the medical care that is consistent with your values, goals, and preferences  [x]  It provides guidance to your family and loved ones and reduces their decisional burden about whether or not they are making the right decisions based on your wishes.  Follow the link provided in  your after visit summary or read over the paperwork we have mailed to you to help you started getting your Advance Directives in place. If you need assistance in completing these, please reach out to us  so that we can help you!

## 2024-06-11 ENCOUNTER — Ambulatory Visit

## 2024-06-17 ENCOUNTER — Other Ambulatory Visit: Payer: Self-pay | Admitting: Family Medicine

## 2024-06-17 DIAGNOSIS — I10 Essential (primary) hypertension: Secondary | ICD-10-CM

## 2024-06-18 NOTE — Telephone Encounter (Signed)
 Appt 08/02/24 Requested Prescriptions  Pending Prescriptions Disp Refills   lisinopril  (ZESTRIL ) 10 MG tablet [Pharmacy Med Name: LISINOPRIL  10 MG TABLET] 90 tablet 3    Sig: TAKE 1 TABLET BY MOUTH EVERY DAY     Cardiovascular:  ACE Inhibitors Failed - 06/18/2024 12:13 PM      Failed - Cr in normal range and within 180 days    Creat  Date Value Ref Range Status  01/12/2024 0.99 (H) 0.60 - 0.95 mg/dL Final         Passed - K in normal range and within 180 days    Potassium  Date Value Ref Range Status  01/12/2024 4.9 3.5 - 5.3 mmol/L Final         Passed - Patient is not pregnant      Passed - Last BP in normal range    BP Readings from Last 1 Encounters:  01/19/24 122/78         Passed - Valid encounter within last 6 months    Recent Outpatient Visits           5 months ago Controlled type 2 diabetes mellitus with diabetic neuropathy, without long-term current use of insulin  [facility])   [facility] [place], Marsa PARAS, [place]

## 2024-06-20 ENCOUNTER — Other Ambulatory Visit: Payer: Self-pay | Admitting: Family Medicine

## 2024-06-20 DIAGNOSIS — E782 Mixed hyperlipidemia: Secondary | ICD-10-CM

## 2024-06-21 DIAGNOSIS — Z961 Presence of intraocular lens: Secondary | ICD-10-CM | POA: Diagnosis not present

## 2024-06-21 DIAGNOSIS — H26493 Other secondary cataract, bilateral: Secondary | ICD-10-CM | POA: Diagnosis not present

## 2024-06-21 DIAGNOSIS — H1045 Other chronic allergic conjunctivitis: Secondary | ICD-10-CM | POA: Diagnosis not present

## 2024-06-21 DIAGNOSIS — E119 Type 2 diabetes mellitus without complications: Secondary | ICD-10-CM | POA: Diagnosis not present

## 2024-06-21 DIAGNOSIS — H40013 Open angle with borderline findings, low risk, bilateral: Secondary | ICD-10-CM | POA: Diagnosis not present

## 2024-06-21 NOTE — Telephone Encounter (Signed)
 Requested Prescriptions  Pending Prescriptions Disp Refills   atorvastatin  (LIPITOR) 20 MG tablet [Pharmacy Med Name: ATORVASTATIN  20 MG TABLET] 90 tablet 0    Sig: TAKE 1 TABLET BY MOUTH EVERY DAY     Cardiovascular:  Antilipid - Statins Failed - 06/21/2024  4:13 PM      Failed - Lipid Panel in normal range within the last 12 months    Cholesterol, Total  Date Value Ref Range Status  04/17/2015 209 (H) 100 - 199 mg/dL Final   Cholesterol  Date Value Ref Range Status  01/12/2024 138 <200 mg/dL Final   LDL Cholesterol (Calc)  Date Value Ref Range Status  01/12/2024 58 mg/dL (calc) Final    Comment:    Reference range: <100 . Desirable range <100 mg/dL for primary prevention;   <70 mg/dL for patients with CHD or diabetic patients  with > or = 2 CHD risk factors. SABRA LDL-C is now calculated using the Martin-Hopkins  calculation, which is a validated novel method providing  better accuracy than the Friedewald equation in the  estimation of LDL-C.  Gladis APPLETHWAITE et al. SANDREA. 7986;689(80): 2061-2068  (http://education.[facility].com/faq/FAQ164)    HDL  Date Value Ref Range Status  01/12/2024 66 > OR = 50 mg/dL Final  93/79/7983 55 >60 mg/dL Final    Comment:    According to ATP-III Guidelines, HDL-C >59 mg/dL is considered a negative risk factor for CHD.    Triglycerides  Date Value Ref Range Status  01/12/2024 62 <150 mg/dL Final         Passed - Patient is not pregnant      Passed - Valid encounter within last 12 months    Recent Outpatient Visits           5 months ago Controlled type 2 diabetes mellitus with diabetic neuropathy, without long-term current use of insulin  [facility])   [facility] [place], Marsa PARAS, [place]

## 2024-06-28 ENCOUNTER — Other Ambulatory Visit: Payer: Self-pay | Admitting: Family Medicine

## 2024-06-28 DIAGNOSIS — E1140 Type 2 diabetes mellitus with diabetic neuropathy, unspecified: Secondary | ICD-10-CM

## 2024-06-29 NOTE — Telephone Encounter (Signed)
 Requested Prescriptions  Refused Prescriptions Disp Refills   metFORMIN  (GLUCOPHAGE -XR) 500 MG 24 hr tablet [Pharmacy Med Name: METFORMIN  HCL ER 500 MG TABLET] 90 tablet 3    Sig: TAKE 1 TABLET BY MOUTH EVERY DAY WITH BREAKFAST     Endocrinology:  Diabetes - Biguanides Failed - 06/29/2024  1:42 PM      Failed - Cr in normal range and within 360 days    Creat  Date Value Ref Range Status  01/12/2024 0.99 (H) 0.60 - 0.95 mg/dL Final         Failed - HBA1C is between 0 and 7.9 and within 180 days    Hemoglobin A1C  Date Value Ref Range Status  01/13/2020 9.3  Final    Comment:    [facility]   Hgb A1c MFr Bld  Date Value Ref Range Status  01/12/2024 8.7 (H) <5.7 % of total Hgb Final    Comment:    For someone without known diabetes, a hemoglobin A1c value of 6.5% or greater indicates that they may have  diabetes and this should be confirmed with a follow-up  test. . For someone with known diabetes, a value <7% indicates  that their diabetes is well controlled and a value  greater than or equal to 7% indicates suboptimal  control. A1c targets should be individualized based on  duration of diabetes, age, comorbid conditions, and  other considerations. . Currently, no consensus exists regarding use of hemoglobin A1c for diagnosis of diabetes for children. .          Failed - eGFR in normal range and within 360 days    GFR, Est African American  Date Value Ref Range Status  09/28/2019 64 > OR = 60 mL/min/1.55m2 Final   GFR calc Af Amer  Date Value Ref Range Status  05/12/2020 50 (L) >60 mL/min Final   GFR, Est Non African American  Date Value Ref Range Status  09/28/2019 56 (L) > OR = 60 mL/min/1.71m2 Final   GFR, Estimated  Date Value Ref Range Status  11/02/2020 52 (L) >60 mL/min Final    Comment:    (NOTE) Calculated using the CKD-EPI Creatinine Equation (2021)    eGFR  Date Value Ref Range Status  01/12/2024 53 (L) > OR = 60 mL/min/1.18m2  Final         Failed - B12 Level in normal range and within 720 days    Vitamin B-12  Date Value Ref Range Status  11/02/2020 1,980 (H) 180 - 914 pg/mL Final    Comment:    (NOTE) This assay is not validated for testing neonatal or myeloproliferative syndrome specimens for Vitamin B12 levels. Performed at [facility], [address] [place], [place] [address]          Passed - Valid encounter within last 6 months    Recent Outpatient Visits           5 months ago Controlled type 2 diabetes mellitus with diabetic neuropathy, without long-term current use of insulin  [facility])   [facility] [place], Marsa PARAS, [place]              Passed - CBC within normal limits and completed in the last 12 months    WBC  Date Value Ref Range Status  01/12/2024 5.3 3.8 - 10.8 Thousand/uL Final   RBC  Date Value Ref Range Status  01/12/2024 5.09 3.80 - 5.10 Million/uL Final   Hemoglobin  Date Value Ref Range Status  01/12/2024 10.3 (L) 11.7 - 15.5 g/dL Final  95/95/7982 8.4 (L) 11.1 - 15.9 g/dL Final   HCT  Date Value Ref Range Status  01/12/2024 34.5 (L) 35.0 - 45.0 % Final   Hematocrit  Date Value Ref Range Status  01/30/2016 27.0 (L) 34.0 - 46.6 % Final   MCHC  Date Value Ref Range Status  01/12/2024 29.9 (L) 32.0 - 36.0 g/dL Final    Comment:    For adults, a slight decrease in the calculated MCHC value (in the range of 30 to 32 g/dL) is most likely not clinically significant; however, it should be interpreted with caution in correlation with other red cell parameters and the patient's clinical condition.    [facility]  Date Value Ref Range Status  01/12/2024 20.2 (L) 27.0 - 33.0 pg Final   MCV  Date Value Ref Range Status  01/12/2024 67.8 (L) 80.0 - 100.0 fL Final  01/30/2016 66 (L) 79 - 97 fL Final   No results found for: PLTCOUNTKUC, LABPLAT, POCPLA RDW  Date Value Ref Range Status  01/12/2024 16.1 (H) 11.0 - 15.0 % Final   01/30/2016 17.0 (H) 12.3 - 15.4 % Final

## 2024-07-21 ENCOUNTER — Other Ambulatory Visit

## 2024-07-21 DIAGNOSIS — D508 Other iron deficiency anemias: Secondary | ICD-10-CM | POA: Diagnosis not present

## 2024-07-21 DIAGNOSIS — E1140 Type 2 diabetes mellitus with diabetic neuropathy, unspecified: Secondary | ICD-10-CM

## 2024-07-21 DIAGNOSIS — I10 Essential (primary) hypertension: Secondary | ICD-10-CM

## 2024-07-22 LAB — CBC WITH DIFFERENTIAL/PLATELET
Absolute Lymphocytes: 3124 {cells}/uL (ref 850–3900)
Absolute Monocytes: 513 {cells}/uL (ref 200–950)
Basophils Absolute: 23 {cells}/uL (ref 0–200)
Basophils Relative: 0.4 %
Eosinophils Absolute: 331 {cells}/uL (ref 15–500)
Eosinophils Relative: 5.8 %
HCT: 37.2 % (ref 35.0–45.0)
Hemoglobin: 11 g/dL — ABNORMAL LOW (ref 11.7–15.5)
MCH: 20.6 pg — ABNORMAL LOW (ref 27.0–33.0)
MCHC: 29.6 g/dL — ABNORMAL LOW (ref 32.0–36.0)
MCV: 69.7 fL — ABNORMAL LOW (ref 80.0–100.0)
MPV: 9.9 fL (ref 7.5–12.5)
Monocytes Relative: 9 %
Neutro Abs: 1710 {cells}/uL (ref 1500–7800)
Neutrophils Relative %: 30 %
Platelets: 255 Thousand/uL (ref 140–400)
RBC: 5.34 Million/uL — ABNORMAL HIGH (ref 3.80–5.10)
RDW: 16 % — ABNORMAL HIGH (ref 11.0–15.0)
Total Lymphocyte: 54.8 %
WBC: 5.7 Thousand/uL (ref 3.8–10.8)

## 2024-07-22 LAB — COMPLETE METABOLIC PANEL WITHOUT GFR
AG Ratio: 1.3 (calc) (ref 1.0–2.5)
ALT: 26 U/L (ref 6–29)
AST: 40 U/L — ABNORMAL HIGH (ref 10–35)
Albumin: 4.3 g/dL (ref 3.6–5.1)
Alkaline phosphatase (APISO): 73 U/L (ref 37–153)
BUN/Creatinine Ratio: 26 (calc) — ABNORMAL HIGH (ref 6–22)
BUN: 27 mg/dL — ABNORMAL HIGH (ref 7–25)
CO2: 24 mmol/L (ref 20–32)
Calcium: 9.6 mg/dL (ref 8.6–10.4)
Chloride: 100 mmol/L (ref 98–110)
Creat: 1.02 mg/dL — ABNORMAL HIGH (ref 0.60–0.95)
Globulin: 3.4 g/dL (ref 1.9–3.7)
Glucose, Bld: 100 mg/dL — ABNORMAL HIGH (ref 65–99)
Potassium: 4.4 mmol/L (ref 3.5–5.3)
Sodium: 134 mmol/L — ABNORMAL LOW (ref 135–146)
Total Bilirubin: 0.7 mg/dL (ref 0.2–1.2)
Total Protein: 7.7 g/dL (ref 6.1–8.1)

## 2024-07-22 LAB — MICROALBUMIN / CREATININE URINE RATIO
Creatinine, Urine: 83 mg/dL (ref 20–275)
Microalb Creat Ratio: 18 mg/g{creat} (ref <30)
Microalb, Ur: 1.5 mg/dL

## 2024-07-22 LAB — HEMOGLOBIN A1C
Hgb A1c MFr Bld: 8.3 % — ABNORMAL HIGH (ref <5.7)
Mean Plasma Glucose: 192 mg/dL
eAG (mmol/L): 10.6 mmol/L

## 2024-07-22 LAB — CBC MORPHOLOGY

## 2024-08-02 ENCOUNTER — Ambulatory Visit: Admitting: Family Medicine

## 2024-08-02 ENCOUNTER — Other Ambulatory Visit: Payer: Self-pay | Admitting: Family Medicine

## 2024-08-02 ENCOUNTER — Encounter: Payer: Self-pay | Admitting: Family Medicine

## 2024-08-02 VITALS — BP 132/74 | HR 62 | Ht 62.0 in | Wt 93.1 lb

## 2024-08-02 DIAGNOSIS — M1A09X0 Idiopathic chronic gout, multiple sites, without tophus (tophi): Secondary | ICD-10-CM

## 2024-08-02 DIAGNOSIS — N3946 Mixed incontinence: Secondary | ICD-10-CM

## 2024-08-02 DIAGNOSIS — N1831 Chronic kidney disease, stage 3a: Secondary | ICD-10-CM | POA: Diagnosis not present

## 2024-08-02 DIAGNOSIS — E1142 Type 2 diabetes mellitus with diabetic polyneuropathy: Secondary | ICD-10-CM

## 2024-08-02 DIAGNOSIS — I6930 Unspecified sequelae of cerebral infarction: Secondary | ICD-10-CM

## 2024-08-02 DIAGNOSIS — R112 Nausea with vomiting, unspecified: Secondary | ICD-10-CM

## 2024-08-02 DIAGNOSIS — E782 Mixed hyperlipidemia: Secondary | ICD-10-CM | POA: Diagnosis not present

## 2024-08-02 DIAGNOSIS — Z23 Encounter for immunization: Secondary | ICD-10-CM | POA: Diagnosis not present

## 2024-08-02 DIAGNOSIS — I10 Essential (primary) hypertension: Secondary | ICD-10-CM | POA: Diagnosis not present

## 2024-08-02 DIAGNOSIS — E1140 Type 2 diabetes mellitus with diabetic neuropathy, unspecified: Secondary | ICD-10-CM

## 2024-08-02 DIAGNOSIS — Z7984 Long term (current) use of oral hypoglycemic drugs: Secondary | ICD-10-CM | POA: Diagnosis not present

## 2024-08-02 DIAGNOSIS — R152 Fecal urgency: Secondary | ICD-10-CM | POA: Diagnosis not present

## 2024-08-02 DIAGNOSIS — R159 Full incontinence of feces: Secondary | ICD-10-CM

## 2024-08-02 MED ORDER — ATORVASTATIN CALCIUM 20 MG PO TABS: 20.0000 mg | ORAL_TABLET | Freq: Every day | ORAL | 3 refills | Status: AC

## 2024-08-02 MED ORDER — LISINOPRIL 10 MG PO TABS
10.0000 mg | ORAL_TABLET | Freq: Every day | ORAL | 3 refills | Status: AC
Start: 2024-08-02 — End: ?

## 2024-08-02 MED ORDER — METFORMIN HCL ER 750 MG PO TB24
750.0000 mg | ORAL_TABLET | Freq: Two times a day (BID) | ORAL | 3 refills | Status: AC
Start: 2024-08-02 — End: ?

## 2024-08-02 MED ORDER — ONDANSETRON 4 MG PO TBDP: 4.0000 mg | ORAL_TABLET | Freq: Three times a day (TID) | ORAL | 0 refills | Status: AC | PRN

## 2024-08-02 NOTE — Patient Instructions (Addendum)
 Thank you for coming to the office today.  [facility] Phone: [phone]  Ordered Urinary incontinence supplies up to 3 times or changes per day, they should ship to you.  Refills sent to pharmacy  Lab results look good, improved iron level and blood sugar.  Flu Shot today  Tremors are likely due to muscle and nerve weakness. Keep improving nutrition.  Please schedule a Follow-up Appointment to: Return for 6 month fasting lab > 1 week later Follow-up Lab results, DM, Anemia.  If you have any other questions or concerns, please feel free to call the office or send a message through MyChart. You may also schedule an earlier appointment if necessary.  Additionally, you may be receiving a survey about your experience at our office within a few days to 1 week by e-mail or mail. We value your feedback.  Marsa Officer, DO [facility], [place]

## 2024-08-02 NOTE — Addendum Note (Signed)
 Addended by: EDMAN MARSA PARAS on: 08/02/2024 06:07 PM   Modules accepted: Level of Service

## 2024-08-02 NOTE — Progress Notes (Addendum)
 Subjective:    Patient ID: Gabriela Robles, female    DOB: 1928/12/02, 89 y.o.   MRN: [id number]  Gabriela Robles is a 89 y.o. female presenting on 08/02/2024 for Medical Management of Chronic Issues, Diabetes, and Anemia   HPI  Discussed the use of AI scribe software for clinical note transcription with the patient, who gave verbal consent to proceed.  History of Present Illness   Gabriela Robles is a 89 year old female with diabetes who presents with recent episodes of nausea and vomiting.  Nausea and vomiting - Episodes of nausea and vomiting over the past couple of weeks, primarily after eating - Reduced appetite due to fear of vomiting - Symptoms have improved in the past few days; able to eat without vomiting - No diarrhea  Type 2 Diabetes Glycemic control - Diabetes managed with metformin  XR 750 mg twice daily - Recent blood sugar level of 8.3, improved from previous level of 8.7  Anemia - History of low iron levels - Recent hemoglobin level of 11, best in three years - Previous hemoglobin levels ranged from 9 to 10  Urinary and fecal incontinence - Urinary and fecal incontinence for the past couple of years - Uses diapers due to slow mobility and distance to restroom - Changes diapers independently - She will need up to 4 pairs to change per day due to frequency of incontinence accidents. - Experiences accidents due to urgency and limited restroom access, especially when gardening outdoors  Essential Tremors - Tremors in arms and legs, described as 'shaking' - Tremors occur particularly when focusing on tasks such as pouring or picking up food  Her mood and blood pressure were also discussed, but no specific concerns or changes were noted. She is maintaining her weight and her blood pressure is stable.   Major Depression recurrent Cognitive decline She lives with her family and has limited activities due to hearing difficulties.  Previously attempted  medicine without success       Health Maintenance: Flu Shot today     08/02/2024   11:01 AM 06/04/2024   12:50 PM 01/19/2024    6:40 PM  Depression screen PHQ 2/9  Decreased Interest 1 0 1  Down, Depressed, Hopeless 2 1 2   PHQ - 2 Score 3 1 3   Altered sleeping 0 1 1  Tired, decreased energy 3 0 2  Change in appetite 2 0 0  Feeling bad or failure about yourself  1 0 0  Trouble concentrating 0 0 0  Moving slowly or fidgety/restless 1 0 1  Suicidal thoughts 1 0 1  PHQ-9 Score 11 2 8   Difficult doing work/chores Very difficult Not difficult at all Very difficult       08/02/2024   11:01 AM 07/21/2023   11:49 AM 04/17/2023   10:29 AM 01/11/2022   11:05 AM  GAD 7 : Generalized Anxiety Score  Nervous, Anxious, on Edge 1 0 1 1  Control/stop worrying 1 0 1 1  Worry too much - different things 1 1 0 1  Trouble relaxing 1 0 2   Restless 0 0 0 0  Easily annoyed or irritable 2 2 1  0  Afraid - awful might happen 0 0 0 1  Total GAD 7 Score 6 3 5    Anxiety Difficulty Somewhat difficult Somewhat difficult Somewhat difficult Not difficult at all    Social History   Tobacco Use   Smoking status: Never   Smokeless tobacco: Never  Vaping Use  Vaping status: Never Used  Substance Use Topics   Alcohol use: No    Alcohol/week: 0.0 standard drinks of alcohol   Drug use: No    Review of Systems Per HPI unless specifically indicated above     Objective:    BP 132/74 (BP Location: Left Arm, Patient Position: Sitting, Cuff Size: Normal)   Pulse 62   Ht 5' 2 (1.575 m)   Wt 93 lb 2 oz (42.2 kg)   BMI 17.03 kg/m   Wt Readings from Last 3 Encounters:  08/02/24 93 lb 2 oz (42.2 kg)  01/19/24 110 lb (49.9 kg)  07/21/23 110 lb (49.9 kg)    Physical Exam Vitals and nursing note reviewed.  Constitutional:      General: She is not in acute distress.    Appearance: Normal appearance. She is well-developed. She is not diaphoretic.     Comments: Thin Elderly 89 yr female, comfortable,  cooperative  HENT:     Head: Normocephalic and atraumatic.  Eyes:     General:        Right eye: No discharge.        Left eye: No discharge.     Conjunctiva/sclera: Conjunctivae normal.  Cardiovascular:     Rate and Rhythm: Normal rate.  Pulmonary:     Effort: Pulmonary effort is normal.  Skin:    General: Skin is warm and dry.     Findings: No erythema or rash.  Neurological:     Mental Status: She is alert and oriented to person, place, and time.  Psychiatric:        Mood and Affect: Mood normal.        Behavior: Behavior normal.        Thought Content: Thought content normal.     Comments: Well groomed, good eye contact. Limited participation in exam and history     Diabetic Foot Exam - Simple   Simple Foot Form Diabetic Foot exam was performed with the following findings: Yes 08/02/2024 11:30 AM  Visual Inspection See comments: Yes Sensation Testing See comments: Yes Pulse Check Posterior Tibialis and Dorsalis pulse intact bilaterally: Yes Comments Bilateral Deformity with hammer toe and callus formation, nail thickening. No ulceration. Reduced monofilament sensation Right foot      Current Outpatient Medications:    ondansetron  (ZOFRAN -ODT) 4 MG disintegrating tablet, Take 1 tablet (4 mg total) by mouth every 8 (eight) hours as needed for nausea or vomiting., Disp: 30 tablet, Rfl: 0   atorvastatin  (LIPITOR) 20 MG tablet, Take 1 tablet (20 mg total) by mouth daily., Disp: 90 tablet, Rfl: 3   lisinopril  (ZESTRIL ) 10 MG tablet, Take 1 tablet (10 mg total) by mouth daily., Disp: 90 tablet, Rfl: 3   metFORMIN  (GLUCOPHAGE -XR) 750 MG 24 hr tablet, Take 1 tablet (750 mg total) by mouth 2 (two) times daily with a meal., Disp: 180 tablet, Rfl: 3   Results for orders placed or performed in visit on 07/21/24  CBC with Differential/Platelet   Collection Time: 07/21/24  8:09 AM  Result Value Ref Range   WBC 5.7 3.8 - 10.8 Thousand/uL   RBC 5.34 (H) 3.80 - 5.10 Million/uL    Hemoglobin 11.0 (L) 11.7 - 15.5 g/dL   HCT 62.7 64.9 - 54.9 %   MCV 69.7 (L) 80.0 - 100.0 fL   MCH 20.6 (L) 27.0 - 33.0 pg   MCHC 29.6 (L) 32.0 - 36.0 g/dL   RDW 83.9 (H) 88.9 - 84.9 %   Platelets  255 140 - 400 Thousand/uL   MPV 9.9 7.5 - 12.5 fL   Neutro Abs 1,710 1,500 - 7,800 cells/uL   Absolute Lymphocytes 3,124 850 - 3,900 cells/uL   Absolute Monocytes 513 200 - 950 cells/uL   Eosinophils Absolute 331 15 - 500 cells/uL   Basophils Absolute 23 0 - 200 cells/uL   Neutrophils Relative % 30 %   Total Lymphocyte 54.8 %   Monocytes Relative 9.0 %   Eosinophils Relative 5.8 %   Basophils Relative 0.4 %  COMPLETE METABOLIC PANEL WITH GFR   Collection Time: 07/21/24  8:09 AM  Result Value Ref Range   Glucose, Bld 100 (H) 65 - 99 mg/dL   BUN 27 (H) 7 - 25 mg/dL   Creat 8.97 (H) 9.39 - 0.95 mg/dL   BUN/Creatinine Ratio 26 (H) 6 - 22 (calc)   Sodium 134 (L) 135 - 146 mmol/L   Potassium 4.4 3.5 - 5.3 mmol/L   Chloride 100 98 - 110 mmol/L   CO2 24 20 - 32 mmol/L   Calcium  9.6 8.6 - 10.4 mg/dL   Total Protein 7.7 6.1 - 8.1 g/dL   Albumin 4.3 3.6 - 5.1 g/dL   Globulin 3.4 1.9 - 3.7 g/dL (calc)   AG Ratio 1.3 1.0 - 2.5 (calc)   Total Bilirubin 0.7 0.2 - 1.2 mg/dL   Alkaline phosphatase (APISO) 73 37 - 153 U/L   AST 40 (H) 10 - 35 U/L   ALT 26 6 - 29 U/L  Hemoglobin A1c   Collection Time: 07/21/24  8:09 AM  Result Value Ref Range   Hgb A1c MFr Bld 8.3 (H) <5.7 %   Mean Plasma Glucose 192 mg/dL   eAG (mmol/L) 89.3 mmol/L  CBC MORPHOLOGY   Collection Time: 07/21/24  8:09 AM  Result Value Ref Range   CBC MORPHOLOGY  NORMAL  Microalbumin / creatinine urine ratio   Collection Time: 07/21/24  9:01 AM  Result Value Ref Range   Creatinine, Urine 83 20 - 275 mg/dL   Microalb, Ur 1.5 mg/dL   Microalb Creat Ratio 18 <30 mg/g creat      Assessment & Plan:   Problem List Items Addressed This Visit     Chronic gout of multiple sites   Essential (primary) hypertension   Relevant  Medications   atorvastatin  (LIPITOR) 20 MG tablet   lisinopril  (ZESTRIL ) 10 MG tablet   History of cerebrovascular accident (CVA) with residual deficit   HLD (hyperlipidemia)   Relevant Medications   atorvastatin  (LIPITOR) 20 MG tablet   lisinopril  (ZESTRIL ) 10 MG tablet   Stage 3a chronic kidney disease (HCC)   Urinary incontinence   Relevant Orders   For home use only DME Other see comment   Other Visit Diagnoses       Controlled type 2 diabetes mellitus with diabetic neuropathy, without long-term current use of insulin  (HCC)    -  Primary   Relevant Medications   atorvastatin  (LIPITOR) 20 MG tablet   lisinopril  (ZESTRIL ) 10 MG tablet   metFORMIN  (GLUCOPHAGE -XR) 750 MG 24 hr tablet     Flu vaccine need       Relevant Orders   Flu vaccine HIGH DOSE PF(Fluzone  Trivalent) (Completed)     Nausea and vomiting, unspecified vomiting type       Relevant Medications   ondansetron  (ZOFRAN -ODT) 4 MG disintegrating tablet     Diabetic peripheral neuropathy (HCC)       Relevant Medications   atorvastatin  (LIPITOR) 20 MG  tablet   lisinopril  (ZESTRIL ) 10 MG tablet   metFORMIN  (GLUCOPHAGE -XR) 750 MG 24 hr tablet     Incontinence of feces with fecal urgency       Relevant Orders   For home use only DME Other see comment     Long term current use of oral hypoglycemic drug            Type 2 diabetes mellitus HbA1c improved to 8.3% but remains above target. Metformin  effective but may cause bowel urgency. - Continue metformin  XR 750 mg twice daily. - Monitor blood glucose levels regularly. - Consider reducing metformin  dose if bowel issues worsen.  Nausea Vomiting - improved Recent episodes with nausea vomiting now improving Rx Zofran  ODT AS NEEDED  Not related to her chronic GERD, no longer on PPI  Urinary and fecal incontinence Mixed urinary incontinence with urge incontinence and stress incontinence Fecal Urge incontinence Incontinence due to delayed restroom access, possibly  exacerbated by medication. - Order incontinence supplies for up to four changes per day through [facility]. - Advise to monitor and adjust metformin  if bowel control becomes more difficult.  Medical necessity for incontinence supplies to avoid urine and fecal soiling that would increase risk of UTI and infection of skin and other complications that could impact her health due to limitations on hygiene. Due to her physical mobility decline and unpredictable nature of her incontinence, she would greatly benefit from incontinence supplies and it would improve her quality of life.  Iron deficiency anemia Chronic, stable anemia. Iron levels improved, hemoglobin at 11, best in three years. - Continue current management and monitor iron levels.  Unspecified Tremor Generalized tremor sensation upper and lower extremities, triggered usually Tremors likely due to muscle weakness. No medication changes due to side effects. - Encourage regular physical activity to maintain muscle strength.  Hypertension Blood pressure well-controlled at 132/74 mmHg. - Continue current antihypertensive regimen.  Hyperlipidemia Continue current management.  CKD-IIIa Kidney function stable, no acute issues. - Continue monitoring kidney function with regular labs.      Orders Placed This Encounter  Procedures   For home use only DME Other see comment    Urinary Incontinence supplies Depends, Pads, change up to 4 times per day for urinary and fecal urge incontinence, N39.6, R15.2    Length of Need:   12 Months   Flu vaccine HIGH DOSE PF(Fluzone  Trivalent)    Meds ordered this encounter  Medications   ondansetron  (ZOFRAN -ODT) 4 MG disintegrating tablet    Sig: Take 1 tablet (4 mg total) by mouth every 8 (eight) hours as needed for nausea or vomiting.    Dispense:  30 tablet    Refill:  0   atorvastatin  (LIPITOR) 20 MG tablet    Sig: Take 1 tablet (20 mg total) by mouth daily.    Dispense:  90 tablet     Refill:  3    Add future refills   lisinopril  (ZESTRIL ) 10 MG tablet    Sig: Take 1 tablet (10 mg total) by mouth daily.    Dispense:  90 tablet    Refill:  3    Add future refills   metFORMIN  (GLUCOPHAGE -XR) 750 MG 24 hr tablet    Sig: Take 1 tablet (750 mg total) by mouth 2 (two) times daily with a meal.    Dispense:  180 tablet    Refill:  3    Add future refills    Follow up plan: Return for 6 month fasting lab >  1 week later Follow-up Lab results, DM, Anemia.  Future labs ordered for 01/31/25   Marsa Officer, DO [facility] 08/02/2024, 11:14 AM

## 2024-08-09 ENCOUNTER — Encounter: Payer: Self-pay | Admitting: Family Medicine

## 2025-01-31 ENCOUNTER — Other Ambulatory Visit

## 2025-02-07 ENCOUNTER — Ambulatory Visit: Admitting: Family Medicine

## 2025-06-17 ENCOUNTER — Ambulatory Visit

## 2025-06-22 ENCOUNTER — Ambulatory Visit
